# Patient Record
Sex: Female | Born: 1935
Health system: Southern US, Community
[De-identification: ages and names within clinical notes are randomized; demographics above are authoritative.]

## PROBLEM LIST (undated history)

## (undated) DIAGNOSIS — I10 Essential (primary) hypertension: Secondary | ICD-10-CM

## (undated) DIAGNOSIS — K589 Irritable bowel syndrome without diarrhea: Secondary | ICD-10-CM

## (undated) DIAGNOSIS — N189 Chronic kidney disease, unspecified: Secondary | ICD-10-CM

## (undated) DIAGNOSIS — F419 Anxiety disorder, unspecified: Secondary | ICD-10-CM

## (undated) DIAGNOSIS — G252 Other specified forms of tremor: Secondary | ICD-10-CM

## (undated) DIAGNOSIS — F329 Major depressive disorder, single episode, unspecified: Secondary | ICD-10-CM

## (undated) DIAGNOSIS — F32A Depression, unspecified: Secondary | ICD-10-CM

## (undated) DIAGNOSIS — E559 Vitamin D deficiency, unspecified: Secondary | ICD-10-CM

## (undated) DIAGNOSIS — E785 Hyperlipidemia, unspecified: Secondary | ICD-10-CM

## (undated) DIAGNOSIS — F0390 Unspecified dementia without behavioral disturbance: Secondary | ICD-10-CM

## (undated) DIAGNOSIS — G459 Transient cerebral ischemic attack, unspecified: Secondary | ICD-10-CM

## (undated) HISTORY — DX: Anxiety disorder, unspecified: F41.9

## (undated) HISTORY — DX: Depression, unspecified: F32.A

## (undated) HISTORY — DX: Vitamin D deficiency, unspecified: E55.9

## (undated) HISTORY — DX: Major depressive disorder, single episode, unspecified: F32.9

## (undated) HISTORY — DX: Essential (primary) hypertension: I10

## (undated) HISTORY — DX: Hyperlipidemia, unspecified: E78.5

## (undated) HISTORY — DX: Chronic kidney disease, unspecified: N18.9

## (undated) HISTORY — PX: OTHER SURGICAL HISTORY: SHX169

## (undated) HISTORY — DX: Other specified forms of tremor: G25.2

## (undated) HISTORY — DX: Irritable bowel syndrome, unspecified: K58.9

---

## 2007-08-08 DIAGNOSIS — M4800 Spinal stenosis, site unspecified: Secondary | ICD-10-CM | POA: Insufficient documentation

## 2010-08-08 DIAGNOSIS — F329 Major depressive disorder, single episode, unspecified: Secondary | ICD-10-CM | POA: Insufficient documentation

## 2010-08-08 DIAGNOSIS — I10 Essential (primary) hypertension: Secondary | ICD-10-CM | POA: Insufficient documentation

## 2010-08-08 DIAGNOSIS — F419 Anxiety disorder, unspecified: Secondary | ICD-10-CM | POA: Insufficient documentation

## 2010-08-08 DIAGNOSIS — K589 Irritable bowel syndrome without diarrhea: Secondary | ICD-10-CM | POA: Insufficient documentation

## 2010-09-20 DIAGNOSIS — E559 Vitamin D deficiency, unspecified: Secondary | ICD-10-CM | POA: Insufficient documentation

## 2011-03-04 DIAGNOSIS — Z8673 Personal history of transient ischemic attack (TIA), and cerebral infarction without residual deficits: Secondary | ICD-10-CM | POA: Insufficient documentation

## 2011-04-16 DIAGNOSIS — E785 Hyperlipidemia, unspecified: Secondary | ICD-10-CM | POA: Insufficient documentation

## 2011-09-17 DIAGNOSIS — I6529 Occlusion and stenosis of unspecified carotid artery: Secondary | ICD-10-CM | POA: Insufficient documentation

## 2011-09-17 DIAGNOSIS — I6523 Occlusion and stenosis of bilateral carotid arteries: Secondary | ICD-10-CM | POA: Insufficient documentation

## 2012-02-19 DIAGNOSIS — G252 Other specified forms of tremor: Secondary | ICD-10-CM | POA: Insufficient documentation

## 2012-05-20 DIAGNOSIS — K264 Chronic or unspecified duodenal ulcer with hemorrhage: Secondary | ICD-10-CM | POA: Insufficient documentation

## 2012-11-19 DIAGNOSIS — G250 Essential tremor: Secondary | ICD-10-CM | POA: Insufficient documentation

## 2012-11-19 DIAGNOSIS — E539 Vitamin B deficiency, unspecified: Secondary | ICD-10-CM | POA: Insufficient documentation

## 2012-12-04 DIAGNOSIS — L309 Dermatitis, unspecified: Secondary | ICD-10-CM | POA: Insufficient documentation

## 2013-03-17 DIAGNOSIS — L659 Nonscarring hair loss, unspecified: Secondary | ICD-10-CM | POA: Insufficient documentation

## 2013-08-22 ENCOUNTER — Emergency Department (HOSPITAL_BASED_OUTPATIENT_CLINIC_OR_DEPARTMENT_OTHER): Admission: EM | Admit: 2013-08-22 | Payer: Self-pay | Source: Home / Self Care

## 2013-11-30 DIAGNOSIS — N183 Chronic kidney disease, stage 3 (moderate): Secondary | ICD-10-CM

## 2013-11-30 DIAGNOSIS — N1831 Chronic kidney disease, stage 3a: Secondary | ICD-10-CM | POA: Insufficient documentation

## 2014-05-27 ENCOUNTER — Encounter: Payer: Self-pay | Admitting: Neurology

## 2014-06-20 DIAGNOSIS — K529 Noninfective gastroenteritis and colitis, unspecified: Secondary | ICD-10-CM | POA: Insufficient documentation

## 2014-07-05 ENCOUNTER — Other Ambulatory Visit: Payer: TRICARE For Life (TFL)

## 2014-07-05 ENCOUNTER — Encounter: Payer: Self-pay | Admitting: Neurology

## 2014-07-05 ENCOUNTER — Ambulatory Visit (INDEPENDENT_AMBULATORY_CARE_PROVIDER_SITE_OTHER): Payer: Medicare Other | Admitting: Neurology

## 2014-07-05 VITALS — HR 68 | Ht 65.6 in | Wt 124.0 lb

## 2014-07-05 DIAGNOSIS — E538 Deficiency of other specified B group vitamins: Secondary | ICD-10-CM | POA: Diagnosis not present

## 2014-07-05 DIAGNOSIS — G252 Other specified forms of tremor: Secondary | ICD-10-CM | POA: Diagnosis not present

## 2014-07-05 DIAGNOSIS — G609 Hereditary and idiopathic neuropathy, unspecified: Secondary | ICD-10-CM

## 2014-07-05 DIAGNOSIS — I6523 Occlusion and stenosis of bilateral carotid arteries: Secondary | ICD-10-CM

## 2014-07-05 NOTE — Progress Notes (Signed)
Samantha Crane was seen today in neurologic consultation at the request of Bill Salinas I, NP.    The patient is a 81 y.o. year old female with a history of orthostatic tremor and panic attacks.  The records that were made available to me were reviewed.  Pt previously saw [facility] and was last seen in 09/2013.    She has also been seen at [facility].    She states that if she stands up for 10 seconds and if she stands up straight she will have tremor in both legs.  If she stood still, she will have tremor in both hands and then will have face tremor but if she sits down the sx's go away almost immediately.  She states that if she walks it goes away.  Pt states that she was previously told that it was from spinal stenosis.  She then saw Dr. Estella Husk at [facility] and he told her that this was orthostatic tremor.  She really does not want treatment for this.  Independent of the above, her right leg aches in the AM and after it resolves, she will get "little shocks" in various places in the body, both arms and legs.  "I don't want any medication for the pricks but are there any exercises I can do?"   No falls but balance is not good.  She is having more trouble getting off the ground with gardening. She is on gabapentin and has been on it for years (2006-2007) ever since a sx for spinal stenosis.  She is on 300 mg tid.  She takes it at AM, lunch and bedtime.  She is not DM.    Record review: The patient had an episode in 2013 that was felt to be a TIA.  Her right hand was clumsy and her body was listing to the right.  She did not recall events for about 10 minutes according to the records (she only states today that her body was listing to the right) and she is completely normal after 30 minutes.  It was felt that this likely represented a TIA and she was placed on Aggrenox.  She states today that this was changed to Plavix after she had a bleeding ulcer as they did not want her  on aspirin component.  She states that she had carotids done and she had stenosis of 50% bilaterally, but notes indicate that she had repeat Dopplers after that that indicated 1-39% stenosis bilaterally.  She does not necessarily remember that.  Her records from [facility] indicate that she had symptoms consistent with panic attack in September, 2015 and not TIA and was referred to behavioral health.     ALLERGIES:   Allergies  Allergen Reactions  . Amoxicillin   . Sulfa Antibiotics     CURRENT MEDICATIONS:  Outpatient Encounter Prescriptions as of 07/05/2014  Medication Sig  . atorvastatin (LIPITOR) 10 MG tablet Take 10 mg by mouth daily.  . Calcium Carbonate-Vitamin D (CALCIUM + D PO) Take by mouth daily.  . cetirizine (ZYRTEC) 10 MG tablet Take 10 mg by mouth daily.  . citalopram (CELEXA) 20 MG tablet Take 20 mg by mouth daily.  . clopidogrel (PLAVIX) 75 MG tablet Take 75 mg by mouth daily.  . cyanocobalamin 500 MCG tablet Take 500 mcg by mouth daily.  Marland Kitchen estrogens, conjugated, (PREMARIN) 0.625 MG tablet Take 0.625 mg by mouth daily. Take daily for 21 days then do not take for 7 days.  . ferrous  sulfate 325 (65 FE) MG tablet Take 325 mg by mouth daily with breakfast.  . fluticasone (FLONASE) 50 MCG/ACT nasal spray Place into both nostrils daily.  Marland Kitchen gabapentin (NEURONTIN) 300 MG capsule Take 300 mg by mouth 3 (three) times daily.  Marland Kitchen lisinopril (PRINIVIL,ZESTRIL) 20 MG tablet Take 20 mg by mouth daily.  . Multiple Vitamin (MULTIVITAMIN) tablet Take 1 tablet by mouth daily.  . pantoprazole (PROTONIX) 40 MG tablet Take 40 mg by mouth daily.  . [DISCONTINUED] ALPRAZolam (XANAX) 0.25 MG tablet Take 0.25 mg by mouth at bedtime as needed for anxiety.   No facility-administered encounter medications on file as of 07/05/2014.    PAST MEDICAL HISTORY:   Past Medical History  Diagnosis Date  . Hypertension   . Chronic kidney disease     pt denies, states chronic bladder infections  .  Orthostatic tremor   . Vitamin D deficiency   . Hyperlipidemia   . Depression   . Anxiety   . IBS (irritable bowel syndrome)   . Hyperlipidemia     PAST SURGICAL HISTORY:   Past Surgical History  Procedure Laterality Date  . Lumbar spinal stenosis      SOCIAL HISTORY:   History   Social History  . Marital Status: Married    Spouse Name: N/A  . Number of Children: N/A  . Years of Education: N/A   Occupational History  . retired     Administrator, part time   Social History Main Topics  . Smoking status: Former Games developer  . Smokeless tobacco: Not on file     Comment: quit 30 years ago; social smoker  . Alcohol Use: 0.0 oz/week    0 Standard drinks or equivalent per week     Comment: rarely  . Drug Use: No  . Sexual Activity: Not on file   Other Topics Concern  . Not on file   Social History Narrative    FAMILY HISTORY:   Family Status  Relation Status Death Age  . Father Deceased     MI, heart disease  . Mother Deceased     stroke  . Brother Alive     CVA  . Child Alive     3, alive and well  . Child Deceased     2, miscarriages    ROS:  A complete 10 system review of systems was obtained and was unremarkable apart from what is mentioned above.  PHYSICAL EXAMINATION:    VITALS:   Filed Vitals:   07/05/14 1418  Pulse: 68  Height: 5' 5.6" (1.666 m)  Weight: 124 lb (56.246 kg)  SpO2: 98%    GEN:  Normal appears female in no acute distress.  Appears stated age. HEENT:  Normocephalic, atraumatic. The mucous membranes are moist. The superficial temporal arteries are without ropiness or tenderness. Cardiovascular: Regular rate and rhythm. Lungs: Clear to auscultation bilaterally. Neck/Heme: There are no carotid bruits noted bilaterally.  NEUROLOGICAL: Orientation:  The patient is alert and oriented x 3.  Fund of knowledge is appropriate.  Recent and remote memory intact.  Attention span and concentration normal.  Repeats and names without  difficulty. Cranial nerves: There is good facial symmetry. The pupils are equal round and reactive to light bilaterally. Fundoscopic exam reveals clear disc margins bilaterally. Extraocular muscles are intact and visual fields are full to confrontational testing. Speech is fluent and clear. Soft palate rises symmetrically and there is no tongue deviation. Hearing is intact to conversational tone. Tone: Tone is  good throughout. Sensation: Sensation is intact to light touch and pinprick throughout (facial, trunk, extremities).  Pinprick is decreased distally bilaterally in the lower extremities.  Vibration is markedly decreased in a distal fashion. There is no extinction with double simultaneous stimulation. There is no sensory dermatomal level identified. Coordination:  The patient has no difficulty with RAM's or FNF bilaterally. Motor: Strength is 5/5 in the bilateral upper and lower extremities.  Shoulder shrug is equal and symmetric. There is no pronator drift.  There are no fasciculations noted. DTR's: Deep tendon reflexes are 2/4 at the bilateral biceps, triceps, brachioradialis, patella and  absent at the bilateralachilles.  Plantar responses are downgoing bilaterally. Gait and Station: The patient is able to ambulate without difficulty. the patient has trouble ambulating in a tandem fashion.  She has give her walk on her toes, but has difficulty walking on her heels.  She sways in the Romberg position with eyes closed.     IMPRESSION/PLAN  1.  Mild gait instability  -She does have clinical evidence of a peripheral neuropathy.  She and I discussed safety at length.  Perhaps that is why she has some of the "pricking" that she describes in the morning.  Perhaps it is not aware during the daytime because she takes gabapentin.  -I did some lab work today to rule out any reversible causes including B12, TSH, SPEP/UPEP with immunofixation.  Patient states that she has already had basic lab work done,  including a chemistry and glucose.  She states that these were normal.  Her records indicate that she does have a history of B12 deficiency, so I will check that again today but it also looks like she has been getting injections for this. 2.  History of carotid stenosis  -I will get a carotid ultrasound. 3.  Orthostatic tremor  -I did not see any tremor at all today.  Based on her history alone, I am not sure that this represents orthostatic tremor, but nonetheless she wants no treatment for this.  She and I discussed that treatments for orthostatic tremor are incredibly limited and can cause significant side effects, especially of clonazepam is used in her age group.  She really has learned to deal with this over the years by just walking and then the symptoms go away. 4.  Anxiety  -I think much of this is situational.  She spent a significant amount of time talking about being the caregiver for her husband who has Parkinson's disease.  We talked about respite care.  Much greater than 50% of the 45 minute visit was spent in counseling. 5.  F/u prn

## 2014-07-06 NOTE — Progress Notes (Signed)
Note faxed to [phone].

## 2014-07-12 ENCOUNTER — Ambulatory Visit
Admission: RE | Admit: 2014-07-12 | Discharge: 2014-07-12 | Disposition: A | Discharge status: Home / Self Care | Payer: Medicare Other | Source: Ambulatory Visit | Attending: Neurology | Admitting: Neurology

## 2014-07-12 DIAGNOSIS — E538 Deficiency of other specified B group vitamins: Secondary | ICD-10-CM

## 2014-07-12 DIAGNOSIS — G252 Other specified forms of tremor: Secondary | ICD-10-CM

## 2014-12-15 DIAGNOSIS — J3089 Other allergic rhinitis: Secondary | ICD-10-CM | POA: Diagnosis not present

## 2014-12-16 DIAGNOSIS — J301 Allergic rhinitis due to pollen: Secondary | ICD-10-CM | POA: Diagnosis not present

## 2014-12-26 ENCOUNTER — Ambulatory Visit: Payer: Self-pay

## 2014-12-26 NOTE — Progress Notes (Signed)
Immunotherapy   Patient Details  Name: Samantha Crane MRN: [id number] Date of Birth: November 24, 1929  12/26/2014  Samantha Crane Pt. vials Red 1:100 (tree-grass-dog-cat and mite-weed) [facility] Following schedule: C  Frequency:1 time per week, at .50 q 2 weeks Epi-Pen:available. No problems with last vials. Consent signed and patient instructions given.   Virl SonDamita Nazir Hacker 12/26/2014, 9:52 AM

## 2015-01-06 DIAGNOSIS — J0100 Acute maxillary sinusitis, unspecified: Secondary | ICD-10-CM | POA: Insufficient documentation

## 2015-05-09 DIAGNOSIS — J3089 Other allergic rhinitis: Secondary | ICD-10-CM | POA: Diagnosis not present

## 2015-05-10 DIAGNOSIS — J301 Allergic rhinitis due to pollen: Secondary | ICD-10-CM

## 2015-05-16 ENCOUNTER — Ambulatory Visit: Payer: Medicare Other

## 2015-05-16 NOTE — Progress Notes (Signed)
Immunotherapy   Patient Details  Name: Samantha Crane MRN: [id number] Date of Birth: [birth year]  05/16/2015  Samantha Crane mailed out red 1/100 Mite-weed & Tree-Grass-Dog-Cat Following schedule: C at 0.50 every 2-3 weeks Frequency:1 time per week Epi-Pen:Epi-Pen Available  Consent signed and patient instructions given.  Records recd & had no problems.   Jacqulyn CaneJanet Damario Gillie 05/16/2015, 8:57 AM

## 2015-05-23 ENCOUNTER — Ambulatory Visit: Payer: Medicare Other

## 2015-06-05 DIAGNOSIS — F5101 Primary insomnia: Secondary | ICD-10-CM | POA: Insufficient documentation

## 2015-10-23 DIAGNOSIS — J3089 Other allergic rhinitis: Secondary | ICD-10-CM | POA: Diagnosis not present

## 2015-10-24 DIAGNOSIS — J301 Allergic rhinitis due to pollen: Secondary | ICD-10-CM | POA: Diagnosis not present

## 2015-10-24 NOTE — Progress Notes (Signed)
Immunotherapy   Patient Details  Name: Samantha Crane MRN: [id number] Date of Birth: [birth year]  10/24/2015  Samantha Crane Red 1:100 (Tree-Grass-Dog-Cat and Mite-Weed) @ .10 to start Following schedule: C  Frequency:1 time per week, at .50 Q 3 weeks Epi-Pen:Epi-Pen Available  Consent signed and patient instructions given. [facility]   Antonyo Hinderer 10/24/2015, 10:52 AM

## 2015-11-07 ENCOUNTER — Ambulatory Visit: Payer: Medicare Other

## 2015-11-29 ENCOUNTER — Encounter: Payer: Self-pay | Admitting: Allergy and Immunology

## 2015-11-29 ENCOUNTER — Ambulatory Visit (INDEPENDENT_AMBULATORY_CARE_PROVIDER_SITE_OTHER): Payer: Medicare Other | Admitting: Allergy and Immunology

## 2015-11-29 VITALS — BP 120/76 | HR 83 | Temp 97.9°F | Resp 16 | Ht 65.75 in | Wt 128.3 lb

## 2015-11-29 DIAGNOSIS — J3089 Other allergic rhinitis: Secondary | ICD-10-CM | POA: Diagnosis not present

## 2015-11-29 DIAGNOSIS — R059 Cough, unspecified: Secondary | ICD-10-CM | POA: Insufficient documentation

## 2015-11-29 DIAGNOSIS — Z91018 Allergy to other foods: Secondary | ICD-10-CM

## 2015-11-29 DIAGNOSIS — R05 Cough: Secondary | ICD-10-CM

## 2015-11-29 MED ORDER — EPINEPHRINE 0.3 MG/0.3ML IJ SOAJ: 0.3000 mg | Freq: Once | INTRAMUSCULAR | 2 refills | Status: AC

## 2015-11-29 MED ORDER — ALBUTEROL SULFATE HFA 108 (90 BASE) MCG/ACT IN AERS: 2.0000 | INHALATION_SPRAY | RESPIRATORY_TRACT | 1 refills | Status: DC | PRN

## 2015-11-29 MED ORDER — IPRATROPIUM BROMIDE 0.06 % NA SOLN: 2.0000 | Freq: Three times a day (TID) | NASAL | 5 refills | Status: DC

## 2015-11-29 MED ORDER — IPRATROPIUM BROMIDE 0.06 % NA SOLN
2.0000 | Freq: Three times a day (TID) | NASAL | Status: DC
Start: 2015-11-29 — End: 2015-11-29

## 2015-11-29 NOTE — Assessment & Plan Note (Signed)
The patient's history and physical examination suggest upper airway cough syndrome.  Spirometry today does not demonstrate significant reversibility. We will aggressively treat postnasal drainage and evaluate results.  Treatment plan as outlined below for allergic rhinitis.  If the coughing persists or progresses despite this plan, we will evaluate further.

## 2015-11-29 NOTE — Progress Notes (Signed)
Follow-up Note  RE: Samantha Crane [place] MRN: [id number] DOB: 05/02/1929 Date of Office Visit: 11/29/2015  Primary care provider: Vernon Preyurbyfill, Holly I, NP Referring provider: Bill Salinasurbyfill, Holly I, NP  History of present illness: Samantha Crane Stur[place] is a 82 y.o. female allergic rhinitis and food allergies presenting today for sick visit.  She was last seen in this clinic in May 2015  by Dr. Clydie BraunBhatti who has since left the practice.  She complains that she has recently been experiencing "constant" rhinorrhea, thick postnasal drainage, nasal congestion, as well as occasional sinus pressure and ear pressure.  In addition, she has been experiencing a persistent cough.  She reports that she is treated with antibiotics for sinus infections 3 times per year on average.  She has not been experiencing fevers, chills, or discolored mucus production.   Assessment and plan: Cough The patient's history and physical examination suggest upper airway cough syndrome.  Spirometry today does not demonstrate significant reversibility. We will aggressively treat postnasal drainage and evaluate results.  Treatment plan as outlined below for allergic rhinitis.  If the coughing persists or progresses despite this plan, we will evaluate further.  Allergic rhinitis  A prescription has been provided for ipratropium 0.06% nasal spray, 2 sprays per nostril 2 or 3 times daily as needed.  Nasal saline lavage (NeilMed) as needed has been recommended prior to medicated nasal sprays and as needed along with instructions for proper administration.  For thick post nasal drainage, add guaifenesin 600 mg (Mucinex)  twice daily as needed with adequate hydration as discussed.  History of food allergy  She had apparently developed hives in association on one occasion while consuming crabs in the past, however she apparently consumes other shellfish on a regular basis without adverse symptoms. Continue avoidance of crabs and have access to  epinephrine autoinjectors in case of accidental ingestion followed by systemic symptoms.  She chooses to continue consumption of other shellfish.   Meds ordered this encounter  Medications  . albuterol (PROVENTIL HFA;VENTOLIN HFA) 108 (90 Base) MCG/ACT inhaler    Sig: Inhale 2 puffs into the lungs every 4 (four) hours as needed.    Dispense:  1 Inhaler    Refill:  1  . ipratropium (ATROVENT) 0.06 % nasal spray 2 spray  . EPINEPHrine (EPIPEN 2-PAK) 0.3 mg/0.3 mL IJ SOAJ injection    Sig: Inject 0.3 mLs (0.3 mg total) into the muscle once.    Dispense:  2 Device    Refill:  2    Diagnostics: Spirometry reveals an FVC of 2.18 L (81% predicted) and an FEV1 of 1.38 L (69% predicted) without significant post bronchodilator improvement.  Mild airways obstruction without reversibility.  Please see scanned spirometry results for details.    Physical examination: Blood pressure 120/76, pulse 83, temperature 97.9 F (36.6 C), temperature source Oral, resp. rate 16, height 5' 5.75" (1.67 m), weight 128 lb 4.9 oz (58.2 kg), SpO2 97 %.  General: Alert, interactive, in no acute distress. HEENT: TMs pearly gray, turbinates edematous with clear discharge, post-pharynx erythematous. Neck: Supple without lymphadenopathy. Lungs: Mildly decreased breath sounds bilaterally without wheezing, rhonchi or rales. CV: Normal S1, S2 without murmurs. Skin: Warm and dry, without lesions or rashes.  The following portions of the patient's history were reviewed and updated as appropriate: allergies, current medications, past family history, past medical history, past social history, past surgical history and problem list.    Medication List       Accurate as of 11/29/15  1:38 PM.  Always use your most recent med list.          albuterol 108 (90 Base) MCG/ACT inhaler Commonly known as:  PROVENTIL HFA;VENTOLIN HFA Inhale 2 puffs into the lungs every 4 (four) hours as needed.   atorvastatin 10 MG  tablet Commonly known as:  LIPITOR Take 10 mg by mouth daily.   buPROPion 150 MG 24 hr tablet Commonly known as:  WELLBUTRIN XL Take 150 mg by mouth.   CALCIUM + D PO Take by mouth daily.   calcium-vitamin D 250-125 MG-UNIT tablet Commonly known as:  OSCAL WITH D Take by mouth.   cetirizine 10 MG tablet Commonly known as:  ZYRTEC Take 10 mg by mouth daily.   cholestyramine 4 g packet Commonly known as:  QUESTRAN MIX ONE PACKET AND DRINK TWICE A DAY WITH MEALS   citalopram 20 MG tablet Commonly known as:  CELEXA Take 20 mg by mouth daily.   clopidogrel 75 MG tablet Commonly known as:  PLAVIX Take 75 mg by mouth daily.   cyanocobalamin 500 MCG tablet Take 500 mcg by mouth daily.   EPINEPHrine 0.3 mg/0.3 mL Soaj injection Commonly known as:  EPIPEN 2-PAK Inject 0.3 mLs (0.3 mg total) into the muscle once.   estrogens (conjugated) 0.625 MG tablet Commonly known as:  PREMARIN Take 0.625 mg by mouth daily. Take daily for 21 days then do not take for 7 days.   ferrous sulfate 325 (65 FE) MG tablet Take 325 mg by mouth daily with breakfast.   fluticasone 27.5 MCG/SPRAY nasal spray Commonly known as:  VERAMYST Place into the nose.   fluticasone 50 MCG/ACT nasal spray Commonly known as:  FLONASE Place into both nostrils daily.   gabapentin 300 MG capsule Commonly known as:  NEURONTIN Take 300 mg by mouth 3 (three) times daily.   lisinopril 20 MG tablet Commonly known as:  PRINIVIL,ZESTRIL Take 20 mg by mouth daily.   montelukast 10 MG tablet Commonly known as:  SINGULAIR Take 10 mg by mouth at bedtime.   MULTI-VITAMINS Tabs Take by mouth.   NON FORMULARY Allergy injection   pantoprazole 40 MG tablet Commonly known as:  PROTONIX Take 40 mg by mouth daily.   PREMARIN vaginal cream Generic drug:  conjugated estrogens INSERT 0.5 GRAM INTO THE VAGINA THREE TIMES A WEEK   zaleplon 10 MG capsule Commonly known as:  SONATA TAKE ONE CAPSULE BY MOUTH AT  BEDTIME AS NEEDED       Allergies  Allergen Reactions  . Prednisone     Other reaction(s): Other (See Comments) Manic tendencies  . Amoxicillin   . Sulfa Antibiotics    Review of systems: Review of systems negative except as noted in HPI / PMHx or noted below: Constitutional: Negative.  HENT: Negative.   Eyes: Negative.  Respiratory: Negative.   Cardiovascular: Negative.  Gastrointestinal: Negative.  Genitourinary: Negative.  Musculoskeletal: Negative.  Neurological: Negative.  Endo/Heme/Allergies: Negative.  Cutaneous: Negative.  Past Medical History:  Diagnosis Date  . Anxiety   . Chronic kidney disease    pt denies, states chronic bladder infections  . Depression   . Hyperlipidemia   . Hyperlipidemia   . Hypertension   . IBS (irritable bowel syndrome)   . Orthostatic tremor   . Vitamin D deficiency     History reviewed. No pertinent family history.  Social History   Social History  . Marital status: Married    Spouse name: N/A  . Number of children: N/A  . Years  of education: N/A   Occupational History  . retired     Administrator, part time   Social History Main Topics  . Smoking status: Former Games developer  . Smokeless tobacco: Never Used     Comment: quit 30 years ago; social smoker  . Alcohol use 0.0 oz/week     Comment: rarely  . Drug use: No  . Sexual activity: Not on file   Other Topics Concern  . Not on file   Social History Narrative  . No narrative on file    I appreciate the opportunity to take part in Delena's care. Please do not hesitate to contact me with questions.  Sincerely,   R. Jorene Guest, MD

## 2015-11-29 NOTE — Addendum Note (Signed)
Addended by: Maryjean MornFREEMAN, Marcellino Fidalgo D on: 11/29/2015 02:36 PM   Modules accepted: Orders

## 2015-11-29 NOTE — Assessment & Plan Note (Signed)
   She had apparently developed hives in association on one occasion while consuming crabs in the past, however she apparently consumes other shellfish on a regular basis without adverse symptoms. Continue avoidance of crabs and have access to epinephrine autoinjectors in case of accidental ingestion followed by systemic symptoms.  She chooses to continue consumption of other shellfish.

## 2015-11-29 NOTE — Patient Instructions (Addendum)
Cough The patient's history and physical examination suggest upper airway cough syndrome.  Spirometry today does not demonstrate significant reversibility. We will aggressively treat postnasal drainage and evaluate results.  Treatment plan as outlined below for allergic rhinitis.  If the coughing persists or progresses despite this plan, we will evaluate further.  Allergic rhinitis  A prescription has been provided for ipratropium 0.06% nasal spray, 2 sprays per nostril 2 or 3 times daily as needed.  Nasal saline lavage (NeilMed) as needed has been recommended prior to medicated nasal sprays and as needed along with instructions for proper administration.  For thick post nasal drainage, add guaifenesin 600 mg (Mucinex)  twice daily as needed with adequate hydration as discussed.  History of food allergy  She had apparently developed hives in association on one occasion while consuming crabs in the past, however she apparently consumes other shellfish on a regular basis without adverse symptoms. Continue avoidance of crabs and have access to epinephrine autoinjectors in case of accidental ingestion followed by systemic symptoms.  She chooses to continue consumption of other shellfish.   Return in about 4 months (around 03/28/2016), or if symptoms worsen or fail to improve.

## 2015-11-29 NOTE — Assessment & Plan Note (Signed)
   A prescription has been provided for ipratropium 0.06% nasal spray, 2 sprays per nostril 2 or 3 times daily as needed.  Nasal saline lavage (NeilMed) as needed has been recommended prior to medicated nasal sprays and as needed along with instructions for proper administration.  For thick post nasal drainage, add guaifenesin 600 mg (Mucinex)  twice daily as needed with adequate hydration as discussed.

## 2016-02-09 DIAGNOSIS — J3089 Other allergic rhinitis: Secondary | ICD-10-CM | POA: Insufficient documentation

## 2016-02-09 DIAGNOSIS — Z9109 Other allergy status, other than to drugs and biological substances: Secondary | ICD-10-CM | POA: Insufficient documentation

## 2016-03-21 ENCOUNTER — Emergency Department (HOSPITAL_COMMUNITY)
Admission: EM | Admit: 2016-03-21 | Discharge: 2016-03-22 | Disposition: A | Discharge status: Home / Self Care | Payer: Medicare Other | Attending: Emergency Medicine | Admitting: Emergency Medicine

## 2016-03-21 ENCOUNTER — Encounter (HOSPITAL_COMMUNITY): Payer: Self-pay

## 2016-03-21 ENCOUNTER — Emergency Department (HOSPITAL_COMMUNITY): Payer: Medicare Other

## 2016-03-21 DIAGNOSIS — N189 Chronic kidney disease, unspecified: Secondary | ICD-10-CM | POA: Insufficient documentation

## 2016-03-21 DIAGNOSIS — F410 Panic disorder [episodic paroxysmal anxiety] without agoraphobia: Secondary | ICD-10-CM | POA: Diagnosis not present

## 2016-03-21 DIAGNOSIS — Z87891 Personal history of nicotine dependence: Secondary | ICD-10-CM | POA: Insufficient documentation

## 2016-03-21 DIAGNOSIS — I129 Hypertensive chronic kidney disease with stage 1 through stage 4 chronic kidney disease, or unspecified chronic kidney disease: Secondary | ICD-10-CM | POA: Insufficient documentation

## 2016-03-21 LAB — CBC WITH DIFFERENTIAL/PLATELET
BASOS PCT: 1 %
Basophils Absolute: 0 10*3/uL (ref 0.0–0.1)
EOS ABS: 0.1 10*3/uL (ref 0.0–0.7)
Eosinophils Relative: 2 %
HEMATOCRIT: 39.7 % (ref 36.0–46.0)
HEMOGLOBIN: 13.7 g/dL (ref 12.0–15.0)
LYMPHS ABS: 2.1 10*3/uL (ref 0.7–4.0)
Lymphocytes Relative: 28 %
MCH: 30.5 pg (ref 26.0–34.0)
MCHC: 34.5 g/dL (ref 30.0–36.0)
MCV: 88.4 fL (ref 78.0–100.0)
MONOS PCT: 10 %
Monocytes Absolute: 0.8 10*3/uL (ref 0.1–1.0)
NEUTROS ABS: 4.6 10*3/uL (ref 1.7–7.7)
NEUTROS PCT: 59 %
Platelets: 218 10*3/uL (ref 150–400)
RBC: 4.49 MIL/uL (ref 3.87–5.11)
RDW: 13.4 % (ref 11.5–15.5)
WBC: 7.6 10*3/uL (ref 4.0–10.5)

## 2016-03-21 LAB — I-STAT TROPONIN, ED
TROPONIN I, POC: 0.01 ng/mL (ref 0.00–0.08)
Troponin i, poc: 0 ng/mL (ref 0.00–0.08)

## 2016-03-21 LAB — BASIC METABOLIC PANEL
Anion gap: 8 (ref 5–15)
BUN: 20 mg/dL (ref 6–20)
CHLORIDE: 97 mmol/L — AB (ref 101–111)
CO2: 28 mmol/L (ref 22–32)
Calcium: 9.4 mg/dL (ref 8.9–10.3)
Creatinine, Ser: 0.87 mg/dL (ref 0.44–1.00)
GFR calc non Af Amer: 59 mL/min — ABNORMAL LOW (ref >60)
Glucose, Bld: 108 mg/dL — ABNORMAL HIGH (ref 65–99)
POTASSIUM: 4.6 mmol/L (ref 3.5–5.1)
SODIUM: 133 mmol/L — AB (ref 135–145)

## 2016-03-21 NOTE — ED Triage Notes (Signed)
Patient states having a panic attack today.  Patient states being under a lot of stress due to guilt of having to put husband in a nursing home.  Patient states having a frequent panic attacks and disliking taking Xanax.  Patient sees a councilor due to the stress leading to the panic attacks.  Patient states "I wasn't a good enough person to take care of him."

## 2016-03-21 NOTE — ED Provider Notes (Signed)
[facility]-EMERGENCY DEPT Provider Note   CSN: [id number] Arrival date & time: 03/21/16  1540  By signing my name below, I, Samantha Crane, attest that this documentation has been prepared under the direction and in the presence of [name], [name]. Electronically Signed: Alyssa GroveMartin Crane, ED Scribe. 03/21/16. 11:58 PM.   History   Chief Complaint Chief Complaint  Patient presents with  . Panic Attack    The history is provided by the patient and a relative (Daughter). No language interpreter was used.   HPI Comments: Samantha Crane is a 83 y.o. female with PMHx of HLD, HTN, TIA (on plavix) and CKD who presents to the Emergency Department complaining of recurrent, gradual onset, episodic panic attacks for 2 1/2 weeks with the most recent episode today at 3 PM. She has been placed under increased stress after her husband was placed in Hospice a few weeks ago. Pt has [facility] with moderate relief, but states it frequently takes another half of a Xanax tablet for full relief. Reports sxs resolved within 30 minutes of onset. She reports associated shortness of breath, generalized chest pressure, decreased appetite, and tremors. [facility]  [facility] advised to come to the ED for "psych evaluation". Pt denies headaches, acute visual changes, cough, extremity weakness, slurred speech, facial asymmetry/weakness, recent falls or head injuries. Patient denies any symptoms or complaints at this time.   Past Medical History:  Diagnosis Date  . Anxiety   . Chronic kidney disease    pt denies, states chronic bladder infections  . Depression   . Hyperlipidemia   . Hyperlipidemia   . Hypertension   . IBS (irritable bowel syndrome)   . Orthostatic tremor   . Vitamin D deficiency     Patient Active Problem List   Diagnosis Date Noted  . Allergic rhinitis 11/29/2015  . Cough 11/29/2015  . History of food  allergy 11/29/2015    Past Surgical History:  Procedure Laterality Date  . lumbar spinal stenosis      OB History    No data available       Home Medications    Prior to Admission medications   Medication Sig Start Date End Date Taking? Authorizing Provider  albuterol (PROVENTIL HFA;VENTOLIN HFA) 108 (90 Base) MCG/ACT inhaler Inhale 2 puffs into the lungs every 4 (four) hours as needed. 11/29/15   Cristal Fordalph Carter Bobbitt, MD  atorvastatin (LIPITOR) 10 MG tablet Take 10 mg by mouth daily.    Historical Provider, MD  buPROPion (WELLBUTRIN XL) 150 MG 24 hr tablet Take 150 mg by mouth. 09/22/15 09/21/16  Historical Provider, MD  Calcium Carbonate-Vitamin D (CALCIUM + D PO) Take by mouth daily.    Historical Provider, MD  calcium-vitamin D (OSCAL WITH D) 250-125 MG-UNIT tablet Take by mouth.    Historical Provider, MD  cetirizine (ZYRTEC) 10 MG tablet Take 10 mg by mouth daily.    Historical Provider, MD  cholestyramine (QUESTRAN) 4 g packet MIX ONE PACKET AND DRINK TWICE A DAY WITH MEALS 07/17/15   Historical Provider, MD  citalopram (CELEXA) 20 MG tablet Take 20 mg by mouth daily.    Historical Provider, MD  clopidogrel (PLAVIX) 75 MG tablet Take 75 mg by mouth daily.    Historical Provider, MD  conjugated estrogens (PREMARIN) vaginal cream INSERT 0.5 GRAM INTO THE VAGINA THREE TIMES A WEEK 06/24/14   Historical Provider, MD  cyanocobalamin 500 MCG  tablet Take 500 mcg by mouth daily.    Historical Provider, MD  estrogens, conjugated, (PREMARIN) 0.625 MG tablet Take 0.625 mg by mouth daily. Take daily for 21 days then do not take for 7 days.    Historical Provider, MD  ferrous sulfate 325 (65 FE) MG tablet Take 325 mg by mouth daily with breakfast.    Historical Provider, MD  fluticasone (FLONASE) 50 MCG/ACT nasal spray Place into both nostrils daily.    Historical Provider, MD  fluticasone (VERAMYST) 27.5 MCG/SPRAY nasal spray Place into the nose. 05/18/12   Historical Provider, MD  gabapentin  (NEURONTIN) 300 MG capsule Take 300 mg by mouth 3 (three) times daily.    Historical Provider, MD  ipratropium (ATROVENT) 0.06 % nasal spray Place 2 sprays into both nostrils 3 (three) times daily. 11/29/15   Cristal Ford, MD  lisinopril (PRINIVIL,ZESTRIL) 20 MG tablet Take 20 mg by mouth daily.    Historical Provider, MD  montelukast (SINGULAIR) 10 MG tablet Take 10 mg by mouth at bedtime.    Historical Provider, MD  Multiple Vitamin (MULTI-VITAMINS) TABS Take by mouth.    Historical Provider, MD  NON FORMULARY Allergy injection    Historical Provider, MD  pantoprazole (PROTONIX) 40 MG tablet Take 40 mg by mouth daily.    Historical Provider, MD  zaleplon (SONATA) 10 MG capsule TAKE ONE CAPSULE BY MOUTH AT BEDTIME AS NEEDED 05/18/12   Historical Provider, MD    Family History History reviewed. No pertinent family history.  Social History Social History  Substance Use Topics  . Smoking status: Former Games developer  . Smokeless tobacco: Never Used     Comment: quit 30 years ago; social smoker  . Alcohol use No     Comment: rarely   Allergies   Prednisone; Amoxicillin; and Sulfa antibiotics  Review of Systems Review of Systems  Constitutional: Positive for appetite change.  HENT:       No head injuries  Eyes: Negative for visual disturbance.  Respiratory: Positive for shortness of breath.   Cardiovascular: Positive for chest pain.  Neurological: Positive for tremors. Negative for facial asymmetry, speech difficulty, weakness and headaches.  Psychiatric/Behavioral: The patient is nervous/anxious.        +Panic attacks  All other systems reviewed and are negative.   Physical Exam Updated Vital Signs BP 130/69 (BP Location: Right Arm)   Pulse 72   Temp 97.9 F (36.6 C) (Oral)   Resp 18   Ht 5' 5.5" (1.664 m)   Wt 50.8 kg   SpO2 99%   BMI 18.35 kg/m   Physical Exam  Constitutional: She is oriented to person, place, and time. She appears well-developed and well-nourished.  No distress.  HENT:  Head: Normocephalic and atraumatic.  Mouth/Throat: Oropharynx is clear and moist. No oropharyngeal exudate.  Eyes: Conjunctivae and EOM are normal. Pupils are equal, round, and reactive to light. Right eye exhibits no discharge. Left eye exhibits no discharge. No scleral icterus.  Neck: Normal range of motion. Neck supple.  Cardiovascular: Normal rate, regular rhythm, normal heart sounds and intact distal pulses.   Pulmonary/Chest: Effort normal and breath sounds normal. No respiratory distress. She has no wheezes. She has no rales. She exhibits no tenderness.  Abdominal: Soft. Bowel sounds are normal. She exhibits no distension and no mass. There is no tenderness. There is no rebound and no guarding.  Musculoskeletal: Normal range of motion. She exhibits no edema.  No midline C, T, or L tenderness. Full range  of motion of neck and back. Full range of motion of bilateral upper and lower extremities, with 5/5 strength. Sensation intact. 2+ radial and PT pulses. Cap refill <2 seconds. Patient able to stand and ambulate without assistance.    Neurological: She is alert and oriented to person, place, and time. She has normal strength. No cranial nerve deficit or sensory deficit. Coordination normal.  Skin: Skin is warm and dry. Capillary refill takes less than 2 seconds. She is not diaphoretic.  Psychiatric:  Depressed mood  Nursing note and vitals reviewed.   ED Treatments / Results  DIAGNOSTIC STUDIES: Oxygen Saturation is 96% on RA, adequate by my interpretation.    COORDINATION OF CARE: 7:22 PM Discussed treatment plan with pt at bedside which includes consult with attending physician and pt agreed to plan. Pt does not wish to be admitted as an outpatient.  Labs (all labs ordered are listed, but only abnormal results are displayed) Labs Reviewed  BASIC METABOLIC PANEL - Abnormal; Notable for the following:       Result Value   Sodium 133 (*)    Chloride 97 (*)     Glucose, Bld 108 (*)    GFR calc non Af Amer 59 (*)    All other components within normal limits  CBC WITH DIFFERENTIAL/PLATELET  I-STAT TROPOININ, ED  Rosezena Sensor, ED    EKG  EKG Interpretation None       Radiology Dg Chest 2 View  Result Date: 03/21/2016 CLINICAL DATA:  83 y/o  F; chest pain and panic attacks. EXAM: CHEST  2 VIEW COMPARISON:  None available. FINDINGS: Normal cardiac silhouette. Aortic atherosclerosis with calcification. No focal consolidation. Hyperinflated lungs with flattened diaphragms may representing underlying COPD. No pleural effusion or pneumothorax. Bones are unremarkable. IMPRESSION: No acute cardiopulmonary process. Aortic atherosclerosis. Hyperinflated lungs may represent underlying COPD. Electronically Signed   By: Mitzi Hansen M.D.   On: 03/21/2016 20:28    Procedures Procedures (including critical care time)  Medications Ordered in ED Medications - No data to display   Initial Impression / Assessment and Plan / ED Course  I have reviewed the triage vital signs and the nursing notes.  Pertinent labs & imaging results that were available during my care of the patient were reviewed by me and considered in my medical decision making (see chart for details).    Patient presents to the ED with complaint of panic attacks. Patient states she has been having panic attacks almost daily for the past 2 and half weeks. Patient states the panic attacks started after her husband went to hospice. She reports episodes typically last for less than 30 minutes and resolved with Xanax which she was recently prescribed by her primary care provider. Denies any pain or complaints at this time. VSS. Exam unremarkable. No neuro deficits. EKG showed sinus rhythm. Troponin negative. Labs unremarkable. Chest x-ray negative. Discussed pt with Dr. Dalene Seltzer. Patient episodes appear to be consistent with panic attacks related to recent increased stress from her  husband recently going into hospice. Patient has remained asymptomatic while in the ED. Plan to ordered delta troponin for complete cardiac evaluation. Delta troponin negative. I have a low suspicion for ACS, PE, dissection, or other acute cardiac event at this time. Discussed results and plan for discharge with patient. Advised patient to continue taking her Xanax as prescribed as needed for panic attacks. Advised patient to follow up with her primary care provider in the next 3-4 days. Discussed or return precautions.  I personally performed the services described in this documentation, which was scribed in my presence. The recorded information has been reviewed and is accurate.   Final Clinical Impressions(s) / ED Diagnoses   Final diagnoses:  Panic attack    New Prescriptions New Prescriptions   No medications on file     Barrett Henle, [name] 03/21/16 2358    Alvira Monday, MD 03/29/16 [phone]

## 2016-03-21 NOTE — ED Notes (Signed)
Patient states "Hospice has been brought in because my husband has parkinsons and dementia and getting worst. I feel bad because I couldn't take care of him at home but he would walk without his walker and fall down and I just wanted him to be safe and his doctor's felt it was for the best." She states "I feel bad and it upsets me that I had to do this to him to the point I was spending all day with him and then I was wearing myself out and realized it just wasn't going to work with me continuing to do this. It eventually gave me anxiety because I was worried about him and what others thought." Daugter at the bedside and states "when she began worrying she starts having panic attacks to were she hyperventilates, shakes all over, and is hard to calm down. She was at her PCP today and had an attack and he is the one that said she needed to come here and be worked up for a psychiatric evaluation. She has been taking Xanax which he prescribed for her but it seems as though she is having them more frequently. She [facility]inary [facility] but has only seen them twice."

## 2016-03-21 NOTE — Discharge Instructions (Signed)
Continue taking your home pain medications as prescribed including your Xanax as needed for anxiety/panic attacks. I recommend following up with your primary care provider within the next 3-4 days for follow-up evaluation regarding your panic attacks. Please return to the Emergency Department if symptoms worsen or new onset of fever, headache, visual changes, dizziness, difficulty breathing, chest pain, numbness, weakness, abdominal pain, vomiting, slurred speech, facial weakness, altered mental status.

## 2016-03-21 NOTE — ED Triage Notes (Signed)
Per EMS patient is in an independent living facility ([facility]) and was told her husband was going to Hospice.  Due to this patient experienced a panic attack.  Patient told EMS current anxiety medication were not working.  Zanax was given at 1445.  EMS stated they were able to calm her down after giving the Zanax.

## 2016-03-22 NOTE — ED Notes (Addendum)
Pt and family left without dc education.  They were already getting ready to leave when this nurse went in to give the daughter d/c instructions.  Left without signing and was not able to obtain d/c VS

## 2016-04-01 NOTE — Addendum Note (Signed)
Addended by: Berna BueWHITAKER, Prabhleen Montemayor L on: 04/01/2016 09:12 AM   Modules accepted: Orders

## 2016-05-02 ENCOUNTER — Ambulatory Visit: Payer: Self-pay

## 2016-05-06 NOTE — Progress Notes (Signed)
Vials to be made 05-07-16.  jm 

## 2016-05-07 DIAGNOSIS — J3089 Other allergic rhinitis: Secondary | ICD-10-CM | POA: Diagnosis not present

## 2016-05-08 DIAGNOSIS — J3081 Allergic rhinitis due to animal (cat) (dog) hair and dander: Secondary | ICD-10-CM | POA: Diagnosis not present

## 2016-05-20 DIAGNOSIS — J309 Allergic rhinitis, unspecified: Secondary | ICD-10-CM

## 2016-05-20 NOTE — Progress Notes (Unsigned)
Immunotherapy   Patient Details  Name: Samantha Crane MRN: [id number] Date of Birth: 08/09/1929  05/20/2016  Samantha Crane  Mailed out vials today to [facility] Following schedule: C  Frequency:ONCE A WEEK Epi-Pen:YES Consent signed and patient instructions given.   Laury AxonCarrie L Whitaker 05/20/2016, 12:12 PM

## 2016-05-29 ENCOUNTER — Ambulatory Visit (INDEPENDENT_AMBULATORY_CARE_PROVIDER_SITE_OTHER): Payer: Medicare Other | Admitting: Allergy and Immunology

## 2016-05-29 ENCOUNTER — Encounter: Payer: Self-pay | Admitting: Allergy and Immunology

## 2016-05-29 VITALS — BP 110/70 | HR 80 | Temp 97.7°F | Resp 18

## 2016-05-29 DIAGNOSIS — J3089 Other allergic rhinitis: Secondary | ICD-10-CM

## 2016-05-29 DIAGNOSIS — R05 Cough: Secondary | ICD-10-CM | POA: Diagnosis not present

## 2016-05-29 DIAGNOSIS — R059 Cough, unspecified: Secondary | ICD-10-CM

## 2016-05-29 MED ORDER — IPRATROPIUM BROMIDE 0.06 % NA SOLN: 2.0000 | Freq: Three times a day (TID) | NASAL | 5 refills | Status: DC

## 2016-05-29 NOTE — Progress Notes (Signed)
Follow-up Note  RE: Samantha Crane Jachim MRN: [id number] DOB: 03-21-1929 Date of Office Visit: 05/29/2016  Primary care provider: Vernon Preyurbyfill, Holly I, NP Referring provider: Bill Salinasurbyfill, Holly I, NP  History of present illness: Samantha Crane Napierala is a 83 y.o. female with allergic rhinitis, food allergy, and history of persistent cough presenting today for follow up.  She reports that her allergy symptoms have been well-controlled until she ran out of her allergy medications for approximately 3 weeks.  She uses a NeilMed nasal saline lavage every morning with significant benefit.     Assessment and plan: Allergic rhinitis Stable on current treatment plan.  Continue appropriate allergen avoidance measures, ipratropium 0.06% nasal spray as needed, nasal saline lavage as needed, and guaifenesin if needed.  Cough Improved.  Most likely due to upper airway cough syndrome.  Treatment plan as outlined above.   Meds ordered this encounter  Medications  . ipratropium (ATROVENT) 0.06 % nasal spray    Sig: Place 2 sprays into both nostrils 3 (three) times daily.    Dispense:  15 mL    Refill:  5    Diagnostics: Spirometry:  Normal with an FEV1 of 85% predicted.  Please see scanned spirometry results for details.    Physical examination: Blood pressure 110/70, pulse 80, temperature 97.7 F (36.5 C), temperature source Oral, resp. rate 18, SpO2 95 %.  General: Alert, interactive, in no acute distress. HEENT: TMs pearly gray, turbinates mildly edematous without discharge, post-pharynx mildly erythematous. Neck: Supple without lymphadenopathy. Lungs: Clear to auscultation without wheezing, rhonchi or rales. CV: Normal S1, S2 without murmurs. Skin: Warm and dry, without lesions or rashes.  The following portions of the patient's history were reviewed and updated as appropriate: allergies, current medications, past family history, past medical history, past social history, past surgical history and  problem list.  Allergies as of 05/29/2016      Reactions   Prednisone    Other reaction(s): Other (See Comments) Manic tendencies   Amoxicillin    Sulfa Antibiotics       Medication List       Accurate as of 05/29/16 12:14 PM. Always use your most recent med list.          albuterol 108 (90 Base) MCG/ACT inhaler Commonly known as:  PROVENTIL HFA;VENTOLIN HFA Inhale 2 puffs into the lungs every 4 (four) hours as needed.   atorvastatin 10 MG tablet Commonly known as:  LIPITOR Take 10 mg by mouth daily.   buPROPion 150 MG 24 hr tablet Commonly known as:  WELLBUTRIN XL Take 150 mg by mouth daily.   CALCIUM + D PO Take by mouth daily.   calcium-vitamin D 250-125 MG-UNIT tablet Commonly known as:  OSCAL WITH D Take 1 tablet by mouth daily.   cetirizine 10 MG tablet Commonly known as:  ZYRTEC Take 10 mg by mouth daily.   cholestyramine 4 g packet Commonly known as:  QUESTRAN MIX ONE PACKET AND DRINK TWICE A DAY WITH MEALS   citalopram 20 MG tablet Commonly known as:  CELEXA Take 20 mg by mouth daily.   clonazePAM 0.5 MG tablet Commonly known as:  KLONOPIN Take 0.5 mg by mouth 2 (two) times daily as needed for anxiety.   clopidogrel 75 MG tablet Commonly known as:  PLAVIX Take 75 mg by mouth daily.   cyanocobalamin 500 MCG tablet Take 500 mcg by mouth daily.   estrogens (conjugated) 0.625 MG tablet Commonly known as:  PREMARIN Take 0.625 mg by mouth daily.  Take daily for 21 days then do not take for 7 days.   ferrous sulfate 325 (65 FE) MG tablet Take 325 mg by mouth daily with breakfast.   fluticasone 27.5 MCG/SPRAY nasal spray Commonly known as:  VERAMYST Place into the nose.   fluticasone 50 MCG/ACT nasal spray Commonly known as:  FLONASE Place into both nostrils daily.   gabapentin 300 MG capsule Commonly known as:  NEURONTIN Take 300 mg by mouth 3 (three) times daily.   ipratropium 0.06 % nasal spray Commonly known as:  ATROVENT Place 2  sprays into both nostrils 3 (three) times daily.   lisinopril 20 MG tablet Commonly known as:  PRINIVIL,ZESTRIL Take 20 mg by mouth daily.   metroNIDAZOLE 1 % gel Commonly known as:  METROGEL Apply topically daily.   montelukast 10 MG tablet Commonly known as:  SINGULAIR Take 10 mg by mouth at bedtime.   MULTI-VITAMINS Tabs Take 1 tablet by mouth daily.   NON FORMULARY Allergy injection   nystatin cream Commonly known as:  MYCOSTATIN Apply 1 application topically 2 (two) times daily.   pantoprazole 40 MG tablet Commonly known as:  PROTONIX Take 40 mg by mouth daily.   PREMARIN vaginal cream Generic drug:  conjugated estrogens INSERT 0.5 GRAM INTO THE VAGINA THREE TIMES A WEEK   sertraline 50 MG tablet Commonly known as:  ZOLOFT Take 50 mg by mouth daily.   valACYclovir 1000 MG tablet Commonly known as:  VALTREX Take 1,000 mg by mouth 2 (two) times daily.   zaleplon 10 MG capsule Commonly known as:  SONATA TAKE ONE CAPSULE BY MOUTH AT BEDTIME AS NEEDED       Allergies  Allergen Reactions  . Prednisone     Other reaction(s): Other (See Comments) Manic tendencies  . Amoxicillin   . Sulfa Antibiotics     I appreciate the opportunity to take part in Charity's care. Please do not hesitate to contact me with questions.  Sincerely,   R. Jorene Guest, MD

## 2016-05-29 NOTE — Patient Instructions (Signed)
Allergic rhinitis Stable on current treatment plan.  Continue appropriate allergen avoidance measures, ipratropium 0.06% nasal spray as needed, nasal saline lavage as needed, and guaifenesin if needed.  Cough Improved.  Most likely due to upper airway cough syndrome.  Treatment plan as outlined above.   Return in about 6 months (around 11/29/2016), or if symptoms worsen or fail to improve.

## 2016-05-29 NOTE — Assessment & Plan Note (Signed)
Improved.  Most likely due to upper airway cough syndrome.  Treatment plan as outlined above.

## 2016-05-29 NOTE — Assessment & Plan Note (Signed)
Stable on current treatment plan.  Continue appropriate allergen avoidance measures, ipratropium 0.06% nasal spray as needed, nasal saline lavage as needed, and guaifenesin if needed.

## 2016-07-08 DIAGNOSIS — M5431 Sciatica, right side: Secondary | ICD-10-CM | POA: Insufficient documentation

## 2016-08-14 ENCOUNTER — Telehealth: Payer: Self-pay | Admitting: Allergy

## 2016-08-14 NOTE — Telephone Encounter (Signed)
Called pt - this was a duplicate billing fee per Dion SaucierJanet M. - voided - kt

## 2016-08-14 NOTE — Telephone Encounter (Signed)
Domenica FailKathy Mairen called and said she is still getting a bill for twelve dollars said her payment will be a little late. Don't understand. Please call her She said she was having eye surgery today and would be leaving in about thirty minutes. Thank you.[phone]

## 2016-09-19 ENCOUNTER — Emergency Department (HOSPITAL_COMMUNITY): Payer: Medicare Other

## 2016-09-19 ENCOUNTER — Emergency Department (HOSPITAL_COMMUNITY)
Admission: EM | Admit: 2016-09-19 | Discharge: 2016-09-19 | Disposition: A | Discharge status: Home / Self Care | Payer: Medicare Other | Attending: Emergency Medicine | Admitting: Emergency Medicine

## 2016-09-19 DIAGNOSIS — I129 Hypertensive chronic kidney disease with stage 1 through stage 4 chronic kidney disease, or unspecified chronic kidney disease: Secondary | ICD-10-CM | POA: Insufficient documentation

## 2016-09-19 DIAGNOSIS — R42 Dizziness and giddiness: Secondary | ICD-10-CM

## 2016-09-19 DIAGNOSIS — Z7902 Long term (current) use of antithrombotics/antiplatelets: Secondary | ICD-10-CM | POA: Insufficient documentation

## 2016-09-19 DIAGNOSIS — Z79899 Other long term (current) drug therapy: Secondary | ICD-10-CM | POA: Insufficient documentation

## 2016-09-19 DIAGNOSIS — N189 Chronic kidney disease, unspecified: Secondary | ICD-10-CM | POA: Insufficient documentation

## 2016-09-19 DIAGNOSIS — E871 Hypo-osmolality and hyponatremia: Secondary | ICD-10-CM | POA: Insufficient documentation

## 2016-09-19 DIAGNOSIS — Z87891 Personal history of nicotine dependence: Secondary | ICD-10-CM | POA: Diagnosis not present

## 2016-09-19 LAB — CBC
HCT: 41 % (ref 36.0–46.0)
HEMOGLOBIN: 14 g/dL (ref 12.0–15.0)
MCH: 31.1 pg (ref 26.0–34.0)
MCHC: 34.1 g/dL (ref 30.0–36.0)
MCV: 91.1 fL (ref 78.0–100.0)
PLATELETS: 194 10*3/uL (ref 150–400)
RBC: 4.5 MIL/uL (ref 3.87–5.11)
RDW: 12.7 % (ref 11.5–15.5)
WBC: 10 10*3/uL (ref 4.0–10.5)

## 2016-09-19 LAB — DIFFERENTIAL
BASOS ABS: 0 10*3/uL (ref 0.0–0.1)
Basophils Relative: 0 %
Eosinophils Absolute: 0.1 10*3/uL (ref 0.0–0.7)
Eosinophils Relative: 1 %
LYMPHS ABS: 2.4 10*3/uL (ref 0.7–4.0)
LYMPHS PCT: 24 %
Monocytes Absolute: 1 10*3/uL (ref 0.1–1.0)
Monocytes Relative: 10 %
NEUTROS PCT: 65 %
Neutro Abs: 6.5 10*3/uL (ref 1.7–7.7)

## 2016-09-19 LAB — URINALYSIS, ROUTINE W REFLEX MICROSCOPIC
Bilirubin Urine: NEGATIVE
GLUCOSE, UA: NEGATIVE mg/dL
HGB URINE DIPSTICK: NEGATIVE
KETONES UR: NEGATIVE mg/dL
LEUKOCYTES UA: NEGATIVE
Nitrite: NEGATIVE
PH: 6 (ref 5.0–8.0)
Protein, ur: NEGATIVE mg/dL
Specific Gravity, Urine: 1.004 — ABNORMAL LOW (ref 1.005–1.030)

## 2016-09-19 LAB — COMPREHENSIVE METABOLIC PANEL
ALBUMIN: 4.5 g/dL (ref 3.5–5.0)
ALK PHOS: 78 U/L (ref 38–126)
ALT: 18 U/L (ref 14–54)
AST: 23 U/L (ref 15–41)
Anion gap: 11 (ref 5–15)
BILIRUBIN TOTAL: 0.4 mg/dL (ref 0.3–1.2)
BUN: 19 mg/dL (ref 6–20)
CALCIUM: 9.3 mg/dL (ref 8.9–10.3)
CO2: 27 mmol/L (ref 22–32)
Chloride: 88 mmol/L — ABNORMAL LOW (ref 101–111)
Creatinine, Ser: 0.8 mg/dL (ref 0.44–1.00)
GFR calc Af Amer: >60 mL/min (ref >60)
GFR calc non Af Amer: >60 mL/min (ref >60)
GLUCOSE: 93 mg/dL (ref 65–99)
POTASSIUM: 4.1 mmol/L (ref 3.5–5.1)
SODIUM: 126 mmol/L — AB (ref 135–145)
TOTAL PROTEIN: 7.3 g/dL (ref 6.5–8.1)

## 2016-09-19 LAB — ETHANOL: Alcohol, Ethyl (B): <5 mg/dL (ref <5)

## 2016-09-19 LAB — RAPID URINE DRUG SCREEN, HOSP PERFORMED
AMPHETAMINES: NOT DETECTED
BENZODIAZEPINES: NOT DETECTED
Barbiturates: NOT DETECTED
COCAINE: NOT DETECTED
OPIATES: NOT DETECTED
TETRAHYDROCANNABINOL: NOT DETECTED

## 2016-09-19 LAB — PROTIME-INR
INR: 1.01
Prothrombin Time: 13.2 seconds (ref 11.4–15.2)

## 2016-09-19 LAB — APTT: APTT: 28 s (ref 24–36)

## 2016-09-19 LAB — I-STAT TROPONIN, ED: Troponin i, poc: 0 ng/mL (ref 0.00–0.08)

## 2016-09-19 NOTE — Discharge Instructions (Signed)
Follow up with your primary care doctor to recheck your sodium level.  Return as needed for worsening symptoms

## 2016-09-19 NOTE — ED Provider Notes (Signed)
[facility]-EMERGENCY DEPT Provider Note   CSN: [id number] Arrival date & time: 09/19/16  1932     History   Chief Complaint Chief Complaint  Patient presents with  . Dizziness    HPI Samantha Crane is a 83 y.o. female.  HPI The patient presents to the emergency room for evaluation of dizziness.the patient recently had eye surgery for cataracts. She went to a follow-up appointment today. She did have some eye drops placed. Initially she was feeling fine however later in the day she suddenly started to feel very dizzy and off-balance. Patient felt like she was falling hen she tried to walk or stand. Patient denies any trouble with headache. No trouble with blurred vision. No trouble with shortness of breath.No trouble with any focal numbness or weakness. She denies any room spinning sensation. Past Medical History:  Diagnosis Date  . Anxiety   . Chronic kidney disease    pt denies, states chronic bladder infections  . Depression   . Hyperlipidemia   . Hyperlipidemia   . Hypertension   . IBS (irritable bowel syndrome)   . Orthostatic tremor   . Vitamin D deficiency     Patient Active Problem List   Diagnosis Date Noted  . Allergic rhinitis 11/29/2015  . Cough 11/29/2015  . History of food allergy 11/29/2015    Past Surgical History:  Procedure Laterality Date  . lumbar spinal stenosis      OB History    No data available       Home Medications    Prior to Admission medications   Medication Sig Start Date End Date Taking? Authorizing Provider  albuterol (PROVENTIL HFA;VENTOLIN HFA) 108 (90 Base) MCG/ACT inhaler Inhale 2 puffs into the lungs every 4 (four) hours as needed. 11/29/15  Yes Bobbitt, Heywood Iles, MD  atorvastatin (LIPITOR) 10 MG tablet Take 10 mg by mouth daily.   Yes [provider]  Besifloxacin HCl (BESIVANCE) 0.6 % SUSP Place 1 drop into the right eye 4 (four) times daily.    Yes [provider]  Bromfenac Sodium (PROLENSA) 0.07 % SOLN  Place 1 drop into the right eye daily.   Yes [provider]  buPROPion (WELLBUTRIN XL) 150 MG 24 hr tablet Take 150 mg by mouth daily.  09/22/15 09/21/16 Yes [provider]  calcium-vitamin D (OSCAL WITH D) 250-125 MG-UNIT tablet Take 1 tablet by mouth daily.    Yes [provider]  cholestyramine (QUESTRAN) 4 g packet MIX ONE PACKET AND DRINK TWICE A DAY WITH MEALS 07/17/15  Yes [provider]  clopidogrel (PLAVIX) 75 MG tablet Take 75 mg by mouth daily.   Yes [provider]  conjugated estrogens (PREMARIN) vaginal cream INSERT 0.5 GRAM INTO THE VAGINA THREE TIMES A WEEK 06/24/14  Yes [provider]  cyanocobalamin 500 MCG tablet Take 500 mcg by mouth daily.   Yes [provider]  ferrous sulfate 325 (65 FE) MG tablet Take 325 mg by mouth daily with breakfast.   Yes [provider]  fluticasone (FLONASE) 50 MCG/ACT nasal spray Place into both nostrils daily.   Yes [provider]  gabapentin (NEURONTIN) 300 MG capsule Take 300 mg by mouth 3 (three) times daily.   Yes [provider]  lisinopril (PRINIVIL,ZESTRIL) 20 MG tablet Take 20 mg by mouth daily.   Yes [provider]  Multiple Vitamin (MULTI-VITAMINS) TABS Take 1 tablet by mouth daily.    Yes [provider]  NON FORMULARY Inject 1 each  as directed every 14 (fourteen) days. Allergy injection    Yes [provider]  nystatin cream (MYCOSTATIN) Apply 1 application topically 2 (two) times daily as needed for dry skin.    Yes [provider]  pantoprazole (PROTONIX) 40 MG tablet Take 40 mg by mouth daily.   Yes [provider]  sertraline (ZOLOFT) 50 MG tablet Take 50 mg by mouth daily.   Yes [provider]  valACYclovir (VALTREX) 1000 MG tablet Take 1 g by mouth every 8 (eight) hours as needed (outbreak).  06/21/16  Yes [provider]  ipratropium (ATROVENT) 0.06 % nasal spray Place 2 sprays into  both nostrils 3 (three) times daily. Patient not taking: Reported on 09/19/2016 05/29/16   Bobbitt, Heywood Iles, MD    Family History Family History  Problem Relation Age of Onset  . Allergic rhinitis Neg Hx   . Angioedema Neg Hx   . Asthma Neg Hx   . Immunodeficiency Neg Hx   . Eczema Neg Hx   . Urticaria Neg Hx     Social History Social History  Substance Use Topics  . Smoking status: Former Games developer  . Smokeless tobacco: Never Used     Comment: quit 30 years ago; social smoker  . Alcohol use No     Comment: rarely     Allergies   Prednisone; Amoxicillin; and Sulfa antibiotics   Review of Systems Review of Systems  All other systems reviewed and are negative.    Physical Exam Updated Vital Signs BP (!) 165/82   Resp 14   Physical Exam  Constitutional: She appears well-developed and well-nourished. No distress.  HENT:  Head: Normocephalic and atraumatic.  Right Ear: External ear normal.  Left Ear: External ear normal.  Eyes: Conjunctivae are normal. Right eye exhibits no discharge. Left eye exhibits no discharge. No scleral icterus.  Neck: Neck supple. No tracheal deviation present.  Cardiovascular: Normal rate, regular rhythm and intact distal pulses.   Pulmonary/Chest: Effort normal and breath sounds normal. No stridor. No respiratory distress. She has no wheezes. She has no rales.  Abdominal: Soft. Bowel sounds are normal. She exhibits no distension. There is no tenderness. There is no rebound and no guarding.  Musculoskeletal: She exhibits no edema or tenderness.  Neurological: She is alert. She has normal strength. No cranial nerve deficit (no facial droop, extraocular movements intact, no slurred speech) or sensory deficit. She exhibits normal muscle tone. She displays no seizure activity. Coordination normal.  Skin: Skin is warm and dry. No rash noted.  Psychiatric: She has a normal mood and affect.  Nursing note and vitals reviewed.    ED Treatments /  Results  Labs (all labs ordered are listed, but only abnormal results are displayed) Labs Reviewed  COMPREHENSIVE METABOLIC PANEL - Abnormal; Notable for the following:       Result Value   Sodium 126 (*)    Chloride 88 (*)    All other components within normal limits  URINALYSIS, ROUTINE W REFLEX MICROSCOPIC - Abnormal; Notable for the following:    Color, Urine COLORLESS (*)    Specific Gravity, Urine 1.004 (*)    All other components within normal limits  ETHANOL  PROTIME-INR  APTT  CBC  DIFFERENTIAL  RAPID URINE DRUG SCREEN, HOSP PERFORMED  I-STAT TROPONIN, ED    EKG  EKG Interpretation  Date/Time:  Thursday September 19 2016 20:01:49 EDT Ventricular Rate:  54 PR Interval:    QRS Duration: 77 QT Interval:  453 QTC Calculation: 430 R Axis:   77 Text Interpretation:  Sinus rhythm Probable left atrial enlargement Low voltage, extremity leads Minimal ST elevation, anterior leads No significant change since last tracing Confirmed by Linwood DibblesKnapp, Darrall Strey [id number]) on 09/19/2016 10:08:59 PM       Radiology Ct Head Wo Contrast  Result Date: 09/19/2016 CLINICAL DATA:  Ataxia, stroke suspected. Eye surgery yesterday. Difficulty walking lost balance leading to fall. EXAM: CT HEAD WITHOUT CONTRAST TECHNIQUE: Contiguous axial images were obtained from the base of the skull through the vertex without intravenous contrast. COMPARISON:  None. FINDINGS: Brain: No evidence of acute infarction, hemorrhage, hydrocephalus, extra-axial collection or mass lesion/mass effect. Moderate atrophy, normal for age. Mild to moderate chronic small vessel ischemia. Vascular: Atherosclerosis of skullbase vasculature without hyperdense vessel or abnormal calcification. Skull: No skull fracture.  No focal lesion Sinuses/Orbits: Opacification of a single left ethmoid air cell. Paranasal sinuses and mastoid air cells are otherwise clear. The visualized orbits are unremarkable. Probable right cataract resection. Other: None.  IMPRESSION: 1.  No acute intracranial abnormality. 2. Age related atrophy and chronic small vessel ischemia. Electronically Signed   By: Rubye OaksMelanie  Ehinger M.D.   On: 09/19/2016 20:56    Procedures Procedures (including critical care time)  Medications Ordered in ED Medications - No data to display   Initial Impression / Assessment and Plan / ED Course  I have reviewed the triage vital signs and the nursing notes.  Pertinent labs & imaging results that were available during my care of the patient were reviewed by me and considered in my medical decision making (see chart for details).  Clinical Course as of Sep 19 2233  Thu Sep 19, 2016  2233 Pt is feeling better.  She is able to walk without difficulty.   [JK]    Clinical Course User Index [JK] Linwood DibblesKnapp, Arles Rumbold, MD  patient presented to the emergency room for evaluation of an episode of dizziness. Patient felt weak and thought her balance was off.She has no focal neurologic deficits on exam. Laboratory tests do show hyponatremia which is slightly worse than previous values but I don't think that's the cause of her symptoms. CT scan does not show any acute abnormalities. Patient's symptoms have resolved and she is able to walk around without any difficulty.  she is stable for outpatient follow-up.  Discussed having her sodium level rechecked by her primary care doctor.  Final Clinical Impressions(s) / ED Diagnoses   Final diagnoses:  Hyponatremia  Dizziness    New Prescriptions New Prescriptions   No medications on file     Linwood DibblesKnapp, Jupiter Kabir, MD 09/19/16 2235

## 2016-09-19 NOTE — ED Triage Notes (Signed)
Pt states that she has had dizziness, weakness, and nausea since having cataract post-surgical visit today.

## 2016-09-19 NOTE — ED Notes (Signed)
Bed: [id number] Expected date:  Expected time:  Means of arrival:  Comments: 83 yo f dizzy

## 2016-10-21 DIAGNOSIS — N390 Urinary tract infection, site not specified: Secondary | ICD-10-CM | POA: Insufficient documentation

## 2016-10-21 DIAGNOSIS — N819 Female genital prolapse, unspecified: Secondary | ICD-10-CM | POA: Insufficient documentation

## 2016-10-21 DIAGNOSIS — N952 Postmenopausal atrophic vaginitis: Secondary | ICD-10-CM | POA: Insufficient documentation

## 2016-10-22 ENCOUNTER — Telehealth: Payer: Self-pay

## 2016-10-23 NOTE — Progress Notes (Signed)
VIALS EXP 10-23-17 

## 2016-10-24 DIAGNOSIS — J3089 Other allergic rhinitis: Secondary | ICD-10-CM | POA: Diagnosis not present

## 2016-10-25 DIAGNOSIS — J301 Allergic rhinitis due to pollen: Secondary | ICD-10-CM | POA: Diagnosis not present

## 2016-11-04 ENCOUNTER — Ambulatory Visit: Payer: Self-pay

## 2016-11-04 NOTE — Progress Notes (Deleted)
Immunotherapy   Patient Details  Name: Samantha Crane MRN: [id number] Date of Birth: 07-30-1929  11/04/2016  Samantha Crane  Mailed out red maintiance vials to assisted living facility Following schedule: c  Frequency:once weekly Epi-Pen:yes Consent signed and patient instructions given.   Berna BueCarrie L Whitaker 11/04/2016, 8:35 AM

## 2016-11-05 ENCOUNTER — Ambulatory Visit: Payer: Self-pay

## 2016-11-15 DIAGNOSIS — R339 Retention of urine, unspecified: Secondary | ICD-10-CM | POA: Insufficient documentation

## 2016-12-04 ENCOUNTER — Ambulatory Visit: Payer: Medicare Other | Admitting: Allergy and Immunology

## 2016-12-18 DIAGNOSIS — E871 Hypo-osmolality and hyponatremia: Secondary | ICD-10-CM | POA: Insufficient documentation

## 2017-02-06 ENCOUNTER — Ambulatory Visit (INDEPENDENT_AMBULATORY_CARE_PROVIDER_SITE_OTHER): Payer: Medicare Other | Admitting: Allergy and Immunology

## 2017-02-06 ENCOUNTER — Encounter: Payer: Self-pay | Admitting: Allergy and Immunology

## 2017-02-06 VITALS — BP 126/70 | HR 64 | Temp 97.3°F | Ht 65.3 in | Wt 118.8 lb

## 2017-02-06 DIAGNOSIS — R05 Cough: Secondary | ICD-10-CM

## 2017-02-06 DIAGNOSIS — J3089 Other allergic rhinitis: Secondary | ICD-10-CM | POA: Diagnosis not present

## 2017-02-06 DIAGNOSIS — R059 Cough, unspecified: Secondary | ICD-10-CM

## 2017-02-06 MED ORDER — IPRATROPIUM BROMIDE 0.06 % NA SOLN: 2.0000 | Freq: Three times a day (TID) | NASAL | 5 refills | Status: DC

## 2017-02-06 NOTE — Patient Instructions (Addendum)
Allergic rhinitis Stable on current treatment plan.  Continue appropriate allergen avoidance measures, ipratropium 0.06% nasal spray as needed, nasal saline lavage as needed, and guaifenesin if needed.  A refill prescription has been provided for ipratropium nasal spray.  Cough Improved.  Most likely due to upper airway cough syndrome.  Treatment plan as outlined above.   Return in about 6 months (around 08/06/2017), or if symptoms worsen or fail to improve.

## 2017-02-06 NOTE — Assessment & Plan Note (Signed)
Stable on current treatment plan.  Continue appropriate allergen avoidance measures, ipratropium 0.06% nasal spray as needed, nasal saline lavage as needed, and guaifenesin if needed.  A refill prescription has been provided for ipratropium nasal spray.

## 2017-02-06 NOTE — Progress Notes (Signed)
Follow-up Note  RE: Samantha Crane MRN: [id number] DOB: 1929/12/19 Date of Office Visit: 02/06/2017  Primary care provider: Vernon Prey, NP Referring provider: Bill Salinas I, NP  History of present illness: Samantha Crane is a 84 y.o. female with allergic rhinitis and history of persistent cough presenting today for follow-up.  She was last seen in this clinic in May 2018.  She reports that she has been experiencing some rhinorrhea lately.  She is allergic to dogs and has a dog in the home.  She has placed a air filtration unit in her bedroom.  She is currently taking ipratropium nasal spray, nasal saline irrigation, and guaifenesin as needed.  She needs a refill for ipratropium nasal spray.  She occasionally experiences some coughing, most commonly when laying down at night, however overall it has improved significantly.  She denies heartburn.   Assessment and plan: Allergic rhinitis Stable on current treatment plan.  Continue appropriate allergen avoidance measures, ipratropium 0.06% nasal spray as needed, nasal saline lavage as needed, and guaifenesin if needed.  A refill prescription has been provided for ipratropium nasal spray.  Cough Improved.  Most likely due to upper airway cough syndrome.  Treatment plan as outlined above.   Meds ordered this encounter  Medications  . ipratropium (ATROVENT) 0.06 % nasal spray    Sig: Place 2 sprays into both nostrils 3 (three) times daily.    Dispense:  15 mL    Refill:  5    Diagnostics: Spirometry reveals an FVC of 2.45 L (98% predicted) and an FEV1 of 1.49 L (81% predicted) with an FEV1 ratio of 84%.  Please see scanned spirometry results for details.    Physical examination: Blood pressure 126/70, pulse 64, temperature (!) 97.3 F (36.3 C), temperature source Oral, height 5' 5.3" (1.659 m), weight 118 lb 12.8 oz (53.9 kg), SpO2 97 %.  General: Alert, interactive, in no acute distress. HEENT: TMs pearly gray,  turbinates mildly edematous without discharge, post-pharynx unremarkable. Neck: Supple without lymphadenopathy. Lungs: Clear to auscultation without wheezing, rhonchi or rales. CV: Normal S1, S2 without murmurs. Skin: Warm and dry, without lesions or rashes.  The following portions of the patient's history were reviewed and updated as appropriate: allergies, current medications, past family history, past medical history, past social history, past surgical history and problem list.  Allergies as of 02/06/2017      Reactions   Prednisone    Other reaction(s): Other (See Comments) Manic tendencies   Amlodipine Swelling   Amoxicillin    Amoxicillin-pot Clavulanate Diarrhea   Limonene Diarrhea   Sulfa Antibiotics       Medication List        Accurate as of 02/06/17 11:53 AM. Always use your most recent med list.          albuterol 108 (90 Base) MCG/ACT inhaler Commonly known as:  PROVENTIL HFA;VENTOLIN HFA Inhale 2 puffs into the lungs every 6 (six) hours as needed.   atorvastatin 10 MG tablet Commonly known as:  LIPITOR Take 10 mg by mouth daily.   b complex vitamins tablet Take by mouth.   buPROPion 150 MG 24 hr tablet Commonly known as:  WELLBUTRIN XL Take 150 mg by mouth daily.   buPROPion 150 MG 24 hr tablet Commonly known as:  WELLBUTRIN XL   calcium-vitamin D 500-200 MG-UNIT tablet 2 (two) times a day with meals. Frequency:Daily   Dosage:1   MG-UNIT  Instructions:  Note:TAKE 1 TABLET DAILY AS DIRECTED.  cetirizine 10 MG tablet Commonly known as:  ZYRTEC Take by mouth.   cholestyramine 4 g packet Commonly known as:  QUESTRAN MIX ONE PACKET AND DRINK TWICE A DAY WITH MEALS   clonazePAM 0.5 MG tablet Commonly known as:  KLONOPIN   clopidogrel 75 MG tablet Commonly known as:  PLAVIX Take 75 mg by mouth daily.   cyanocobalamin 500 MCG tablet Take 500 mcg by mouth daily.   ferrous sulfate 325 (65 FE) MG tablet Take 325 mg by mouth daily with breakfast.    fluticasone 50 MCG/ACT nasal spray Commonly known as:  FLONASE Place into both nostrils daily.   gabapentin 300 MG capsule Commonly known as:  NEURONTIN Take 300 mg by mouth 3 (three) times daily.   hydrocortisone 2.5 % cream Apply to affected area bid prn for up to 14 days then stop.   ipratropium 0.06 % nasal spray Commonly known as:  ATROVENT Place 2 sprays into both nostrils 3 (three) times daily.   lisinopril 20 MG tablet Commonly known as:  PRINIVIL,ZESTRIL Take 20 mg by mouth daily.   MUCINEX 600 MG 12 hr tablet Generic drug:  guaiFENesin Take 600 mg by mouth 2 (two) times daily.   MULTI-VITAMINS Tabs Take 1 tablet by mouth daily.   NON FORMULARY Inject 1 each as directed every 14 (fourteen) days. Allergy injection   pantoprazole 40 MG tablet Commonly known as:  PROTONIX Take 40 mg by mouth daily.   PREMARIN vaginal cream Generic drug:  conjugated estrogens INSERT 0.5 GRAM INTO THE VAGINA THREE TIMES A WEEK   SALINE NASAL SPRAY NA Place into the nose.   sertraline 50 MG tablet Commonly known as:  ZOLOFT Take 50 mg by mouth daily.   valACYclovir 1000 MG tablet Commonly known as:  VALTREX Take 1 g by mouth every 8 (eight) hours as needed (outbreak).   zaleplon 10 MG capsule Commonly known as:  SONATA       Allergies  Allergen Reactions  . Prednisone     Other reaction(s): Other (See Comments) Manic tendencies  . Amlodipine Swelling  . Amoxicillin   . Amoxicillin-Pot Clavulanate Diarrhea  . Limonene Diarrhea  . Sulfa Antibiotics     I appreciate the opportunity to take part in Samantha Crane's care. Please do not hesitate to contact me with questions.  Sincerely,   R. Jorene Guestarter Aideen Fenster, MD

## 2017-02-06 NOTE — Assessment & Plan Note (Signed)
Improved.  Most likely due to upper airway cough syndrome.  Treatment plan as outlined above.

## 2017-04-07 DIAGNOSIS — J3089 Other allergic rhinitis: Secondary | ICD-10-CM

## 2017-04-07 NOTE — Progress Notes (Signed)
VIALS EXP 04-10-18 

## 2017-04-08 DIAGNOSIS — J3081 Allergic rhinitis due to animal (cat) (dog) hair and dander: Secondary | ICD-10-CM

## 2017-04-14 ENCOUNTER — Telehealth: Payer: Self-pay | Admitting: Neurology

## 2017-04-14 ENCOUNTER — Ambulatory Visit: Payer: Medicare Other | Admitting: *Deleted

## 2017-04-14 DIAGNOSIS — J3081 Allergic rhinitis due to animal (cat) (dog) hair and dander: Secondary | ICD-10-CM

## 2017-04-14 NOTE — Progress Notes (Addendum)
Immunotherapy   Patient Details  Name: Samantha Crane MRN: [id number] Date of Birth: December 15, 1929  04/14/2017  [facility] [address] [place], [place] [id number] ATTN: Agnes LawrenceHolly Turb[place]il mite-weed and grass-tree Following schedule: C  Frequency:1 time per week  @ 0.5 every 2-3 weeks  Epi-Pen:Epi-Pen Available  Consent signed and patient instructions given. Vials expire 04/10/18  Maurine SimmeringLogan D Collie Kittel 04/14/2017, 8:53 AM

## 2017-04-14 NOTE — Telephone Encounter (Signed)
Patient called and Lmom that while she was at her Rehab and when at home she has had episodes where her body will shake all over but not her hands. She said she had a TIA in 2014. Please Call. Thanks

## 2017-04-14 NOTE — Telephone Encounter (Signed)
Left message on machine for patient to call back.  She will need an appt for reevaluation. She has not been seen in almost 3 years.

## 2017-04-25 ENCOUNTER — Telehealth: Payer: Self-pay | Admitting: Neurology

## 2017-04-25 NOTE — Telephone Encounter (Signed)
FYI

## 2017-04-25 NOTE — Telephone Encounter (Signed)
Kathie RhodesBetty called to let you know that the other day when she called thinking she was having another TIA that it was actually a false alarm. She is Haematologistkay. She said it had to do with medication. She also wanted to let Dr. Arbutus Leasat know that Evans Lancelbert Hon is in hospice Care and that he is declining everyday. Thanks

## 2017-06-17 ENCOUNTER — Emergency Department (HOSPITAL_BASED_OUTPATIENT_CLINIC_OR_DEPARTMENT_OTHER)
Admission: EM | Admit: 2017-06-17 | Discharge: 2017-06-17 | Disposition: A | Discharge status: Home / Self Care | Payer: Medicare Other | Attending: Emergency Medicine | Admitting: Emergency Medicine

## 2017-06-17 ENCOUNTER — Encounter (HOSPITAL_BASED_OUTPATIENT_CLINIC_OR_DEPARTMENT_OTHER): Payer: Self-pay | Admitting: *Deleted

## 2017-06-17 ENCOUNTER — Emergency Department (HOSPITAL_BASED_OUTPATIENT_CLINIC_OR_DEPARTMENT_OTHER): Payer: Medicare Other

## 2017-06-17 ENCOUNTER — Other Ambulatory Visit: Payer: Self-pay

## 2017-06-17 DIAGNOSIS — J40 Bronchitis, not specified as acute or chronic: Secondary | ICD-10-CM | POA: Insufficient documentation

## 2017-06-17 DIAGNOSIS — I129 Hypertensive chronic kidney disease with stage 1 through stage 4 chronic kidney disease, or unspecified chronic kidney disease: Secondary | ICD-10-CM | POA: Diagnosis not present

## 2017-06-17 DIAGNOSIS — Z79899 Other long term (current) drug therapy: Secondary | ICD-10-CM | POA: Diagnosis not present

## 2017-06-17 DIAGNOSIS — N183 Chronic kidney disease, stage 3 (moderate): Secondary | ICD-10-CM | POA: Insufficient documentation

## 2017-06-17 DIAGNOSIS — Z7902 Long term (current) use of antithrombotics/antiplatelets: Secondary | ICD-10-CM | POA: Insufficient documentation

## 2017-06-17 DIAGNOSIS — R05 Cough: Secondary | ICD-10-CM | POA: Diagnosis present

## 2017-06-17 DIAGNOSIS — Z87891 Personal history of nicotine dependence: Secondary | ICD-10-CM | POA: Diagnosis not present

## 2017-06-17 MED ORDER — LEVOFLOXACIN 500 MG PO TABS
500.0000 mg | ORAL_TABLET | Freq: Once | ORAL | Status: AC
  Administered 2017-06-17: 500 mg via ORAL
  Filled 2017-06-17: qty 1

## 2017-06-17 MED ORDER — LEVOFLOXACIN 500 MG PO TABS: 500.0000 mg | ORAL_TABLET | Freq: Every day | ORAL | 0 refills | Status: DC

## 2017-06-17 MED ORDER — ACETAMINOPHEN 325 MG PO TABS
650.0000 mg | ORAL_TABLET | Freq: Once | ORAL | Status: AC
  Administered 2017-06-17: 650 mg via ORAL
  Filled 2017-06-17: qty 2

## 2017-06-17 MED ORDER — ALBUTEROL SULFATE (2.5 MG/3ML) 0.083% IN NEBU
5.0000 mg | INHALATION_SOLUTION | Freq: Once | RESPIRATORY_TRACT | Status: AC
Start: 2017-06-17 — End: 2017-06-17
  Administered 2017-06-17: 5 mg via RESPIRATORY_TRACT
  Filled 2017-06-17: qty 6

## 2017-06-17 MED FILL — levoFLOXacin 500 MG TABS: 500 | 6 days supply | Qty: 6 | Fill #0

## 2017-06-17 NOTE — ED Notes (Signed)
Pt c/o HA.

## 2017-06-17 NOTE — ED Provider Notes (Signed)
[facility] EMERGENCY DEPARTMENT Provider Note   CSN: [id number] Arrival date & time: 06/17/17  1004     History   Chief Complaint Chief Complaint  Patient presents with  . Cough    HPI Samantha Crane is a 84 y.o. female.  HPI Patient with roughly 1 week of cough productive of yellow sputum.  She said subjective fevers and chills in the last few days.  Has had some "rattling in her chest but has not used her inhaler.  Seen by her provider yesterday and given steroid injection and started on antibiotics.  She is unsure which antibiotic. Past Medical History:  Diagnosis Date  . Anxiety   . Chronic kidney disease    pt denies, states chronic bladder infections  . Depression   . Hyperlipidemia   . Hyperlipidemia   . Hypertension   . IBS (irritable bowel syndrome)   . Orthostatic tremor   . Vitamin D deficiency     Patient Active Problem List   Diagnosis Date Noted  . Hyponatremia 12/18/2016  . Incomplete emptying of bladder 11/15/2016  . Prolapse of female genital organs 10/21/2016  . Recurrent UTI 10/21/2016  . Vaginal atrophy 10/21/2016  . Right sciatic nerve pain 07/08/2016  . Chronic nonseasonal allergic rhinitis due to pollen 02/09/2016  . Environmental allergies 02/09/2016  . Allergic rhinitis 11/29/2015  . Cough 11/29/2015  . History of food allergy 11/29/2015  . Primary insomnia 06/05/2015  . Subacute maxillary sinusitis 01/06/2015  . Chronic diarrhea 06/20/2014  . Chronic kidney disease (CKD) stage G3a/A1, moderately decreased glomerular filtration rate (GFR) between 45-59 mL/min/1.73 square meter and albuminuria creatinine ratio less than 30 mg/g (HCC) 11/30/2013  . Alopecia 03/17/2013  . Dermatitis 12/04/2012  . B-complex deficiency 11/19/2012  . Essential tremor 11/19/2012  . Duodenal ulcer with hemorrhage 05/20/2012  . Orthostatic tremor 02/19/2012  . Carotid stenosis, bilateral 09/17/2011  . Stenosis of carotid artery 09/17/2011  .  Hyperlipidemia 04/16/2011  . Personal history of transient ischemic attack (TIA), and cerebral infarction without residual deficits 03/04/2011  . Vitamin D deficiency 09/20/2010  . Anxiety 08/08/2010  . Essential hypertension 08/08/2010  . Irritable bowel syndrome without diarrhea 08/08/2010  . Major depressive disorder, single episode 08/08/2010  . Spinal stenosis 08/08/2007    Past Surgical History:  Procedure Laterality Date  . lumbar spinal stenosis       OB History   None      Home Medications    Prior to Admission medications   Medication Sig Start Date End Date Taking? Authorizing Provider  albuterol (PROVENTIL HFA;VENTOLIN HFA) 108 (90 Base) MCG/ACT inhaler Inhale 2 puffs into the lungs every 6 (six) hours as needed. 11/29/15   [provider]  atorvastatin (LIPITOR) 10 MG tablet Take 10 mg by mouth daily.    [provider]  b complex vitamins tablet Take by mouth.    [provider]  buPROPion (WELLBUTRIN XL) 150 MG 24 hr tablet Take 150 mg by mouth daily.  09/22/15 09/21/16  [provider]  buPROPion (WELLBUTRIN XL) 150 MG 24 hr tablet  11/30/16   [provider]  Calcium Carb-Cholecalciferol (CALCIUM-VITAMIN D) 500-200 MG-UNIT tablet 2 (two) times a day with meals. Frequency:Daily   Dosage:1   MG-UNIT  Instructions:  Note:TAKE 1 TABLET DAILY AS DIRECTED. 04/27/12   [provider]  cetirizine (ZYRTEC) 10 MG tablet Take by mouth.    [provider]  cholestyramine (QUESTRAN) 4 g packet MIX ONE PACKET AND DRINK  TWICE A DAY WITH MEALS 07/17/15   [provider]  clonazePAM (KLONOPIN) 0.5 MG tablet  01/02/17   [provider]  clopidogrel (PLAVIX) 75 MG tablet Take 75 mg by mouth daily.    [provider]  conjugated estrogens (PREMARIN) vaginal cream INSERT 0.5 GRAM INTO THE VAGINA THREE TIMES A WEEK 06/24/14   [provider]  cyanocobalamin 500 MCG tablet Take 500 mcg by mouth  daily.    [provider]  ferrous sulfate 325 (65 FE) MG tablet Take 325 mg by mouth daily with breakfast.    [provider]  fluticasone (FLONASE) 50 MCG/ACT nasal spray Place into both nostrils daily.    [provider]  gabapentin (NEURONTIN) 300 MG capsule Take 300 mg by mouth 3 (three) times daily.    [provider]  guaiFENesin (MUCINEX) 600 MG 12 hr tablet Take 600 mg by mouth 2 (two) times daily.    [provider]  hydrocortisone 2.5 % cream Apply to affected area bid prn for up to 14 days then stop. 10/03/16   [provider]  ipratropium (ATROVENT) 0.06 % nasal spray Place 2 sprays into both nostrils 3 (three) times daily. 02/06/17   Bobbitt, Heywood Iles, MD  levofloxacin (LEVAQUIN) 500 MG tablet Take 1 tablet (500 mg total) by mouth daily. 06/18/17   Loren Racer, MD  lisinopril (PRINIVIL,ZESTRIL) 20 MG tablet Take 20 mg by mouth daily.    [provider]  Multiple Vitamin (MULTI-VITAMINS) TABS Take 1 tablet by mouth daily.     [provider]  NON FORMULARY Inject 1 each as directed every 14 (fourteen) days. Allergy injection     [provider]  pantoprazole (PROTONIX) 40 MG tablet Take 40 mg by mouth daily.    [provider]  SALINE NASAL SPRAY NA Place into the nose.    [provider]  sertraline (ZOLOFT) 50 MG tablet Take 50 mg by mouth daily.    [provider]  valACYclovir (VALTREX) 1000 MG tablet Take 1 g by mouth every 8 (eight) hours as needed (outbreak).  06/21/16   [provider]  zaleplon (SONATA) 10 MG capsule  12/05/16   [provider]    Family History Family History  Problem Relation Age of Onset  . Allergic rhinitis Neg Hx   . Angioedema Neg Hx   . Asthma Neg Hx   . Immunodeficiency Neg Hx   . Eczema Neg Hx   . Urticaria Neg Hx     Social History Social History   Tobacco Use  . Smoking status: Former Smoker    Packs/day:  0.25    Types: Cigarettes  . Smokeless tobacco: Never Used  . Tobacco comment: quit 30 years ago; social smoker  Substance Use Topics  . Alcohol use: No    Alcohol/week: 0.0 oz    Comment: rarely  . Drug use: No     Allergies   Prednisone; Amlodipine; Amoxicillin; Amoxicillin-pot clavulanate; Limonene; and Sulfa antibiotics   Review of Systems Review of Systems  Constitutional: Positive for chills, fatigue and fever.  HENT: Positive for congestion and voice change. Negative for sinus pressure, sore throat and trouble swallowing.   Eyes: Negative for visual disturbance.  Respiratory: Positive for cough, shortness of breath and wheezing.   Cardiovascular: Negative for chest pain, palpitations and leg swelling.  Gastrointestinal: Negative for abdominal pain, diarrhea, nausea and vomiting.  Genitourinary: Negative for dysuria, flank pain and frequency.  Musculoskeletal: Negative for  back pain, myalgias, neck pain and neck stiffness.  Skin: Negative for rash and wound.  Neurological: Negative for dizziness, weakness, light-headedness, numbness and headaches.  All other systems reviewed and are negative.    Physical Exam Updated Vital Signs BP (!) 107/53 (BP Location: Left Arm)   Pulse 76   Resp 20   Ht 5\' 6"  (1.676 m)   Wt 55.3 kg (122 lb)   SpO2 97%   BMI 19.69 kg/m   Physical Exam  Constitutional: She is oriented to person, place, and time. She appears well-developed and well-nourished. No distress.  HENT:  Head: Normocephalic and atraumatic.  Mouth/Throat: Oropharynx is clear and moist.  Oropharynx is mildly erythematous.  Eyes: Pupils are equal, round, and reactive to light. EOM are normal.  Neck: Normal range of motion. Neck supple. No JVD present.  Cardiovascular: Normal rate and regular rhythm. Exam reveals no gallop and no friction rub.  No murmur heard. Pulmonary/Chest: Effort normal. She has wheezes. She has rales.  Patient has scattered wheezes and rales  bilaterally.  Abdominal: Soft. Bowel sounds are normal. There is no tenderness. There is no rebound and no guarding.  Musculoskeletal: Normal range of motion. She exhibits no edema or tenderness.  No lower extremity swelling, asymmetry or tenderness.  Lymphadenopathy:    She has no cervical adenopathy.  Neurological: She is alert and oriented to person, place, and time.  Moves all extremities without focal deficit.  Sensation intact.  Skin: Skin is warm and dry. Capillary refill takes less than 2 seconds. No rash noted. She is not diaphoretic. No erythema.  Psychiatric: She has a normal mood and affect. Her behavior is normal.  Nursing note and vitals reviewed.    ED Treatments / Results  Labs (all labs ordered are listed, but only abnormal results are displayed) Labs Reviewed - No data to display  EKG None  Radiology Dg Chest 2 View  Result Date: 06/17/2017 CLINICAL DATA:  Cough for 1 week. EXAM: CHEST - 2 VIEW COMPARISON:  Chest x-ray dated 03/21/2016. FINDINGS: Lungs are hyperexpanded. No confluent opacity to suggest a developing pneumonia. No pleural effusion or pneumothorax seen. Heart size and mediastinal contours are stable. Atherosclerotic changes noted at the aortic arch. No acute or suspicious osseous finding. IMPRESSION: 1. No active cardiopulmonary disease. No evidence of pneumonia or pulmonary edema. 2. Hyperexpanded lungs suggesting COPD. 3. Aortic atherosclerosis. Electronically Signed   By: Bary Richard M.D.   On: 06/17/2017 10:40    Procedures Procedures (including critical care time)  Medications Ordered in ED Medications  levofloxacin (LEVAQUIN) tablet 500 mg (has no administration in time range)  albuterol (PROVENTIL) (2.5 MG/3ML) 0.083% nebulizer solution 5 mg (5 mg Nebulization Given 06/17/17 1157)  acetaminophen (TYLENOL) tablet 650 mg (650 mg Oral Given 06/17/17 1203)     Initial Impression / Assessment and Plan / ED Course  I have reviewed the triage vital  signs and the nursing notes.  Pertinent labs & imaging results that were available during my care of the patient were reviewed by me and considered in my medical decision making (see chart for details).     Patient states her breathing has improved after breathing treatment and is asking to be discharged home.  No evidence of pneumonia on x-ray.  Does appear to have COPD.  She received a steroid injection yesterday and does not tolerate oral prednisone.  Was started on Z-Pak yesterday.  Will expand coverage with Levaquin.  Instructed to stop taking the Z-Pak and  make an appointment to follow-up closely with her primary physician.  Return precautions have been given.  Final Clinical Impressions(s) / ED Diagnoses   Final diagnoses:  Bronchitis    ED Discharge Orders        Ordered    levofloxacin (LEVAQUIN) 500 MG tablet  Daily     06/17/17 1342       Loren RacerYelverton, Maekayla Giorgio, MD 06/17/17 1346

## 2017-06-17 NOTE — ED Triage Notes (Signed)
Pt reports cough x 1 week, producing yellow sputum. Denies fevers, seen at [facility] and was told to come here for cxr.

## 2017-06-17 NOTE — Discharge Instructions (Signed)
Stop taking Z-Pak and start Levaquin daily.  Use your inhaler as needed for wheezing.  Follow-up closely with your primary physician to ensure improvement of your symptoms.  For worsening shortness of breath, cough or any concerns return to the emergency department.

## 2017-08-13 ENCOUNTER — Encounter: Payer: Self-pay | Admitting: Allergy and Immunology

## 2017-08-13 ENCOUNTER — Ambulatory Visit (INDEPENDENT_AMBULATORY_CARE_PROVIDER_SITE_OTHER): Payer: Medicare Other | Admitting: Allergy and Immunology

## 2017-08-13 VITALS — BP 114/60 | HR 74 | Temp 98.4°F | Resp 16

## 2017-08-13 DIAGNOSIS — J3089 Other allergic rhinitis: Secondary | ICD-10-CM

## 2017-08-13 DIAGNOSIS — R05 Cough: Secondary | ICD-10-CM

## 2017-08-13 DIAGNOSIS — R059 Cough, unspecified: Secondary | ICD-10-CM

## 2017-08-13 MED ORDER — FLUTICASONE PROPIONATE 50 MCG/ACT NA SUSP: 2.0000 | Freq: Every day | NASAL | 3 refills | Status: DC | PRN

## 2017-08-13 NOTE — Patient Instructions (Signed)
Allergic rhinitis Well-controlled.  Discontinue immunotherapy.  Continue fluticasone nasal spray, 2 sprays per nostril daily as needed.  Nasal saline spray (i.e. Simply Saline) is recommended prior to medicated nasal sprays and as needed.  Cough Quiescent.  Most likely due to upper airway cough syndrome.  Treatment plan as outlined above.   Return in about 1 year (around 08/14/2018), or if symptoms worsen or fail to improve.

## 2017-08-13 NOTE — Assessment & Plan Note (Signed)
Quiescent.  Most likely due to upper airway cough syndrome.  Treatment plan as outlined above.

## 2017-08-13 NOTE — Progress Notes (Signed)
Follow-up Note  RE: Samantha Crane Miyasaki MRN: [id number] DOB: 1929-04-13 Date of Office Visit: 08/13/2017  Primary care provider: Vernon Preyurbyfill, Holly I, NP Referring provider: Bill Salinasurbyfill, Holly I, NP  History of present illness: Samantha Crane Hornig is a 84 y.o. female with allergic rhinitis and history of persistent cough presented today for follow-up.  She was last seen in this clinic in January 2019.  She states that her nasal allergy symptoms have been well controlled with fluticasone nasal spray.  She is moving into an assisted living apartment and therefore, gave her dog away and no longer gardens.  She states that she had a prolonged episode of coughing with a viral infection earlier this year, however the cough has resolved.  Assessment and plan: Allergic rhinitis Well-controlled.  Discontinue immunotherapy.  Continue fluticasone nasal spray, 2 sprays per nostril daily as needed.  Nasal saline spray (i.e. Simply Saline) is recommended prior to medicated nasal sprays and as needed.  Cough Quiescent.  Most likely due to upper airway cough syndrome.  Treatment plan as outlined above.   Meds ordered this encounter  Medications  . fluticasone (FLONASE) 50 MCG/ACT nasal spray    Sig: Place 2 sprays into both nostrils daily as needed for allergies or rhinitis.    Dispense:  16 g    Refill:  3    Diagnostics: Spirometry reveals an FVC of 2.07 L and an FEV1 of 1.42 L (1.85 L predicted) with 1 ratio of 95%.  Please see scanned spirometry results for details.    Physical examination: Blood pressure 114/60, pulse 74, temperature 98.4 F (36.9 C), temperature source Oral, resp. rate 16, SpO2 96 %.  General: Alert, interactive, in no acute distress. HEENT: TMs pearly gray, turbinates minimally edematous without discharge, post-pharynx moderately erythematous. Neck: Supple without lymphadenopathy. Lungs: Clear to auscultation without wheezing, rhonchi or rales. CV: Normal S1, S2 without  murmurs. Skin: Warm and dry, without lesions or rashes.  The following portions of the patient's history were reviewed and updated as appropriate: allergies, current medications, past family history, past medical history, past social history, past surgical history and problem list.  Allergies as of 08/13/2017      Reactions   Prednisone    Other reaction(s): Other (See Comments) Manic tendencies   Amlodipine Swelling   Amoxicillin    Amoxicillin-pot Clavulanate Diarrhea   Limonene Diarrhea   Sulfa Antibiotics       Medication List        Accurate as of 08/13/17 12:58 PM. Always use your most recent med list.          albuterol 108 (90 Base) MCG/ACT inhaler Commonly known as:  PROVENTIL HFA;VENTOLIN HFA Inhale 2 puffs into the lungs every 6 (six) hours as needed.   atorvastatin 10 MG tablet Commonly known as:  LIPITOR Take 10 mg by mouth daily.   b complex vitamins tablet Take by mouth.   buPROPion 150 MG 24 hr tablet Commonly known as:  WELLBUTRIN XL Take 150 mg by mouth daily.   buPROPion 150 MG 24 hr tablet Commonly known as:  WELLBUTRIN XL   calcium-vitamin D 500-200 MG-UNIT tablet 2 (two) times a day with meals. Frequency:Daily   Dosage:1   MG-UNIT  Instructions:  Note:TAKE 1 TABLET DAILY AS DIRECTED.   cephALEXin 250 MG capsule Commonly known as:  KEFLEX   cetirizine 10 MG tablet Commonly known as:  ZYRTEC Take by mouth.   cholestyramine 4 g packet Commonly known as:  QUESTRAN MIX ONE PACKET  AND DRINK TWICE A DAY WITH MEALS   clonazePAM 0.5 MG tablet Commonly known as:  KLONOPIN   clopidogrel 75 MG tablet Commonly known as:  PLAVIX Take 75 mg by mouth daily.   ferrous sulfate 325 (65 FE) MG tablet Take 325 mg by mouth daily with breakfast.   fluticasone 50 MCG/ACT nasal spray Commonly known as:  FLONASE Place 2 sprays into both nostrils daily as needed for allergies or rhinitis.   gabapentin 300 MG capsule Commonly known as:  NEURONTIN Take  300 mg by mouth 3 (three) times daily.   lisinopril 20 MG tablet Commonly known as:  PRINIVIL,ZESTRIL Take 20 mg by mouth daily.   MULTI-VITAMINS Tabs Take 1 tablet by mouth daily.   NON FORMULARY Inject 1 each as directed every 14 (fourteen) days. Allergy injection   pantoprazole 40 MG tablet Commonly known as:  PROTONIX Take 40 mg by mouth daily.   PREMARIN vaginal cream Generic drug:  conjugated estrogens INSERT 0.5 GRAM INTO THE VAGINA THREE TIMES A WEEK   SALINE NASAL SPRAY NA Place into the nose.   sertraline 50 MG tablet Commonly known as:  ZOLOFT Take 50 mg by mouth daily.   valACYclovir 1000 MG tablet Commonly known as:  VALTREX Take 1 g by mouth every 8 (eight) hours as needed (outbreak).   vitamin B-12 500 MCG tablet Commonly known as:  CYANOCOBALAMIN Take 500 mcg by mouth daily.   zaleplon 10 MG capsule Commonly known as:  SONATA       Allergies  Allergen Reactions  . Prednisone     Other reaction(s): Other (See Comments) Manic tendencies  . Amlodipine Swelling  . Amoxicillin   . Amoxicillin-Pot Clavulanate Diarrhea  . Limonene Diarrhea  . Sulfa Antibiotics     I appreciate the opportunity to take part in Oree's care. Please do not hesitate to contact me with questions.  Sincerely,   R. Jorene Guest, MD

## 2017-08-13 NOTE — Assessment & Plan Note (Signed)
Well-controlled.  Discontinue immunotherapy.  Continue fluticasone nasal spray, 2 sprays per nostril daily as needed.  Nasal saline spray (i.e. Simply Saline) is recommended prior to medicated nasal sprays and as needed.

## 2017-10-29 ENCOUNTER — Ambulatory Visit (INDEPENDENT_AMBULATORY_CARE_PROVIDER_SITE_OTHER): Payer: Medicare Other | Admitting: Cardiovascular Disease

## 2017-10-29 ENCOUNTER — Encounter: Payer: Self-pay | Admitting: Cardiovascular Disease

## 2017-10-29 VITALS — BP 125/61 | HR 65 | Ht 65.5 in | Wt 129.8 lb

## 2017-10-29 DIAGNOSIS — E7800 Pure hypercholesterolemia, unspecified: Secondary | ICD-10-CM

## 2017-10-29 DIAGNOSIS — I10 Essential (primary) hypertension: Secondary | ICD-10-CM | POA: Diagnosis not present

## 2017-10-29 DIAGNOSIS — I6523 Occlusion and stenosis of bilateral carotid arteries: Secondary | ICD-10-CM | POA: Diagnosis not present

## 2017-10-29 DIAGNOSIS — R609 Edema, unspecified: Secondary | ICD-10-CM | POA: Diagnosis not present

## 2017-10-29 NOTE — Patient Instructions (Signed)
Medication Instructions:  Your physician recommends that you continue on your current medications as directed. Please refer to the Current Medication list given to you today.  If you need a refill on your cardiac medications before your next appointment, please call your pharmacy.   Testing/Procedures: Your physician has requested that you have an echocardiogram. Echocardiography is a painless test that uses sound waves to create images of your heart. It provides your doctor with information about the size and shape of your heart and how well your heart's chambers and valves are working. This procedure takes approximately one hour. There are no restrictions for this procedure.  This will be done at our [facility] location:  [address]  Your physician has requested that you have a carotid duplex. This test is an ultrasound of the carotid arteries in your neck. It looks at blood flow through these arteries that supply the brain with blood. Allow one hour for this exam. There are no restrictions or special instructions.  Follow-Up: At [facility], you and your health needs are our priority.  As part of our continuing mission to provide you with exceptional heart care, we have created designated Provider Care Teams.  These Care Teams include your primary Cardiologist (physician) and Advanced Practice Providers (APPs -  Physician Assistants and Nurse Practitioners) who all work together to provide you with the care you need, when you need it. You will need a follow up appointment in 1 months.  Please call our office 2 months in advance to schedule this appointment.  You may see Dr. Duke Salvia or one of the following Advanced Practice Providers on your designated Care Team:   Corine Shelter, [name] Judy Pimple, [place] . Marjie Skiff, [name]

## 2017-10-29 NOTE — Progress Notes (Signed)
Cardiology Office Note   Date:  10/29/2017   ID:  Samantha Crane, DOB [birth year], MRN [id number]  PCP:  Samantha Prey, NP  Cardiologist:   Samantha Si, MD   No chief complaint on file.     History of Present Illness: Samantha Crane is a 84 y.o. female with hypertension, hyperlipidemia, chronic hyponatremia, TIA, and GI bleed who is being seen today for the evaluation of lower extremity edema at the request of Samantha Crane I, NP.  For the last 3 weeks she has noted lower extremity edema.  It is better in the mornings and worse throughout the day.  She has been wearing compression stockings but this has not helped.  Prior to this episode she has never had edema.  She also notices that she seems more short of breath after her exercise classes.  She does chair exercises at her assisted living facility and feels okay during the class but feels short of breath after.  She also does exercise machine 3 days/week and has no shortness of breath or chest pressure with this activity.  She denies orthopnea or PND.  She was seen by the nurse practitioner at her facility and offered a diuretic but she refused.  She has not experienced any palpitations, lightheadedness, or dizziness.  She does have a history of hyponatremia, though her last serum sodium was reportedly within normal limits.  She has a history of GI bleeding from gastric ulcers.  Samantha Crane has been under a lot of stress lately.  Her husband is currently in hospice.  Past Medical History:  Diagnosis Date  . Anxiety   . Chronic kidney disease    pt denies, states chronic bladder infections  . Depression   . Hyperlipidemia   . Hyperlipidemia   . Hypertension   . IBS (irritable bowel syndrome)   . Orthostatic tremor   . Vitamin D deficiency     Past Surgical History:  Procedure Laterality Date  . lumbar spinal stenosis       Current Outpatient Medications  Medication Sig Dispense Refill  . acetaminophen (TYLENOL)  325 MG tablet Take 650 mg by mouth every 4 (four) hours as needed.    Marland Kitchen albuterol (PROVENTIL HFA;VENTOLIN HFA) 108 (90 Base) MCG/ACT inhaler Inhale 2 puffs into the lungs every 6 (six) hours as needed.    Marland Kitchen atorvastatin (LIPITOR) 10 MG tablet Take 10 mg by mouth daily.    Marland Kitchen b complex vitamins tablet Take by mouth.    Marland Kitchen buPROPion (WELLBUTRIN XL) 150 MG 24 hr tablet Take 150 mg by mouth 2 (two) times daily.     . Calcium Carb-Cholecalciferol (CALCIUM-VITAMIN D) 500-200 MG-UNIT tablet 2 (two) times a day with meals. Frequency:Daily   Dosage:1   MG-UNIT  Instructions:  Note:TAKE 1 TABLET DAILY AS DIRECTED.    . cephALEXin (KEFLEX) 250 MG capsule Take 250 mg by mouth daily.     . cetirizine (ZYRTEC) 10 MG tablet Take 10 mg by mouth daily.     . cholestyramine (QUESTRAN) 4 g packet MIX ONE PACKET AND DRINK TWICE A DAY WITH MEALS    . clonazePAM (KLONOPIN) 0.5 MG tablet Take 0.5 mg by mouth daily.     . Cyanocobalamin (VITAMIN B-12 SL) Place 5,000 mcg under the tongue daily.    . ferrous sulfate 325 (65 FE) MG tablet Take 325 mg by mouth daily with breakfast.    . fluticasone (FLONASE) 50 MCG/ACT nasal spray Place 2 sprays into both nostrils  daily as needed for allergies or rhinitis. 16 g 3  . gabapentin (NEURONTIN) 300 MG capsule Take 300 mg by mouth 2 (two) times daily.     Marland Kitchen lisinopril (PRINIVIL,ZESTRIL) 20 MG tablet Take 20 mg by mouth daily.    . mirtazapine (REMERON) 30 MG tablet Take 30 mg by mouth at bedtime.    . Multiple Vitamin (MULTI-VITAMINS) TABS Take 1 tablet by mouth daily.     . pantoprazole (PROTONIX) 40 MG tablet Take 40 mg by mouth daily.    . sertraline (ZOLOFT) 50 MG tablet Take 100 mg by mouth daily.     . valACYclovir (VALTREX) 1000 MG tablet Take 1 g by mouth every 8 (eight) hours as needed (outbreak).     . zaleplon (SONATA) 10 MG capsule Take 10 mg by mouth at bedtime as needed.      No current facility-administered medications for this visit.     Allergies:   Prednisone;  Amlodipine; Amoxicillin; Amoxicillin-pot clavulanate; Limonene; and Sulfa antibiotics    Social History:  The patient  reports that she has quit smoking. Her smoking use included cigarettes. She smoked 0.25 packs per day. She has never used smokeless tobacco. She reports that she does not drink alcohol or use drugs.   Family History:  The patient's family history is not on file.    ROS:  Please see the history of present illness.   Otherwise, review of systems are positive for none.   All other systems are reviewed and negative.    PHYSICAL EXAM: VS:  BP 125/61 (BP Location: Right Arm, Patient Position: Sitting, Cuff Size: Normal)   Pulse 65   Ht 5' 5.5" (1.664 m)   Wt 129 lb 12.8 oz (58.9 kg)   BMI 21.27 kg/m  , BMI Body mass index is 21.27 kg/m. GENERAL:  Well appearing HEENT:  Pupils equal round and reactive, fundi not visualized, oral mucosa unremarkable NECK:  No jugular venous distention, waveform within normal limits, carotid upstroke brisk and symmetric, no bruits, no thyromegaly LYMPHATICS:  No cervical adenopathy LUNGS:  Clear to auscultation bilaterally HEART:  RRR.  PMI not displaced or sustained,S1 and S2 within normal limits, no S3, no S4, no clicks, no rubs, no murmurs ABD:  Flat, positive bowel sounds normal in frequency in pitch, no bruits, no rebound, no guarding, no midline pulsatile mass, no hepatomegaly, no splenomegaly EXT:  2 plus pulses throughout, trace  edema, no cyanosis no clubbing SKIN:  No rashes no nodules NEURO:  Cranial nerves II through XII grossly intact, motor grossly intact throughout PSYCH:  Cognitively intact, oriented to person place and time   EKG:  EKG is ordered today. The ekg ordered today demonstrates sinus rhythm.  Rate 65.  Poor R wave progression.  Low voltage   Recent Labs: No results found for requested labs within last 8760 hours.    Lipid Panel No results found for: CHOL, TRIG, HDL, CHOLHDL, VLDL, LDLCALC, LDLDIRECT    Wt  Readings from Last 3 Encounters:  10/29/17 129 lb 12.8 oz (58.9 kg)  06/17/17 122 lb (55.3 kg)  02/06/17 118 lb 12.8 oz (53.9 kg)      ASSESSMENT AND PLAN:  # LE edema: Very mild.  Likely due to venous insufficiency.  She has no JVD on exam and, though she is wearing compression stockings it does not seem that she has a significant amount of edema.  We will get an echocardiogram to assess for heart failure.  Given her history  of hyponatremia suspect that diuretics may make this worse.  Therefore we will hold off on any treatment until we have more information.  # Hypertension:  Blood pressure well-controlled.  Continue lisinopril.  # Hyperlipidemia: We will get a copy of her lipids.  Continue atorvastatin.  # Carotid stenosis: 1-49% stenosis in 2016.  Will repeat carotid Doppler.  Continue atorvastatin.  She is not on antiplatelets due to history of GI bleed.   Current medicines are reviewed at length with the patient today.  The patient does not have concerns regarding medicines.  The following changes have been made:  no change  Labs/ tests ordered today include:  No orders of the defined types were placed in this encounter.    Disposition:   FU with Furman Trentman C. Duke Salvia, MD, [facility] in 1 month   Signed, Nicollette Wilhelmi C. Duke Salvia, MD, [facility]  10/29/2017 9:39 AM    [facility]

## 2017-10-30 ENCOUNTER — Encounter: Payer: Self-pay | Admitting: Cardiovascular Disease

## 2017-11-10 ENCOUNTER — Other Ambulatory Visit: Payer: Self-pay

## 2017-11-10 ENCOUNTER — Ambulatory Visit (HOSPITAL_COMMUNITY): Payer: Medicare Other | Attending: Cardiology

## 2017-11-10 DIAGNOSIS — N189 Chronic kidney disease, unspecified: Secondary | ICD-10-CM | POA: Diagnosis not present

## 2017-11-10 DIAGNOSIS — I129 Hypertensive chronic kidney disease with stage 1 through stage 4 chronic kidney disease, or unspecified chronic kidney disease: Secondary | ICD-10-CM | POA: Diagnosis not present

## 2017-11-10 DIAGNOSIS — R609 Edema, unspecified: Secondary | ICD-10-CM | POA: Diagnosis present

## 2017-11-10 DIAGNOSIS — E785 Hyperlipidemia, unspecified: Secondary | ICD-10-CM | POA: Diagnosis not present

## 2017-11-11 ENCOUNTER — Ambulatory Visit (HOSPITAL_COMMUNITY)
Admission: RE | Admit: 2017-11-11 | Discharge: 2017-11-11 | Disposition: A | Discharge status: Home / Self Care | Payer: Medicare Other | Source: Ambulatory Visit | Attending: Cardiology | Admitting: Cardiology

## 2017-11-11 DIAGNOSIS — I6523 Occlusion and stenosis of bilateral carotid arteries: Secondary | ICD-10-CM | POA: Diagnosis present

## 2017-12-16 ENCOUNTER — Ambulatory Visit: Payer: Medicare Other | Admitting: Cardiovascular Disease

## 2018-02-09 ENCOUNTER — Encounter (INDEPENDENT_AMBULATORY_CARE_PROVIDER_SITE_OTHER): Payer: Self-pay

## 2018-02-09 ENCOUNTER — Ambulatory Visit (INDEPENDENT_AMBULATORY_CARE_PROVIDER_SITE_OTHER): Payer: Medicare Other | Admitting: Cardiovascular Disease

## 2018-02-09 ENCOUNTER — Encounter: Payer: Self-pay | Admitting: Cardiovascular Disease

## 2018-02-09 VITALS — BP 100/57 | HR 66 | Ht 65.5 in | Wt 135.0 lb

## 2018-02-09 DIAGNOSIS — I6523 Occlusion and stenosis of bilateral carotid arteries: Secondary | ICD-10-CM

## 2018-02-09 DIAGNOSIS — I10 Essential (primary) hypertension: Secondary | ICD-10-CM | POA: Diagnosis not present

## 2018-02-09 DIAGNOSIS — E7800 Pure hypercholesterolemia, unspecified: Secondary | ICD-10-CM

## 2018-02-09 NOTE — Progress Notes (Signed)
Cardiology Office Note   Date:  02/09/2018   ID:  Samantha Crane, DOB 1929/12/19, MRN [id number]  PCP:  Nadara Eaton, MD  Cardiologist:   Chilton Si, MD   No chief complaint on file.     History of Present Illness: Samantha Crane is a 85 y.o. female with hypertension, hyperlipidemia, chronic hyponatremia, TIA, and GI bleed here for follow up.  She was initially seen 10/2017 for the evaluation of lower extremity edema.  It was better in the mornings and worse throughout the day.  She was referred for an echocardiogram 10/2017 that revealed LVEF 60 to 65% with grade 1 diastolic dysfunction.  Pulmonary pressure was normal and her IVC was not dilated.  She also has a history of carotid stenosis.  Repeat carotid Dopplers 10/2017 revealed mild stenosis bilaterally.  She has a history of hyponatremia and therefore was not started on any diuretics.  She has been wearing compression socks and this has been helpful.  Lately she denies any lower extremity edema.  Since her last appointment her husband passed away January 02, 2018.  She also had 2 great grandsons born in the last month.  She has been feeling well and exercises most days of the week.  She has no exertional chest pain or shortness of breath.  She notes that her blood pressure has been somewhat erratic.  Sometimes it is as low as the 100s and sometimes as high as the 150s.  She does not feel poorly with either extreme of blood pressure.  In general it has been well-controlled.   Past Medical History:  Diagnosis Date  . Anxiety   . Chronic kidney disease    pt denies, states chronic bladder infections  . Depression   . Hyperlipidemia   . Hyperlipidemia   . Hypertension   . IBS (irritable bowel syndrome)   . Orthostatic tremor   . Vitamin D deficiency     Past Surgical History:  Procedure Laterality Date  . lumbar spinal stenosis       Current Outpatient Medications  Medication Sig Dispense Refill  . acetaminophen (TYLENOL)  325 MG tablet Take 650 mg by mouth every 4 (four) hours as needed.    Marland Kitchen albuterol (PROVENTIL HFA;VENTOLIN HFA) 108 (90 Base) MCG/ACT inhaler Inhale 2 puffs into the lungs every 6 (six) hours as needed.    Marland Kitchen atorvastatin (LIPITOR) 10 MG tablet Take 10 mg by mouth daily.    Marland Kitchen b complex vitamins tablet Take by mouth.    . benzonatate (TESSALON) 100 MG capsule Take 100 mg by mouth as directed.    Marland Kitchen buPROPion (WELLBUTRIN XL) 150 MG 24 hr tablet Take 150 mg by mouth 2 (two) times daily.     . Calcium Carb-Cholecalciferol (CALCIUM-VITAMIN D) 500-200 MG-UNIT tablet 2 (two) times a day with meals. Frequency:Daily   Dosage:1   MG-UNIT  Instructions:  Note:TAKE 1 TABLET DAILY AS DIRECTED.    . cephALEXin (KEFLEX) 250 MG capsule Take 250 mg by mouth daily.     . cetirizine (ZYRTEC) 10 MG tablet Take 10 mg by mouth daily.     . cholestyramine (QUESTRAN) 4 g packet MIX ONE PACKET AND DRINK TWICE A DAY WITH MEALS    . clonazePAM (KLONOPIN) 0.5 MG tablet Take 0.5 mg by mouth daily.     . Cyanocobalamin (VITAMIN B-12 SL) Place 5,000 mcg under the tongue daily.    . ferrous sulfate 325 (65 FE) MG tablet Take 325 mg by mouth daily with  breakfast.    . fluticasone (FLONASE) 50 MCG/ACT nasal spray Place 2 sprays into both nostrils daily as needed for allergies or rhinitis. 16 g 3  . gabapentin (NEURONTIN) 300 MG capsule Take 300 mg by mouth 2 (two) times daily.     Marland Kitchen. lisinopril (PRINIVIL,ZESTRIL) 20 MG tablet Take 20 mg by mouth daily.    . mirtazapine (REMERON) 30 MG tablet Take 30 mg by mouth at bedtime.    . Multiple Vitamin (MULTI-VITAMINS) TABS Take 1 tablet by mouth daily.     . pantoprazole (PROTONIX) 40 MG tablet Take 40 mg by mouth daily.    . sertraline (ZOLOFT) 50 MG tablet Take 100 mg by mouth daily.     . valACYclovir (VALTREX) 1000 MG tablet Take 1 g by mouth every 8 (eight) hours as needed (outbreak).     . zaleplon (SONATA) 10 MG capsule Take 10 mg by mouth at bedtime as needed.      No current  facility-administered medications for this visit.     Allergies:   Prednisone; Amlodipine; Amoxicillin; Amoxicillin-pot clavulanate; Limonene; and Sulfa antibiotics    Social History:  The patient  reports that she has quit smoking. Her smoking use included cigarettes. She smoked 0.25 packs per day. She has never used smokeless tobacco. She reports that she does not drink alcohol or use drugs.   Family History:  The patient's family history includes Heart attack in her father; Stroke in her brother, mother, and paternal aunt.    ROS:  Please see the history of present illness.   Otherwise, review of systems are positive for none.   All other systems are reviewed and negative.    PHYSICAL EXAM: VS:  BP (!) 100/57   Pulse 66   Ht 5' 5.5" (1.664 m)   Wt 135 lb (61.2 kg)   SpO2 98%   BMI 22.12 kg/m  , BMI Body mass index is 22.12 kg/m. GENERAL:  Well appearing HEENT:  Pupils equal round and reactive, fundi not visualized, oral mucosa unremarkable NECK:  No jugular venous distention, waveform within normal limits, carotid upstroke brisk and symmetric, no bruits, no thyromegaly LYMPHATICS:  No cervical adenopathy LUNGS:  Clear to auscultation bilaterally HEART:  RRR.  PMI not displaced or sustained,S1 and S2 within normal limits, no S3, no S4, no clicks, no rubs, no murmurs ABD:  Flat, positive bowel sounds normal in frequency in pitch, no bruits, no rebound, no guarding, no midline pulsatile mass, no hepatomegaly, no splenomegaly EXT:  2 plus pulses throughout, trace  edema, no cyanosis no clubbing SKIN:  No rashes no nodules NEURO:  Cranial nerves II through XII grossly intact, motor grossly intact throughout PSYCH:  Cognitively intact, oriented to person place and time   EKG:  EKG is not ordered today. The ekg ordered 10/2017 demonstrates sinus rhythm.  Rate 65.  Poor R wave progression.  Low voltage  Echo 11/10/17: Study Conclusions  - Left ventricle: The cavity size was  normal. Wall thickness was   normal. Systolic function was normal. The estimated ejection   fraction was in the range of 60% to 65%. Wall motion was normal;   there were no regional wall motion abnormalities. Doppler   parameters are consistent with abnormal left ventricular   relaxation (grade 1 diastolic dysfunction). - Pulmonary arteries: PA peak pressure: 35 mm Hg (S). - Pericardium, extracardiac: A trivial pericardial effusion was   identified.  Impressions:  - Normal LV systolic function; mild diastolic dysfunction.  Carotid Doppler 10/2017: 1-39% ICA stenosis bilaterally.  Recent Labs: No results found for requested labs within last 8760 hours.    Lipid Panel No results found for: CHOL, TRIG, HDL, CHOLHDL, VLDL, LDLCALC, LDLDIRECT    Wt Readings from Last 3 Encounters:  02/09/18 135 lb (61.2 kg)  10/29/17 129 lb 12.8 oz (58.9 kg)  06/17/17 122 lb (55.3 kg)      ASSESSMENT AND PLAN:  # LE edema: # Venous insufficiency: Improved with compression stockings.  Echo showed normal systolic function and grade 1 diastolic dysfunction.  She is euvolemic on exam.  Given her history of hyponatremia we will avoid diuretics.  # Hypertension:  Blood pressure well-controlled today but has been labile at home.  We will continue to monitor.  Continue lisinopril.  # Hyperlipidemia: Check fasting lipids and CMP.  Continue atorvastatin.   # Carotid stenosis: 1-39% 10/2017.  Repeat in 1 year.  Continue atorvastatin and check lipids as above    Current medicines are reviewed at length with the patient today.  The patient does not have concerns regarding medicines.  The following changes have been made:  no change  Labs/ tests ordered today include:   Orders Placed This Encounter  Procedures  . Lipid panel  . Comprehensive metabolic panel     Disposition:   FU with Darin Arndt C. Duke Salvia, MD, [facility] in 1 year.   Signed, Shannie Kontos C. Duke Salvia, MD, [facility]  02/09/2018 3:25 PM      [facility]

## 2018-02-09 NOTE — Patient Instructions (Addendum)
Medication Instructions:  Your physician recommends that you continue on your current medications as directed. Please refer to the Current Medication list given to you today.  If you need a refill on your cardiac medications before your next appointment, please call your pharmacy.   Lab work: FASTING LP/CMET SOON  Testing/Procedures: Your physician has requested that you have a carotid duplex. This test is an ultrasound of the carotid arteries in your neck. It looks at blood flow through these arteries that supply the brain with blood. Allow one hour for this exam. There are no restrictions or special instructions. IN October   Follow-Up: At [facility], you and your health needs are our priority.  As part of our continuing mission to provide you with exceptional heart care, we have created designated Provider Care Teams.  These Care Teams include your primary Cardiologist (physician) and Advanced Practice Providers (APPs -  Physician Assistants and Nurse Practitioners) who all work together to provide you with the care you need, when you need it. You will need a follow up appointment in 9 months AFTER CAROTID  You may see DR [facility] or one of the following Advanced Practice Providers on your designated Care Team:   Corine Shelter, [name] Judy Pimple, [place] . Marjie Skiff, [name]

## 2018-02-12 ENCOUNTER — Encounter: Payer: Self-pay | Admitting: Cardiovascular Disease

## 2018-03-13 ENCOUNTER — Emergency Department (HOSPITAL_BASED_OUTPATIENT_CLINIC_OR_DEPARTMENT_OTHER)
Admission: EM | Admit: 2018-03-13 | Discharge: 2018-03-14 | Disposition: A | Discharge status: Home / Self Care | Payer: Medicare Other | Attending: Emergency Medicine | Admitting: Emergency Medicine

## 2018-03-13 ENCOUNTER — Other Ambulatory Visit: Payer: Self-pay

## 2018-03-13 DIAGNOSIS — Z79899 Other long term (current) drug therapy: Secondary | ICD-10-CM | POA: Diagnosis not present

## 2018-03-13 DIAGNOSIS — Y92013 Bedroom of single-family (private) house as the place of occurrence of the external cause: Secondary | ICD-10-CM | POA: Diagnosis not present

## 2018-03-13 DIAGNOSIS — W01190A Fall on same level from slipping, tripping and stumbling with subsequent striking against furniture, initial encounter: Secondary | ICD-10-CM | POA: Insufficient documentation

## 2018-03-13 DIAGNOSIS — Z23 Encounter for immunization: Secondary | ICD-10-CM | POA: Diagnosis not present

## 2018-03-13 DIAGNOSIS — Y92009 Unspecified place in unspecified non-institutional (private) residence as the place of occurrence of the external cause: Secondary | ICD-10-CM

## 2018-03-13 DIAGNOSIS — S0101XA Laceration without foreign body of scalp, initial encounter: Secondary | ICD-10-CM

## 2018-03-13 DIAGNOSIS — Z87891 Personal history of nicotine dependence: Secondary | ICD-10-CM | POA: Diagnosis not present

## 2018-03-13 DIAGNOSIS — S298XXA Other specified injuries of thorax, initial encounter: Secondary | ICD-10-CM

## 2018-03-13 DIAGNOSIS — S01312A Laceration without foreign body of left ear, initial encounter: Secondary | ICD-10-CM | POA: Diagnosis not present

## 2018-03-13 DIAGNOSIS — N189 Chronic kidney disease, unspecified: Secondary | ICD-10-CM | POA: Diagnosis not present

## 2018-03-13 DIAGNOSIS — Y998 Other external cause status: Secondary | ICD-10-CM | POA: Diagnosis not present

## 2018-03-13 DIAGNOSIS — Y9301 Activity, walking, marching and hiking: Secondary | ICD-10-CM | POA: Diagnosis not present

## 2018-03-13 DIAGNOSIS — S0990XA Unspecified injury of head, initial encounter: Secondary | ICD-10-CM | POA: Diagnosis present

## 2018-03-13 DIAGNOSIS — I129 Hypertensive chronic kidney disease with stage 1 through stage 4 chronic kidney disease, or unspecified chronic kidney disease: Secondary | ICD-10-CM | POA: Insufficient documentation

## 2018-03-13 DIAGNOSIS — W19XXXA Unspecified fall, initial encounter: Secondary | ICD-10-CM

## 2018-03-13 NOTE — ED Triage Notes (Signed)
Pt arrives via EMS from [facility]. Pt fell getting into bed. Pt hit her ear/head on the corner of the bedside table. She fell onto the left side, having some left arm pain. C collar in place on arrival, removed by EDP after exam.

## 2018-03-14 ENCOUNTER — Encounter (HOSPITAL_BASED_OUTPATIENT_CLINIC_OR_DEPARTMENT_OTHER): Payer: Self-pay | Admitting: *Deleted

## 2018-03-14 ENCOUNTER — Emergency Department (HOSPITAL_BASED_OUTPATIENT_CLINIC_OR_DEPARTMENT_OTHER): Payer: Medicare Other

## 2018-03-14 DIAGNOSIS — S01312A Laceration without foreign body of left ear, initial encounter: Secondary | ICD-10-CM | POA: Diagnosis not present

## 2018-03-14 MED ORDER — TETANUS-DIPHTH-ACELL PERTUSSIS 5-2.5-18.5 LF-MCG/0.5 IM SUSP
0.5000 mL | Freq: Once | INTRAMUSCULAR | Status: AC
  Administered 2018-03-14: 0.5 mL via INTRAMUSCULAR
  Filled 2018-03-14: qty 0.5

## 2018-03-14 MED ORDER — ACETAMINOPHEN 325 MG PO TABS
650.0000 mg | ORAL_TABLET | Freq: Once | ORAL | Status: AC
  Administered 2018-03-14: 650 mg via ORAL
  Filled 2018-03-14: qty 2

## 2018-03-14 MED ORDER — LIDOCAINE HCL 2 % IJ SOLN
10.0000 mL | Freq: Once | INTRAMUSCULAR | Status: AC
  Administered 2018-03-14: 200 mg via INTRADERMAL
  Filled 2018-03-14: qty 20

## 2018-03-14 NOTE — ED Notes (Signed)
ED Provider at bedside. 

## 2018-03-14 NOTE — ED Notes (Signed)
Patient transported to X-ray 

## 2018-03-14 NOTE — ED Notes (Signed)
[email or web address] , not ready for xray

## 2018-03-14 NOTE — ED Provider Notes (Signed)
[facility] Provider Note: Lowella Dell, MD, FACEP  CSN: [id number] MRN: [id number] ARRIVAL: 03/13/18 at 2356 ROOM: MH09/MH09   CHIEF COMPLAINT  Fall   HISTORY OF PRESENT ILLNESS  03/14/18 12:07 AM Samantha Crane is a 85 y.o. female who was getting into bed just prior to arrival and tripped and fell in the dark.  She struck her left earlobe against a dresser and has a laceration there.  She also has pain to her left ribs.  She rates the pain is negligible at rest but a 6 out of 10 when she tries to sit up.  She is not short of breath.  She denies neck pain.  She there was no loss of consciousness.  She was placed in a cervical collar prior to transport per protocol.   Past Medical History:  Diagnosis Date  . Anxiety   . Chronic kidney disease    pt denies, states chronic bladder infections  . Depression   . Hyperlipidemia   . Hypertension   . IBS (irritable bowel syndrome)   . Orthostatic tremor   . Vitamin D deficiency     Past Surgical History:  Procedure Laterality Date  . lumbar spinal stenosis      Family History  Problem Relation Age of Onset  . Heart attack Father   . Stroke Mother   . Stroke Brother   . Stroke Paternal Aunt   . Allergic rhinitis Neg Hx   . Angioedema Neg Hx   . Asthma Neg Hx   . Immunodeficiency Neg Hx   . Eczema Neg Hx   . Urticaria Neg Hx     Social History   Tobacco Use  . Smoking status: Former Smoker    Packs/day: 0.25    Types: Cigarettes  . Smokeless tobacco: Never Used  . Tobacco comment: quit 30 years ago; social smoker  Substance Use Topics  . Alcohol use: No    Alcohol/week: 0.0 standard drinks    Comment: rarely  . Drug use: No    Prior to Admission medications   Medication Sig Start Date End Date Taking? Authorizing Provider  acetaminophen (TYLENOL) 325 MG tablet Take 650 mg by mouth every 4 (four) hours as needed.    [provider]  albuterol (PROVENTIL HFA;VENTOLIN HFA) 108 (90 Base)  MCG/ACT inhaler Inhale 2 puffs into the lungs every 6 (six) hours as needed. 11/29/15   [provider]  atorvastatin (LIPITOR) 10 MG tablet Take 10 mg by mouth daily.    [provider]  b complex vitamins tablet Take by mouth.    [provider]  benzonatate (TESSALON) 100 MG capsule Take 100 mg by mouth as directed.    [provider]  buPROPion (WELLBUTRIN XL) 150 MG 24 hr tablet Take 150 mg by mouth 2 (two) times daily.  11/30/16   [provider]  Calcium Carb-Cholecalciferol (CALCIUM-VITAMIN D) 500-200 MG-UNIT tablet 2 (two) times a day with meals. Frequency:Daily   Dosage:1   MG-UNIT  Instructions:  Note:TAKE 1 TABLET DAILY AS DIRECTED. 04/27/12   [provider]  cephALEXin (KEFLEX) 250 MG capsule Take 250 mg by mouth daily.  06/27/17   [provider]  cetirizine (ZYRTEC) 10 MG tablet Take 10 mg by mouth daily.     [provider]  cholestyramine (QUESTRAN) 4 g packet MIX ONE PACKET AND DRINK TWICE A DAY WITH MEALS 07/17/15   [provider]  clonazePAM (KLONOPIN) 0.5 MG tablet Take 0.5 mg  by mouth daily.  01/02/17   [provider]  Cyanocobalamin (VITAMIN B-12 SL) Place 5,000 mcg under the tongue daily.    [provider]  ferrous sulfate 325 (65 FE) MG tablet Take 325 mg by mouth daily with breakfast.    [provider]  fluticasone (FLONASE) 50 MCG/ACT nasal spray Place 2 sprays into both nostrils daily as needed for allergies or rhinitis. 08/13/17   Bobbitt, Heywood Iles, MD  gabapentin (NEURONTIN) 300 MG capsule Take 300 mg by mouth 2 (two) times daily.     [provider]  lisinopril (PRINIVIL,ZESTRIL) 20 MG tablet Take 20 mg by mouth daily.    [provider]  mirtazapine (REMERON) 30 MG tablet Take 30 mg by mouth at bedtime.    [provider]  Multiple Vitamin (MULTI-VITAMINS) TABS Take 1 tablet by mouth daily.     [provider]    pantoprazole (PROTONIX) 40 MG tablet Take 40 mg by mouth daily.    [provider]  sertraline (ZOLOFT) 50 MG tablet Take 100 mg by mouth daily.     [provider]  valACYclovir (VALTREX) 1000 MG tablet Take 1 g by mouth every 8 (eight) hours as needed (outbreak).  06/21/16   [provider]  zaleplon (SONATA) 10 MG capsule Take 10 mg by mouth at bedtime as needed.  12/05/16   [provider]    Allergies Prednisone; Amlodipine; Amoxicillin; Amoxicillin-pot clavulanate; Limonene; and Sulfa antibiotics   REVIEW OF SYSTEMS  Negative except as noted here or in the History of Present Illness.   PHYSICAL EXAMINATION  Initial Vital Signs Blood pressure (!) 168/68, pulse (!) 55, temperature 97.7 F (36.5 C), temperature source Oral, resp. rate 16, height 5\' 6"  (1.676 m), weight 59 kg, SpO2 97 %.  Examination General: Well-developed, well-nourished female in no acute distress; appearance consistent with age of record HENT: normocephalic; lacerations above left earlobe and behind left external ear; no hemotympanum Eyes: pupils equal, round and reactive to light; extraocular muscles intact Neck: supple; nontender Heart: regular rate and rhythm Lungs: clear to auscultation bilaterally Chest: Mild left chest wall tenderness without deformity or crepitus Abdomen: soft; nondistended; nontender; bowel sounds present Extremities: Arthritic changes; no acute deformity; pulses normal; no pain on passive range of motion Neurologic: Awake, alert and oriented; motor function intact in all extremities and symmetric; no facial droop Skin: Warm and dry Psychiatric: Normal mood and affect   RESULTS  Summary of this visit's results, reviewed by myself:   EKG Interpretation  Date/Time:    Ventricular Rate:    PR Interval:    QRS Duration:   QT Interval:    QTC Calculation:   R Axis:     Text Interpretation:        Laboratory Studies: No results found for  this or any previous visit (from the past 24 hour(s)). Imaging Studies: Dg Ribs Unilateral W/chest Left  Result Date: 03/14/2018 CLINICAL DATA:  Left rib pain after a fall. EXAM: LEFT RIBS AND CHEST - 3+ VIEW COMPARISON:  Chest 06/17/2017 FINDINGS: Normal heart size and pulmonary vascularity. No focal airspace disease or consolidation in the lungs. No blunting of costophrenic angles. No pneumothorax. Mediastinal contours appear intact. Calcified granuloma in the right mid lung. Left ribs appear intact. No acute displaced fractures identified. Calcified granulomas in the spleen. Postoperative changes with plate and screw fixation of the left humerus. IMPRESSION: 1. No evidence of active pulmonary disease. Negative left ribs. 2. No acute rib  fracture or bone destruction. Electronically Signed   By: Burman Nieves M.D.   On: 03/14/2018 01:17    ED COURSE and MDM  Nursing notes and initial vitals signs, including pulse oximetry, reviewed.  Vitals:   03/14/18 0005 03/14/18 0006  BP:  (!) 168/68  Pulse:  (!) 55  Resp:  16  Temp:  97.7 F (36.5 C)  TempSrc:  Oral  SpO2:  97%  Weight: 59 kg   Height: 5\' 6"  (1.676 m)    Although the radiologist sees no rib fractures we will provide an incentive spirometer to help her keep her lungs open.  PROCEDURES   LACERATION REPAIR Performed by: Carlisle Beers Jerel Sardina Authorized by: Carlisle Beers Madie Cahn Consent: Verbal consent obtained. Risks and benefits: risks, benefits and alternatives were discussed Consent given by: patient Patient identity confirmed: provided demographic data Prepped and Draped in normal sterile fashion Wound explored  Laceration Location: Left ear, above left earlobe  Laceration Length: 2 cm (flap)  No Foreign Bodies seen or palpated  Anesthesia: local infiltration  Local anesthetic: lidocaine 2% without epinephrine  Anesthetic total: 2 ml  Irrigation method: syringe Amount of cleaning: standard  Skin closure: 5-0  Ethilon  Number of sutures: 5  Technique: Simple interrupted  Patient tolerance: Patient tolerated the procedure well with no immediate complications.  LACERATION REPAIR Performed by: Carlisle Beers Kirtan Sada Authorized by: Carlisle Beers Peyson Postema Consent: Verbal consent obtained. Risks and benefits: risks, benefits and alternatives were discussed Consent given by: patient Patient identity confirmed: provided demographic data Prepped and Draped in normal sterile fashion Wound explored  Laceration Location: Behind left ear  Laceration Length: 1 cm  No Foreign Bodies seen or palpated  Anesthesia: local infiltration  Local anesthetic: lidocaine 2 % with epinephrine  Anesthetic total: 1 ml  Irrigation method: syringe Amount of cleaning: standard  Skin closure: 5-0 Ethilon  Number of sutures: 2  Technique: Simple interrupted  Patient tolerance: Patient tolerated the procedure well with no immediate complications.      ED DIAGNOSES     ICD-10-CM   1. Fall in home, initial encounter W19.XXXA    Y92.009   2. Laceration of helix of left ear, initial encounter S01.312A   3. Laceration of postauricular region, initial encounter S01.01XA   4. Blunt chest trauma, initial encounter S29.8XXA        Micharl Helmes, Jonny Ruiz, MD 03/14/18 [phone]

## 2018-03-24 ENCOUNTER — Encounter (INDEPENDENT_AMBULATORY_CARE_PROVIDER_SITE_OTHER): Payer: Self-pay | Admitting: Specialist

## 2018-03-24 ENCOUNTER — Ambulatory Visit (INDEPENDENT_AMBULATORY_CARE_PROVIDER_SITE_OTHER): Payer: TRICARE For Life (TFL) | Admitting: Specialist

## 2018-03-24 VITALS — BP 158/72 | HR 63 | Ht 65.0 in | Wt 130.0 lb

## 2018-03-24 DIAGNOSIS — M810 Age-related osteoporosis without current pathological fracture: Secondary | ICD-10-CM

## 2018-03-24 DIAGNOSIS — S22070D Wedge compression fracture of T9-T10 vertebra, subsequent encounter for fracture with routine healing: Secondary | ICD-10-CM | POA: Diagnosis not present

## 2018-03-24 DIAGNOSIS — G8911 Acute pain due to trauma: Secondary | ICD-10-CM

## 2018-03-24 MED ORDER — ACETAMINOPHEN-CODEINE #4 300-60 MG PO TABS: 1.0000 | ORAL_TABLET | ORAL | 0 refills | Status: DC | PRN

## 2018-03-24 NOTE — Progress Notes (Signed)
Office Visit Note   Patient: Samantha Crane           Date of Birth: March 07, 1929           MRN: [id number] Visit Date: 03/24/2018              Requested by: Nadara Eaton, MD [address] [place], [place] [address] PCP: Nadara Eaton, MD   Assessment & Plan: Visit Diagnoses:  1. Compression fracture of T10 vertebra with routine healing, subsequent encounter   2. Age-related osteoporosis without current pathological fracture   3. Acute pain due to trauma     Plan: Avoid frequent bending and stooping  No lifting greater than 10 lbs. May use ice or moist heat for pain. Weight loss is of benefit. Narcotics for pain is appropriate, as the tramadol is not of benefit it only sedates her. She is taking Exercise is important to improve your indurance and does allow people to function better inspite of back pain. She is on Tramadol, sertraline and gabapentin all may increase seratonin and potentiate a seratonin syndrome.  Probably should stop tramadol and take Tylenol #4 1-2 every 4-6 hours prn pain.  Heat or moist heat for back pain. PT for ADLs and mobilizing patient avoiding flexion and lifting weight. To decrease the pain should consider MRI and a kyphoplasty or vertebroplasty at the involved level primarily due to  Concerns of the side affects of her medication. Following treatment of this compression fracture when she is comfortable her bone density concerns need to be evaluated. Follow-Up Instructions: No follow-ups on file.   Orders:  No orders of the defined types were placed in this encounter.  No orders of the defined types were placed in this encounter.     Procedures: No procedures performed   Clinical Data: Findings:  Radiographs of the thoracic from 03/20/2018 thoracic spine show about 15 percent superior end plate compression fracture at T9 or T10 level. Radiographs of the ribs from 2/28 show AP of the thoracic spine with right sided superior end plate  compression fractureT10.    Subjective: Chief Complaint  Patient presents with  . Middle Back - Pain, Injury    Had a fall about 1.5 weeks ago and pain in her back that has been worsening daily,      85 year old female with 2 week history of mid lower thoracic pain following a fall that occurred in while in the assisted living community at [facility]. She was seen at [facility] ER on 03/13/2018 and xrays of the ribs was negative for fracture of the ribs and she continued to experience pain in the back so that a portable radiograph of the thoracic spine was done that showed acute thoracic compression fracture at the T9 level. No bowel and no bladder difficulty, she has some spasm on the bowels with intermittant bouts of increased  Bowel movements. She is allergic to milk products.    Review of Systems  Constitutional: Negative.  Negative for activity change, appetite change, chills, diaphoresis, fatigue, fever and unexpected weight change.  HENT: Negative.  Negative for congestion, dental problem, drooling, ear discharge, ear pain, facial swelling, hearing loss, mouth sores, nosebleeds, postnasal drip, rhinorrhea, sinus pressure, sinus pain, sneezing, sore throat, tinnitus, trouble swallowing and voice change.   Eyes: Negative.  Negative for photophobia, pain, discharge, redness, itching and visual disturbance.  Respiratory: Negative.  Negative for apnea, cough, choking, chest tightness, shortness of breath, wheezing and stridor.  Cardiovascular: Positive for chest pain. Negative for palpitations and leg swelling.  Gastrointestinal: Negative.  Negative for abdominal distention, abdominal pain, anal bleeding, blood in stool, constipation, diarrhea, nausea, rectal pain and vomiting.  Endocrine: Negative.  Negative for cold intolerance, heat intolerance, polydipsia, polyphagia and polyuria.  Genitourinary: Negative.  Negative for difficulty urinating, dyspareunia, dysuria, enuresis, flank pain,  frequency, genital sores and hematuria.  Musculoskeletal: Positive for back pain. Negative for arthralgias, gait problem, joint swelling, myalgias, neck pain and neck stiffness.  Skin: Negative.  Negative for color change, pallor, rash and wound.  Allergic/Immunologic: Negative.  Negative for environmental allergies, food allergies and immunocompromised state.  Neurological: Negative.  Negative for dizziness, tremors, seizures, syncope, facial asymmetry, speech difficulty, weakness, light-headedness, numbness and headaches.  Hematological: Negative.  Negative for adenopathy. Does not bruise/bleed easily.  Psychiatric/Behavioral: Negative.  Negative for agitation, behavioral problems, confusion, decreased concentration, dysphoric mood, hallucinations, self-injury, sleep disturbance and suicidal ideas. The patient is not nervous/anxious and is not hyperactive.      Objective: Vital Signs: BP (!) 158/72 (BP Location: Left Arm, Patient Position: Sitting)   Pulse 63   Ht 5\' 5"  (1.651 m)   Wt 130 lb (59 kg)   BMI 21.63 kg/m   Physical Exam  Back Exam   Tenderness  The patient is experiencing tenderness in the thoracic.  Range of Motion  Extension: normal  Flexion: abnormal  Lateral bend right: abnormal  Lateral bend left: abnormal  Rotation right: abnormal  Rotation left: abnormal   Muscle Strength  Right Quadriceps:  5/5  Left Quadriceps:  5/5  Right Hamstrings:  5/5  Left Hamstrings:  5/5   Tests  Straight leg raise right: positive Straight leg raise left: positive  Reflexes  Patellar: 0/4 Achilles: 0/4 Biceps: 0/4 Babinski's sign: normal   Other  Toe walk: abnormal Heel walk: abnormal Gait: normal  Erythema: no back redness Scars: absent      Specialty Comments:  No specialty comments available.  Imaging: No results found.   PMFS History: Patient Active Problem List   Diagnosis Date Noted  . Hyponatremia 12/18/2016  . Incomplete emptying of bladder  11/15/2016  . Prolapse of female genital organs 10/21/2016  . Recurrent UTI 10/21/2016  . Vaginal atrophy 10/21/2016  . Right sciatic nerve pain 07/08/2016  . Chronic nonseasonal allergic rhinitis due to pollen 02/09/2016  . Environmental allergies 02/09/2016  . Allergic rhinitis 11/29/2015  . Cough 11/29/2015  . History of food allergy 11/29/2015  . Primary insomnia 06/05/2015  . Subacute maxillary sinusitis 01/06/2015  . Chronic diarrhea 06/20/2014  . Chronic kidney disease (CKD) stage G3a/A1, moderately decreased glomerular filtration rate (GFR) between 45-59 mL/min/1.73 square meter and albuminuria creatinine ratio less than 30 mg/g (HCC) 11/30/2013  . Alopecia 03/17/2013  . Dermatitis 12/04/2012  . B-complex deficiency 11/19/2012  . Essential tremor 11/19/2012  . Duodenal ulcer with hemorrhage 05/20/2012  . Orthostatic tremor 02/19/2012  . Carotid stenosis, bilateral 09/17/2011  . Stenosis of carotid artery 09/17/2011  . Hyperlipidemia 04/16/2011  . Personal history of transient ischemic attack (TIA), and cerebral infarction without residual deficits 03/04/2011  . Vitamin D deficiency 09/20/2010  . Anxiety 08/08/2010  . Essential hypertension 08/08/2010  . Irritable bowel syndrome without diarrhea 08/08/2010  . Major depressive disorder, single episode 08/08/2010  . Spinal stenosis 08/08/2007   Past Medical History:  Diagnosis Date  . Anxiety   . Chronic kidney disease    pt denies, states chronic bladder infections  . Depression   .  Hyperlipidemia   . Hypertension   . IBS (irritable bowel syndrome)   . Orthostatic tremor   . Vitamin D deficiency     Family History  Problem Relation Age of Onset  . Heart attack Father   . Stroke Mother   . Stroke Brother   . Stroke Paternal Aunt   . Allergic rhinitis Neg Hx   . Angioedema Neg Hx   . Asthma Neg Hx   . Immunodeficiency Neg Hx   . Eczema Neg Hx   . Urticaria Neg Hx     Past Surgical History:  Procedure  Laterality Date  . lumbar spinal stenosis     Social History   Occupational History  . Occupation: retired    Comment: Administrator, part time  Tobacco Use  . Smoking status: Former Smoker    Packs/day: 0.25    Types: Cigarettes  . Smokeless tobacco: Never Used  . Tobacco comment: quit 30 years ago; social smoker  Substance and Sexual Activity  . Alcohol use: No    Alcohol/week: 0.0 standard drinks    Comment: rarely  . Drug use: No  . Sexual activity: Not on file

## 2018-03-24 NOTE — Patient Instructions (Addendum)
Avoid frequent bending and stooping  No lifting greater than 10 lbs. May use ice or moist heat for pain. Weight loss is of benefit. Narcotics for pain is appropriate, as the tramadol is not of benefit it only sedates her. She is taking Exercise is important to improve your indurance and does allow people to function better inspite of back pain. She is on Tramadol, sertraline and gabapentin all may increase seratonin and potentiate a seratonin syndrome.  Probably should stop tramadol and take Tylenol #4 1-2 every 4-6 hours prn pain.  Heat or moist heat for back pain. PT for ADLs and mobilizing patient avoiding flexion and lifting weight. To decrease the pain should consider MRI and a kyphoplasty or vertebroplasty at the involved level primarily due to  Concerns of the side affects of her medication. Following treatment of this compression fracture when she is comfortable her bone density concerns need to be evaluated.

## 2018-04-01 ENCOUNTER — Ambulatory Visit (HOSPITAL_COMMUNITY)
Admission: RE | Admit: 2018-04-01 | Discharge: 2018-04-01 | Disposition: A | Discharge status: Home / Self Care | Payer: Medicare Other | Source: Ambulatory Visit | Attending: Specialist | Admitting: Specialist

## 2018-04-01 ENCOUNTER — Other Ambulatory Visit: Payer: Self-pay

## 2018-04-01 DIAGNOSIS — G8911 Acute pain due to trauma: Secondary | ICD-10-CM | POA: Insufficient documentation

## 2018-04-02 ENCOUNTER — Telehealth (INDEPENDENT_AMBULATORY_CARE_PROVIDER_SITE_OTHER): Payer: Self-pay | Admitting: Radiology

## 2018-04-02 NOTE — Telephone Encounter (Signed)
Can you please call----Per Dr. Otelia Sergeant, Please call and sched post MRI appt with him

## 2018-04-22 ENCOUNTER — Telehealth (HOSPITAL_COMMUNITY): Payer: Self-pay | Admitting: Radiology

## 2018-04-22 NOTE — Telephone Encounter (Signed)
Called pt's daughter, Larita Fife to check on [name]. Recently had an MRI showing 2 fx at T9 and T10. Left VM for her to call me back and let me know how she is. Samantha Crane

## 2018-11-10 ENCOUNTER — Ambulatory Visit (HOSPITAL_COMMUNITY)
Admission: RE | Admit: 2018-11-10 | Discharge: 2018-11-10 | Disposition: A | Discharge status: Home / Self Care | Payer: Medicare Other | Source: Ambulatory Visit | Attending: Cardiovascular Disease | Admitting: Cardiovascular Disease

## 2018-11-10 ENCOUNTER — Other Ambulatory Visit: Payer: Self-pay

## 2018-11-10 DIAGNOSIS — I6523 Occlusion and stenosis of bilateral carotid arteries: Secondary | ICD-10-CM

## 2019-01-13 DIAGNOSIS — U071 COVID-19: Secondary | ICD-10-CM | POA: Insufficient documentation

## 2019-01-29 NOTE — Progress Notes (Deleted)
Virtual Visit via Video Note The purpose of this virtual visit is to provide medical care while limiting exposure to the novel coronavirus.    Consent was obtained for video visit:  {yes no:314532} Answered questions that patient had about telehealth interaction:  {yes no:314532} I discussed the limitations, risks, security and privacy concerns of performing an evaluation and management service by telemedicine. I also discussed with the patient that there may be a patient responsible charge related to this service. The patient expressed understanding and agreed to proceed.  Pt location: Home Physician Location: office Name of referring provider:  Nadara Eaton, MD I connected with Samantha Crane at patients initiation/request on 02/02/2019 at  1:30 PM EST by video enabled telemedicine application and verified that I am speaking with the correct person using two identifiers. Pt MRN:  [id number] Pt DOB:  [birth year] Video Participants:  Samantha Crane;  ***   History of Present Illness: *** Patient seen today for tremor.  I saw her in 2016 for possible orthostatic tremor, although her history was really not completely consistent with that.  I have reviewed records that are made available to me.  Patient did test Covid positive on January 7, so this was done through video.  She was moved to the nursing facility at [facility] because of this.  How long has it been going on? *** At rest or with activation?  *** When is it noted the most?  *** Fam hx of tremor?  {yes AO:130865} Located where?  *** Affected by caffeine:  {yes no:314532} Affected by alcohol:  {yes no:314532} Affected by stress:  {yes no:314532} Affected by fatigue:  {yes no:314532} Spills soup if on spoon:  {yes no:314532} Spills glass of liquid if full:  {yes no:314532} Affects ADL's (tying shoes, brushing teeth, etc):  {yes no:314532}    Current movement d/o meds:  ***   Current Outpatient Medications on File  Prior to Visit  Medication Sig Dispense Refill  . acetaminophen (TYLENOL) 325 MG tablet Take 650 mg by mouth every 4 (four) hours as needed.    Marland Kitchen acetaminophen-codeine (TYLENOL #4) 300-60 MG tablet Take 1-2 tablets by mouth every 4 (four) hours as needed for moderate pain. 30 tablet 0  . albuterol (PROVENTIL HFA;VENTOLIN HFA) 108 (90 Base) MCG/ACT inhaler Inhale 2 puffs into the lungs every 6 (six) hours as needed.    Marland Kitchen atorvastatin (LIPITOR) 10 MG tablet Take 10 mg by mouth daily.    Marland Kitchen b complex vitamins tablet Take by mouth.    . benzonatate (TESSALON) 100 MG capsule Take 100 mg by mouth as directed.    Marland Kitchen buPROPion (WELLBUTRIN XL) 150 MG 24 hr tablet Take 150 mg by mouth 2 (two) times daily.     . Calcium Carb-Cholecalciferol (CALCIUM-VITAMIN D) 500-200 MG-UNIT tablet 2 (two) times a day with meals. Frequency:Daily   Dosage:1   MG-UNIT  Instructions:  Note:TAKE 1 TABLET DAILY AS DIRECTED.    . cephALEXin (KEFLEX) 250 MG capsule Take 250 mg by mouth daily.     . cetirizine (ZYRTEC) 10 MG tablet Take 10 mg by mouth daily.     . cholestyramine (QUESTRAN) 4 g packet MIX ONE PACKET AND DRINK TWICE A DAY WITH MEALS    . clonazePAM (KLONOPIN) 0.5 MG tablet Take 0.5 mg by mouth daily.     . Cyanocobalamin (VITAMIN B-12 SL) Place 5,000 mcg under the tongue daily.    . ferrous sulfate 325 (65 FE) MG tablet Take 325 mg  by mouth daily with breakfast.    . fluticasone (FLONASE) 50 MCG/ACT nasal spray Place 2 sprays into both nostrils daily as needed for allergies or rhinitis. 16 g 3  . gabapentin (NEURONTIN) 300 MG capsule Take 300 mg by mouth 2 (two) times daily.     Marland Kitchen lisinopril (PRINIVIL,ZESTRIL) 20 MG tablet Take 20 mg by mouth daily.    . mirtazapine (REMERON) 30 MG tablet Take 30 mg by mouth at bedtime.    . Multiple Vitamin (MULTI-VITAMINS) TABS Take 1 tablet by mouth daily.     . pantoprazole (PROTONIX) 40 MG tablet Take 40 mg by mouth daily.    . sertraline (ZOLOFT) 50 MG tablet Take 100 mg by  mouth daily.     . valACYclovir (VALTREX) 1000 MG tablet Take 1 g by mouth every 8 (eight) hours as needed (outbreak).     . zaleplon (SONATA) 10 MG capsule Take 10 mg by mouth at bedtime as needed.      No current facility-administered medications on file prior to visit.     Observations/Objective:   There were no vitals filed for this visit. GEN:  The patient appears stated age and is in NAD.  Neurological examination:  Orientation: The patient is alert and oriented x3. Cranial nerves: There is good facial symmetry. There is ***facial hypomimia.  The speech is fluent and clear. Soft palate rises symmetrically and there is no tongue deviation. Hearing is intact to conversational tone. Motor: Strength is at least antigravity x 4.   Shoulder shrug is equal and symmetric.  There is no pronator drift.  Movement examination: Tone: unable Abnormal movements: *** Coordination:  There is *** decremation with RAM's, *** Gait and Station: The patient has *** difficulty arising out of a deep-seated chair without the use of the hands. The patient's stride length is ***.      Assessment and Plan:     Follow Up Instructions:    -I discussed the assessment and treatment plan with the patient. The patient was provided an opportunity to ask questions and all were answered. The patient agreed with the plan and demonstrated an understanding of the instructions.   The patient was advised to call back or seek an in-person evaluation if the symptoms worsen or if the condition fails to improve as anticipated.    Total time spent on today's visit was ***minutes, including both face-to-face time and nonface-to-face time.  Time included that spent on review of records (prior notes available to me/labs/imaging if pertinent), discussing treatment and goals, answering patient's questions and coordinating care.   Alonza Bogus, DO

## 2019-02-02 ENCOUNTER — Ambulatory Visit: Payer: Medicare Other | Admitting: Neurology

## 2019-02-25 ENCOUNTER — Ambulatory Visit: Payer: Medicare Other | Admitting: Neurology

## 2019-03-08 ENCOUNTER — Emergency Department (HOSPITAL_COMMUNITY): Payer: Medicare Other

## 2019-03-08 ENCOUNTER — Other Ambulatory Visit: Payer: Self-pay

## 2019-03-08 ENCOUNTER — Emergency Department (HOSPITAL_COMMUNITY)
Admission: EM | Admit: 2019-03-08 | Discharge: 2019-03-08 | Disposition: A | Discharge status: Skilled Nursing Facility | Payer: Medicare Other | Attending: Emergency Medicine | Admitting: Emergency Medicine

## 2019-03-08 ENCOUNTER — Encounter (HOSPITAL_COMMUNITY): Payer: Self-pay | Admitting: Emergency Medicine

## 2019-03-08 DIAGNOSIS — S0101XA Laceration without foreign body of scalp, initial encounter: Secondary | ICD-10-CM | POA: Diagnosis not present

## 2019-03-08 DIAGNOSIS — Y929 Unspecified place or not applicable: Secondary | ICD-10-CM | POA: Diagnosis not present

## 2019-03-08 DIAGNOSIS — Y9389 Activity, other specified: Secondary | ICD-10-CM | POA: Diagnosis not present

## 2019-03-08 DIAGNOSIS — W01198A Fall on same level from slipping, tripping and stumbling with subsequent striking against other object, initial encounter: Secondary | ICD-10-CM | POA: Diagnosis not present

## 2019-03-08 DIAGNOSIS — Y999 Unspecified external cause status: Secondary | ICD-10-CM | POA: Diagnosis not present

## 2019-03-08 DIAGNOSIS — I10 Essential (primary) hypertension: Secondary | ICD-10-CM | POA: Insufficient documentation

## 2019-03-08 DIAGNOSIS — S0990XA Unspecified injury of head, initial encounter: Secondary | ICD-10-CM

## 2019-03-08 MED ORDER — LIDOCAINE-EPINEPHRINE 2 %-1:100000 IJ SOLN
20.0000 mL | Freq: Once | INTRAMUSCULAR | Status: AC
  Administered 2019-03-08: 20 mL via INTRADERMAL
  Filled 2019-03-08: qty 1

## 2019-03-08 NOTE — ED Provider Notes (Signed)
[facility] DEPT Provider Note   CSN: [id number] Arrival date & time: 03/08/19  1327     History Chief Complaint  Patient presents with  . Fall  . Laceration    Samantha Crane is a 86 y.o. female.  HPI Patient presents to the emergency department with injuries following a fall.  Patient states that she must of fallen against a magazine rack that she has.  She states that she has a cut to the back of her head patient states she did not lose consciousness.  Patient denies blurred vision, headache, back pain, chest pain, shortness of breath, nausea, vomiting, weakness, dizziness or syncope.    Past Medical History:  Diagnosis Date  . Anxiety   . Chronic kidney disease    pt denies, states chronic bladder infections  . Depression   . Hyperlipidemia   . Hypertension   . IBS (irritable bowel syndrome)   . Orthostatic tremor   . Vitamin D deficiency     Patient Active Problem List   Diagnosis Date Noted  . Hyponatremia 12/18/2016  . Incomplete emptying of bladder 11/15/2016  . Prolapse of female genital organs 10/21/2016  . Recurrent UTI 10/21/2016  . Vaginal atrophy 10/21/2016  . Right sciatic nerve pain 07/08/2016  . Chronic nonseasonal allergic rhinitis due to pollen 02/09/2016  . Environmental allergies 02/09/2016  . Allergic rhinitis 11/29/2015  . Cough 11/29/2015  . History of food allergy 11/29/2015  . Primary insomnia 06/05/2015  . Subacute maxillary sinusitis 01/06/2015  . Chronic diarrhea 06/20/2014  . Chronic kidney disease (CKD) stage G3a/A1, moderately decreased glomerular filtration rate (GFR) between 45-59 mL/min/1.73 square meter and albuminuria creatinine ratio less than 30 mg/g 11/30/2013  . Alopecia 03/17/2013  . Dermatitis 12/04/2012  . B-complex deficiency 11/19/2012  . Essential tremor 11/19/2012  . Duodenal ulcer with hemorrhage 05/20/2012  . Orthostatic tremor 02/19/2012  . Carotid stenosis, bilateral 09/17/2011  .  Stenosis of carotid artery 09/17/2011  . Hyperlipidemia 04/16/2011  . Personal history of transient ischemic attack (TIA), and cerebral infarction without residual deficits 03/04/2011  . Vitamin D deficiency 09/20/2010  . Anxiety 08/08/2010  . Essential hypertension 08/08/2010  . Irritable bowel syndrome without diarrhea 08/08/2010  . Major depressive disorder, single episode 08/08/2010  . Spinal stenosis 08/08/2007    Past Surgical History:  Procedure Laterality Date  . lumbar spinal stenosis       OB History   No obstetric history on file.     Family History  Problem Relation Age of Onset  . Heart attack Father   . Stroke Mother   . Stroke Brother   . Stroke Paternal Aunt   . Allergic rhinitis Neg Hx   . Angioedema Neg Hx   . Asthma Neg Hx   . Immunodeficiency Neg Hx   . Eczema Neg Hx   . Urticaria Neg Hx     Social History   Tobacco Use  . Smoking status: Former Smoker    Packs/day: 0.25    Types: Cigarettes  . Smokeless tobacco: Never Used  . Tobacco comment: quit 30 years ago; social smoker  Substance Use Topics  . Alcohol use: No    Alcohol/week: 0.0 standard drinks    Comment: rarely  . Drug use: No    Home Medications Prior to Admission medications   Medication Sig Start Date End Date Taking? Authorizing Provider  acetaminophen (TYLENOL) 325 MG tablet Take 650 mg by mouth every 4 (four) hours as needed.  [provider]  acetaminophen-codeine (TYLENOL #4) 300-60 MG tablet Take 1-2 tablets by mouth every 4 (four) hours as needed for moderate pain. 03/24/18   Kerrin Champagne, MD  albuterol (PROVENTIL HFA;VENTOLIN HFA) 108 (90 Base) MCG/ACT inhaler Inhale 2 puffs into the lungs every 6 (six) hours as needed. 11/29/15   [provider]  atorvastatin (LIPITOR) 10 MG tablet Take 10 mg by mouth daily.    [provider]  b complex vitamins tablet Take by mouth.    [provider]  benzonatate (TESSALON) 100 MG capsule Take  100 mg by mouth as directed.    [provider]  buPROPion (WELLBUTRIN XL) 150 MG 24 hr tablet Take 150 mg by mouth 2 (two) times daily.  11/30/16   [provider]  Calcium Carb-Cholecalciferol (CALCIUM-VITAMIN D) 500-200 MG-UNIT tablet 2 (two) times a day with meals. Frequency:Daily   Dosage:1   MG-UNIT  Instructions:  Note:TAKE 1 TABLET DAILY AS DIRECTED. 04/27/12   [provider]  cephALEXin (KEFLEX) 250 MG capsule Take 250 mg by mouth daily.  06/27/17   [provider]  cetirizine (ZYRTEC) 10 MG tablet Take 10 mg by mouth daily.     [provider]  cholestyramine (QUESTRAN) 4 g packet MIX ONE PACKET AND DRINK TWICE A DAY WITH MEALS 07/17/15   [provider]  clonazePAM (KLONOPIN) 0.5 MG tablet Take 0.5 mg by mouth daily.  01/02/17   [provider]  Cyanocobalamin (VITAMIN B-12 SL) Place 5,000 mcg under the tongue daily.    [provider]  ferrous sulfate 325 (65 FE) MG tablet Take 325 mg by mouth daily with breakfast.    [provider]  fluticasone (FLONASE) 50 MCG/ACT nasal spray Place 2 sprays into both nostrils daily as needed for allergies or rhinitis. 08/13/17   Bobbitt, Heywood Iles, MD  gabapentin (NEURONTIN) 300 MG capsule Take 300 mg by mouth 2 (two) times daily.     [provider]  lisinopril (PRINIVIL,ZESTRIL) 20 MG tablet Take 20 mg by mouth daily.    [provider]  mirtazapine (REMERON) 30 MG tablet Take 30 mg by mouth at bedtime.    [provider]  Multiple Vitamin (MULTI-VITAMINS) TABS Take 1 tablet by mouth daily.     [provider]  pantoprazole (PROTONIX) 40 MG tablet Take 40 mg by mouth daily.    [provider]  sertraline (ZOLOFT) 50 MG tablet Take 100 mg by mouth daily.     [provider]  valACYclovir (VALTREX) 1000 MG tablet Take 1 g by mouth every 8 (eight) hours as needed (outbreak).  06/21/16   [provider]  zaleplon  (SONATA) 10 MG capsule Take 10 mg by mouth at bedtime as needed.  12/05/16   [provider]    Allergies    Prednisone, Amlodipine, Amoxicillin, Amoxicillin-pot clavulanate, Limonene, and Sulfa antibiotics  Review of Systems   Review of Systems All other systems negative except as documented in the HPI. All pertinent positives and negatives as reviewed in the HPI. Physical Exam Updated Vital Signs BP (!) 163/83   Pulse 66   Temp 97.7 F (36.5 C) (Oral)   Resp 11   SpO2 96%   Physical Exam Vitals and nursing note reviewed.  Constitutional:      General: She is not in acute distress.    Appearance: She is well-developed.  HENT:     Head: Normocephalic and atraumatic.  Eyes:  Pupils: Pupils are equal, round, and reactive to light.  Cardiovascular:     Rate and Rhythm: Normal rate and regular rhythm.     Heart sounds: Normal heart sounds. No murmur. No friction rub. No gallop.   Pulmonary:     Effort: Pulmonary effort is normal. No respiratory distress.     Breath sounds: Normal breath sounds. No wheezing.  Musculoskeletal:     Cervical back: Normal range of motion and neck supple.  Skin:    General: Skin is warm and dry.     Capillary Refill: Capillary refill takes less than 2 seconds.     Findings: No erythema or rash.  Neurological:     Mental Status: She is alert and oriented to person, place, and time.     Motor: No abnormal muscle tone.     Coordination: Coordination normal.  Psychiatric:        Behavior: Behavior normal.     ED Results / Procedures / Treatments   Labs (all labs ordered are listed, but only abnormal results are displayed) Labs Reviewed - No data to display  EKG None  Radiology No results found.  Procedures Procedures (including critical care time)  Medications Ordered in ED Medications  lidocaine-EPINEPHrine (XYLOCAINE W/EPI) 2 %-1:100000 (with pres) injection 20 mL (has no administration in time range)    ED Course    I have reviewed the triage vital signs and the nursing notes.  Pertinent labs & imaging results that were available during my care of the patient were reviewed by me and considered in my medical decision making (see chart for details).    MDM Rules/Calculators/A&P                      LACERATION REPAIR Performed by: Carlyle Dolly Authorized by: Jamesetta Orleans Rigdon Macomber Consent: Verbal consent obtained. Risks and benefits: risks, benefits and alternatives were discussed Consent given by: patient Patient identity confirmed: provided demographic data Prepped and Draped in normal sterile fashion Wound explored  Laceration Location: Top of the head mid point   Laceration Length: 2.5 cm  No Foreign Bodies seen or palpated  Anesthesia: local infiltration  Local anesthetic: lidocaine 2% w epinephrine  Anesthetic total: 5 ml  Irrigation method: syringe Amount of cleaning: standard  Skin closure: staples  Number of sutures: 4  Technique: stapling  Patient tolerance: Patient tolerated the procedure well with no immediate complications.   We will await the patient CT scan results.  But the patient is mentating appropriately with no distress.  Patient advised of the plan and all questions were answered. Final Clinical Impression(s) / ED Diagnoses Final diagnoses:  None    Rx / DC Orders ED Discharge Orders    None       Charlestine Night, [name] 03/08/19 1513    Tegeler, Canary Brim, MD 03/09/19 [phone]

## 2019-03-08 NOTE — ED Notes (Signed)
[facility] called  

## 2019-03-08 NOTE — ED Triage Notes (Signed)
The patient is from [facility] where she fell and hit her head today. She has a laceration in the back of her head. She denies pain and denies blood thinners.   EMS vitals: 150/90 BP 72 HR 18 RR 96% O2 sat on room air 98.4 Temp

## 2019-03-08 NOTE — Discharge Instructions (Addendum)
Have staples out in 7 days.  Avoid brushing directly over the area.  Do not vigorously scrub over the area.  Return here as needed.

## 2019-03-22 ENCOUNTER — Telehealth: Payer: Self-pay

## 2019-03-22 NOTE — Telephone Encounter (Signed)
Left message for nursing home to send over most current medication list for upcoming appointment.

## 2019-03-23 NOTE — Progress Notes (Signed)
Samantha Crane was seen today in the movement disorders clinic for neurologic consultation at the request of Nadara Eaton, MD. Patient seen today for tremor.  I saw her in 2016 for possible orthostatic tremor, although her history was really not completely consistent with that.  I have reviewed records that are made available to me.  She was supposed to see me back in January, but she got Covid and ended up in the nursing facility at [facility], so rescheduled the visit.  Tremor: Yes.     How long has it been going on? 6 months  At rest or with activation?  activation  When is it noted the most?  After working hard with rehab, "I kind of shake all over"  Fam hx of tremor?  No.  Located where?  "my whole body"  Affected by caffeine:  Doesn't drink any  Affected by alcohol: doesn't drink any  Affected by stress:  No.  Affected by fatigue:  Yes.  , if physically tired  Spills soup if on spoon:  No.  Spills glass of liquid if full:  No.  Affects ADL's (tying shoes, brushing teeth, etc):  No.  Tremor inducing meds:  No.   -pt states that tremor really only bothers her when trying to write checks  Other Specific Symptoms:  Voice: no changes Sleep: sleeping well  Vivid Dreams:  No.  Acting out dreams:  No. Wet Pillows: Yes.   , a little Postural symptoms:  Yes.    Falls?  Yes.   she was in the emergency room on February 22 with a fall.  I reviewed those records.  Stated that she fell against a magazine rack and cut the back of her head.  No loss of consciousness.  CT of the brain was completed and demonstrated age-related changes/atrophy/small vessel disease.  I personally reviewed that.  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   Bradykinesia symptoms: difficulty getting out of a chair (but much better with PT); denies shuffling; uses walker now that she fell Loss of smell:  No. Loss of taste:  No. Urinary Incontinence:   Yes.  , has some bladder incontinence at night when gets up to use the bathroom.  Does pretty well in the day but wears a pad Difficulty Swallowing:  No. Handwriting, micrographia: No. but admits to having a shaky handwriting Trouble with ADL's:  No.  Trouble buttoning clothing: No. Depression:  Yes.  , some related to death of husband few years ago and covid Memory:  Lives in assisted living.  meds and meals are done for her.  She does her own shower/adl's.  She does not drive.   Hallucinations:  No.  visual distortions: No. Diplopia:  No.   ALLERGIES:   Allergies  Allergen Reactions  . Prednisone     Other reaction(s): Other (See Comments) Manic tendencies  . Amlodipine Swelling  . Amoxicillin   . Amoxicillin-Pot Clavulanate Diarrhea  . Limonene Diarrhea  . Sulfa Antibiotics     CURRENT MEDICATIONS:  Current Outpatient Medications  Medication Instructions  . acetaminophen (TYLENOL) 650 mg, Oral, Every 4 hours PRN  . albuterol (PROVENTIL HFA;VENTOLIN HFA) 108 (90 Base) MCG/ACT inhaler 2 puffs, Inhalation, Every 6 hours PRN  . atorvastatin (LIPITOR) 10 mg, Oral, Daily  . benzonatate (TESSALON) 100 mg, Oral, Every 8 hours PRN  . buPROPion (WELLBUTRIN SR) 150 mg, Oral, 2 times daily  .  Calcium Carb-Cholecalciferol (CALCIUM-VITAMIN D) 500-200 MG-UNIT tablet 1 tablet, Oral, 2 times daily  . cephALEXin (KEFLEX) 250 mg, Oral, Daily  . cetirizine (ZYRTEC) 5 mg, Oral, Daily  . clonazePAM (KLONOPIN) 0.25 mg, Oral, 3 times daily, 0800 1400 2000  . ferrous sulfate 325 mg, Oral, Daily with breakfast  . fluticasone (FLONASE) 50 MCG/ACT nasal spray 2 sprays, Each Nare, Daily PRN  . gabapentin (NEURONTIN) 300 mg, Oral, 2 times daily  . gabapentin (NEURONTIN) 100 mg, Oral, Daily, @@1400   . guaiFENesin (MUCINEX) 600 mg, Oral, 2 times daily PRN  . lisinopril (ZESTRIL) 20 mg, Oral, Daily  . mirtazapine (REMERON) 15 mg, Oral, Daily at bedtime  . Multiple Vitamin (MULTI-VITAMINS) TABS 1  tablet, Oral, Daily  . pantoprazole (PROTONIX) 40 mg, Oral, Daily  . polyethylene glycol (MIRALAX / GLYCOLAX) 17 g, Oral, Daily PRN  . sennosides-docusate sodium (SENOKOT-S) 8.6-50 MG tablet 1 tablet, Oral, Daily  . sertraline (ZOLOFT) 100 mg, Oral, Daily  . valACYclovir (VALTREX) 1 g, Oral, Every 8 hours PRN  . zaleplon (SONATA) 10 mg, Oral, At bedtime PRN    PHYSICAL EXAMINATION:    VITALS:   Vitals:   03/25/19 1320  BP: (!) 164/74  Pulse: 82  SpO2: 94%  Weight: 134 lb (60.8 kg)  Height: 5\' 5"  (1.651 m)    GEN:  The patient appears stated age and is in NAD. HEENT:  Normocephalic, atraumatic.  The mucous membranes are moist. The superficial temporal arteries are without ropiness or tenderness. CV:  RRR Lungs:  CTAB Neck/HEME:  There are no carotid bruits bilaterally.  Neurological examination:  Orientation: The patient is alert and oriented x3.  Cranial nerves: There is good facial symmetry.  Extraocular muscles are intact. The visual fields are full to confrontational testing. The speech is fluent and clear. Soft palate rises symmetrically and there is no tongue deviation. Hearing is intact to conversational tone. Sensation: Sensation is intact to light touch throughout (facial, trunk, extremities). Vibration is intact at the bilateral big toe. There is no extinction with double simultaneous stimulation.  Motor: Strength is 5/5 in the bilateral upper and lower extremities.   Shoulder shrug is equal and symmetric.  There is no pronator drift. Deep tendon reflexes: Deep tendon reflexes are 2/4 at the bilateral biceps, triceps, brachioradialis, 3+ at the bilateral patella and achilles.  There are bilateral cross adductor reflexes.   Plantar responses are downgoing bilaterally.  Movement examination: Tone: There is normal tone in the bilateral upper extremities.  The tone in the lower extremities is normal.  Abnormal movements: There is rare low amplitude rest tremor.  There is  very rare postural tremor.  She has no trouble with Archimedes spirals.  She asks if she can write her name on a piece of paper, and actually does it very well.  She also writes it in cursive, and also writes that very well.  She states that "it just did not do it today." Coordination:  There is no decremation with RAM's, with any form of RAMS, including alternating supination and pronation of the forearm, hand opening and closing, finger taps, heel taps and toe taps. Gait and Station: The patient pushes off to arise.  She is short stepped with good arm swing.  She drags the L leg.    ASSESSMENT/PLAN:  1.  Tremor  -rare tremor noted today in the bilateral UE.  It is low amplitude and resting and postural.  No evidence of Parkinsons Disease today via [place] brain bank.  Patient wants no treatment.  2.  Hyperreflexia  -very extensive LE hyperreflexia compared to the past.  She has crossed adductor reflexes.  With the falls, we should pursue MRI lumbar spine.  We decided to hold off on MRI of the brain, given that the reflexes in the upper extremities were reasonable.  Also, she has had fairly recent CTs of the brain  3.  Hx of TIA  -she asks me about this.  I am really unfamiliar with her history of TIA.  She asks me about a carotid ultrasound that she needs done.  I saw that she last had one done in October, 2020.  It does not appear that she is due for it.  It appears that Dr. Duke Salvia is following it.  Total time spent on today's visit was 60 minutes, including both face-to-face time and nonface-to-face time.  Time included that spent on review of records (prior notes available to me/labs/imaging if pertinent), discussing treatment and goals, answering patient's questions and coordinating care.  Cc:  Nadara Eaton, MD

## 2019-03-25 ENCOUNTER — Other Ambulatory Visit: Payer: Self-pay

## 2019-03-25 ENCOUNTER — Encounter: Payer: Self-pay | Admitting: Neurology

## 2019-03-25 ENCOUNTER — Ambulatory Visit (INDEPENDENT_AMBULATORY_CARE_PROVIDER_SITE_OTHER): Payer: Medicare Other | Admitting: Neurology

## 2019-03-25 VITALS — BP 164/74 | HR 82 | Ht 65.0 in | Wt 134.0 lb

## 2019-03-25 DIAGNOSIS — Z8673 Personal history of transient ischemic attack (TIA), and cerebral infarction without residual deficits: Secondary | ICD-10-CM

## 2019-03-25 DIAGNOSIS — R251 Tremor, unspecified: Secondary | ICD-10-CM | POA: Diagnosis not present

## 2019-03-25 DIAGNOSIS — W19XXXD Unspecified fall, subsequent encounter: Secondary | ICD-10-CM

## 2019-03-25 DIAGNOSIS — R292 Abnormal reflex: Secondary | ICD-10-CM | POA: Diagnosis not present

## 2019-03-25 NOTE — Patient Instructions (Addendum)
Follow up with be determined after MRI Lumbar Spine.

## 2019-03-29 NOTE — Progress Notes (Signed)
Monegro, Jacques Navy, CMA  [employer]       Previous Messages

## 2019-03-31 ENCOUNTER — Other Ambulatory Visit: Payer: Self-pay

## 2019-03-31 ENCOUNTER — Telehealth: Payer: Self-pay | Admitting: Neurology

## 2019-03-31 ENCOUNTER — Ambulatory Visit
Admission: RE | Admit: 2019-03-31 | Discharge: 2019-03-31 | Disposition: A | Discharge status: Home / Self Care | Payer: Medicare Other | Source: Ambulatory Visit | Attending: Neurology | Admitting: Neurology

## 2019-03-31 NOTE — Telephone Encounter (Signed)
Left message for patient to contact office to discuss MRI results.

## 2019-03-31 NOTE — Telephone Encounter (Signed)
Let pt know that MRI lumbar spine generally is ok.  There are changes related to prior surgeries of her back and still some newer degenerative changes in the back but nothing that requires surgical intervention at this time.  She didn't have hyperreflexia in arms so won't scan neck at this time unless new sx's develop.

## 2019-04-01 NOTE — Telephone Encounter (Signed)
Left message on patients voicemail informing her that her MRI was ok but advised her to contact office to discuss results.  Left message on patients daughter, Larita Fife, voicemail as well advising her to contact the office.

## 2019-04-02 NOTE — Telephone Encounter (Signed)
Left Dr Iona Beard recommendations on patients daughters voicemail. Advised her to contact office with any questions or concerns.

## 2019-04-05 NOTE — Telephone Encounter (Signed)
Checked voice mail and patients daughter  Derry Lory left message returning my call. See previous message this issue has been addressed.

## 2019-08-12 ENCOUNTER — Ambulatory Visit: Payer: Medicare Other | Admitting: Cardiovascular Disease

## 2019-08-18 ENCOUNTER — Ambulatory Visit: Payer: Medicare Other | Admitting: Cardiovascular Disease

## 2020-05-23 ENCOUNTER — Encounter: Payer: Self-pay | Admitting: Cardiovascular Disease

## 2020-07-27 ENCOUNTER — Ambulatory Visit (HOSPITAL_BASED_OUTPATIENT_CLINIC_OR_DEPARTMENT_OTHER): Payer: Medicare Other | Admitting: Cardiovascular Disease

## 2020-08-18 ENCOUNTER — Encounter (HOSPITAL_COMMUNITY): Payer: Self-pay

## 2020-08-18 ENCOUNTER — Inpatient Hospital Stay (HOSPITAL_COMMUNITY)
Admission: EM | Admit: 2020-08-18 | Discharge: 2020-08-23 | DRG: 871 | Disposition: A | Discharge status: Skilled Nursing Facility | Payer: Medicare Other | Source: Skilled Nursing Facility | Attending: Internal Medicine | Admitting: Internal Medicine

## 2020-08-18 ENCOUNTER — Emergency Department (HOSPITAL_COMMUNITY): Payer: Medicare Other

## 2020-08-18 ENCOUNTER — Other Ambulatory Visit: Payer: Self-pay

## 2020-08-18 ENCOUNTER — Observation Stay (HOSPITAL_COMMUNITY): Payer: Medicare Other

## 2020-08-18 DIAGNOSIS — E876 Hypokalemia: Secondary | ICD-10-CM | POA: Diagnosis present

## 2020-08-18 DIAGNOSIS — N1831 Chronic kidney disease, stage 3a: Secondary | ICD-10-CM | POA: Diagnosis present

## 2020-08-18 DIAGNOSIS — J9621 Acute and chronic respiratory failure with hypoxia: Secondary | ICD-10-CM | POA: Diagnosis not present

## 2020-08-18 DIAGNOSIS — R0902 Hypoxemia: Secondary | ICD-10-CM | POA: Diagnosis present

## 2020-08-18 DIAGNOSIS — Z881 Allergy status to other antibiotic agents status: Secondary | ICD-10-CM

## 2020-08-18 DIAGNOSIS — I5031 Acute diastolic (congestive) heart failure: Secondary | ICD-10-CM | POA: Diagnosis not present

## 2020-08-18 DIAGNOSIS — N390 Urinary tract infection, site not specified: Secondary | ICD-10-CM | POA: Diagnosis present

## 2020-08-18 DIAGNOSIS — K589 Irritable bowel syndrome without diarrhea: Secondary | ICD-10-CM | POA: Diagnosis present

## 2020-08-18 DIAGNOSIS — Z880 Allergy status to penicillin: Secondary | ICD-10-CM

## 2020-08-18 DIAGNOSIS — F32A Depression, unspecified: Secondary | ICD-10-CM | POA: Diagnosis present

## 2020-08-18 DIAGNOSIS — Z823 Family history of stroke: Secondary | ICD-10-CM

## 2020-08-18 DIAGNOSIS — Z79899 Other long term (current) drug therapy: Secondary | ICD-10-CM

## 2020-08-18 DIAGNOSIS — Z87891 Personal history of nicotine dependence: Secondary | ICD-10-CM

## 2020-08-18 DIAGNOSIS — I10 Essential (primary) hypertension: Secondary | ICD-10-CM | POA: Diagnosis present

## 2020-08-18 DIAGNOSIS — A419 Sepsis, unspecified organism: Principal | ICD-10-CM | POA: Diagnosis present

## 2020-08-18 DIAGNOSIS — E785 Hyperlipidemia, unspecified: Secondary | ICD-10-CM | POA: Diagnosis present

## 2020-08-18 DIAGNOSIS — I5032 Chronic diastolic (congestive) heart failure: Secondary | ICD-10-CM | POA: Diagnosis not present

## 2020-08-18 DIAGNOSIS — I5033 Acute on chronic diastolic (congestive) heart failure: Secondary | ICD-10-CM | POA: Diagnosis present

## 2020-08-18 DIAGNOSIS — E739 Lactose intolerance, unspecified: Secondary | ICD-10-CM | POA: Diagnosis present

## 2020-08-18 DIAGNOSIS — I130 Hypertensive heart and chronic kidney disease with heart failure and stage 1 through stage 4 chronic kidney disease, or unspecified chronic kidney disease: Secondary | ICD-10-CM | POA: Diagnosis present

## 2020-08-18 DIAGNOSIS — Z888 Allergy status to other drugs, medicaments and biological substances status: Secondary | ICD-10-CM

## 2020-08-18 DIAGNOSIS — Z20822 Contact with and (suspected) exposure to covid-19: Secondary | ICD-10-CM | POA: Diagnosis present

## 2020-08-18 DIAGNOSIS — F419 Anxiety disorder, unspecified: Secondary | ICD-10-CM | POA: Diagnosis present

## 2020-08-18 DIAGNOSIS — Z8673 Personal history of transient ischemic attack (TIA), and cerebral infarction without residual deficits: Secondary | ICD-10-CM

## 2020-08-18 DIAGNOSIS — Z882 Allergy status to sulfonamides status: Secondary | ICD-10-CM

## 2020-08-18 DIAGNOSIS — L03116 Cellulitis of left lower limb: Secondary | ICD-10-CM | POA: Diagnosis not present

## 2020-08-18 DIAGNOSIS — Z66 Do not resuscitate: Secondary | ICD-10-CM | POA: Diagnosis present

## 2020-08-18 DIAGNOSIS — Z8249 Family history of ischemic heart disease and other diseases of the circulatory system: Secondary | ICD-10-CM

## 2020-08-18 LAB — CBC WITH DIFFERENTIAL/PLATELET
Abs Immature Granulocytes: 0.22 10*3/uL — ABNORMAL HIGH (ref 0.00–0.07)
Basophils Absolute: 0 10*3/uL (ref 0.0–0.1)
Basophils Relative: 0 %
Eosinophils Absolute: 0.1 10*3/uL (ref 0.0–0.5)
Eosinophils Relative: 0 %
HCT: 39.5 % (ref 36.0–46.0)
Hemoglobin: 13.1 g/dL (ref 12.0–15.0)
Immature Granulocytes: 1 %
Lymphocytes Relative: 2 %
Lymphs Abs: 0.4 10*3/uL — ABNORMAL LOW (ref 0.7–4.0)
MCH: 31.3 pg (ref 26.0–34.0)
MCHC: 33.2 g/dL (ref 30.0–36.0)
MCV: 94.5 fL (ref 80.0–100.0)
Monocytes Absolute: 0.6 10*3/uL (ref 0.1–1.0)
Monocytes Relative: 3 %
Neutro Abs: 20.4 10*3/uL — ABNORMAL HIGH (ref 1.7–7.7)
Neutrophils Relative %: 94 %
Platelets: 227 10*3/uL (ref 150–400)
RBC: 4.18 MIL/uL (ref 3.87–5.11)
RDW: 12.5 % (ref 11.5–15.5)
WBC: 21.8 10*3/uL — ABNORMAL HIGH (ref 4.0–10.5)
nRBC: 0 % (ref 0.0–0.2)

## 2020-08-18 LAB — URINALYSIS, ROUTINE W REFLEX MICROSCOPIC
Bilirubin Urine: NEGATIVE
Glucose, UA: NEGATIVE mg/dL
Hgb urine dipstick: NEGATIVE
Ketones, ur: NEGATIVE mg/dL
Nitrite: NEGATIVE
Protein, ur: NEGATIVE mg/dL
Specific Gravity, Urine: 1.01 (ref 1.005–1.030)
pH: 7 (ref 5.0–8.0)

## 2020-08-18 LAB — COMPREHENSIVE METABOLIC PANEL
ALT: 19 U/L (ref 0–44)
AST: 26 U/L (ref 15–41)
Albumin: 4.5 g/dL (ref 3.5–5.0)
Alkaline Phosphatase: 87 U/L (ref 38–126)
Anion gap: 10 (ref 5–15)
BUN: 27 mg/dL — ABNORMAL HIGH (ref 8–23)
CO2: 30 mmol/L (ref 22–32)
Calcium: 10 mg/dL (ref 8.9–10.3)
Chloride: 97 mmol/L — ABNORMAL LOW (ref 98–111)
Creatinine, Ser: 1.03 mg/dL — ABNORMAL HIGH (ref 0.44–1.00)
GFR, Estimated: 52 mL/min — ABNORMAL LOW (ref >60)
Glucose, Bld: 131 mg/dL — ABNORMAL HIGH (ref 70–99)
Potassium: 4.1 mmol/L (ref 3.5–5.1)
Sodium: 137 mmol/L (ref 135–145)
Total Bilirubin: 0.5 mg/dL (ref 0.3–1.2)
Total Protein: 7.3 g/dL (ref 6.5–8.1)

## 2020-08-18 LAB — PROTIME-INR
INR: 1 (ref 0.8–1.2)
Prothrombin Time: 13.1 seconds (ref 11.4–15.2)

## 2020-08-18 LAB — LACTIC ACID, PLASMA
Lactic Acid, Venous: 1.2 mmol/L (ref 0.5–1.9)
Lactic Acid, Venous: 1.5 mmol/L (ref 0.5–1.9)

## 2020-08-18 LAB — SARS CORONAVIRUS 2 (TAT 6-24 HRS): SARS Coronavirus 2: NEGATIVE

## 2020-08-18 LAB — APTT: aPTT: 29 seconds (ref 24–36)

## 2020-08-18 MED ORDER — CALCIUM CARBONATE ANTACID 500 MG PO CHEW: 1000.0000 mg | CHEWABLE_TABLET | ORAL | Status: DC | PRN

## 2020-08-18 MED ORDER — POLYETHYLENE GLYCOL 3350 17 G PO PACK
17.0000 g | PACK | Freq: Every day | ORAL | Status: DC | PRN
  Administered 2020-08-20: 17 g via ORAL
  Filled 2020-08-18: qty 1

## 2020-08-18 MED ORDER — CALCIUM CARBONATE-VITAMIN D 500-200 MG-UNIT PO TABS
1.0000 | ORAL_TABLET | Freq: Two times a day (BID) | ORAL | Status: DC
  Administered 2020-08-18 – 2020-08-23 (×10): 1 via ORAL
  Filled 2020-08-18 (×10): qty 1

## 2020-08-18 MED ORDER — CLONAZEPAM 0.5 MG PO TABS: 0.2500 mg | ORAL_TABLET | ORAL | Status: DC

## 2020-08-18 MED ORDER — IOHEXOL 350 MG/ML SOLN: 100.0000 mL | Freq: Once | INTRAVENOUS | Status: DC | PRN

## 2020-08-18 MED ORDER — ENOXAPARIN SODIUM 30 MG/0.3ML IJ SOSY
30.0000 mg | PREFILLED_SYRINGE | INTRAMUSCULAR | Status: DC
  Administered 2020-08-18: 30 mg via SUBCUTANEOUS
  Filled 2020-08-18: qty 0.3

## 2020-08-18 MED ORDER — SALINE SPRAY 0.65 % NA SOLN
1.0000 | Freq: Every day | NASAL | Status: DC
  Administered 2020-08-18 – 2020-08-23 (×6): 1 via NASAL
  Filled 2020-08-18: qty 44

## 2020-08-18 MED ORDER — ONDANSETRON HCL 4 MG/2ML IJ SOLN
4.0000 mg | Freq: Four times a day (QID) | INTRAMUSCULAR | Status: DC | PRN
  Filled 2020-08-18: qty 2

## 2020-08-18 MED ORDER — ACETAMINOPHEN 325 MG PO TABS: 650.0000 mg | ORAL_TABLET | ORAL | Status: DC | PRN

## 2020-08-18 MED ORDER — ACETAMINOPHEN 325 MG PO TABS
650.0000 mg | ORAL_TABLET | Freq: Four times a day (QID) | ORAL | Status: DC | PRN
  Administered 2020-08-18 – 2020-08-22 (×3): 650 mg via ORAL
  Filled 2020-08-18 (×3): qty 2

## 2020-08-18 MED ORDER — MIRTAZAPINE 15 MG PO TABS
15.0000 mg | ORAL_TABLET | Freq: Every day | ORAL | Status: DC
  Administered 2020-08-18 – 2020-08-22 (×5): 15 mg via ORAL
  Filled 2020-08-18 (×5): qty 1

## 2020-08-18 MED ORDER — DOCUSATE SODIUM 100 MG PO CAPS
100.0000 mg | ORAL_CAPSULE | Freq: Two times a day (BID) | ORAL | Status: DC
  Administered 2020-08-18 – 2020-08-23 (×7): 100 mg via ORAL
  Filled 2020-08-18 (×10): qty 1

## 2020-08-18 MED ORDER — ALBUTEROL SULFATE (2.5 MG/3ML) 0.083% IN NEBU
3.0000 mL | INHALATION_SOLUTION | Freq: Four times a day (QID) | RESPIRATORY_TRACT | Status: DC | PRN
  Administered 2020-08-20 – 2020-08-21 (×2): 3 mL via RESPIRATORY_TRACT
  Filled 2020-08-18 (×2): qty 3

## 2020-08-18 MED ORDER — SALINE NASAL SPRAY 0.65 % NA SOLN: 1.0000 | Freq: Every day | NASAL | Status: DC

## 2020-08-18 MED ORDER — ADULT MULTIVITAMIN W/MINERALS CH
1.0000 | ORAL_TABLET | Freq: Every day | ORAL | Status: DC
  Administered 2020-08-18 – 2020-08-23 (×6): 1 via ORAL
  Filled 2020-08-18 (×6): qty 1

## 2020-08-18 MED ORDER — SODIUM CHLORIDE 0.9 % IV SOLN
Freq: Once | INTRAVENOUS | Status: AC
Start: 2020-08-18 — End: 2020-08-19

## 2020-08-18 MED ORDER — LOPERAMIDE HCL 2 MG PO CAPS: 2.0000 mg | ORAL_CAPSULE | Freq: Every day | ORAL | Status: DC | PRN

## 2020-08-18 MED ORDER — VANCOMYCIN HCL 750 MG/150ML IV SOLN
750.0000 mg | INTRAVENOUS | Status: DC
  Administered 2020-08-19 (×2): 750 mg via INTRAVENOUS
  Filled 2020-08-18 (×2): qty 150

## 2020-08-18 MED ORDER — BUPROPION HCL ER (SR) 150 MG PO TB12
150.0000 mg | ORAL_TABLET | Freq: Two times a day (BID) | ORAL | Status: DC
  Administered 2020-08-18 – 2020-08-23 (×10): 150 mg via ORAL
  Filled 2020-08-18 (×10): qty 1

## 2020-08-18 MED ORDER — VANCOMYCIN HCL IN DEXTROSE 1-5 GM/200ML-% IV SOLN
1000.0000 mg | Freq: Once | INTRAVENOUS | Status: AC
  Administered 2020-08-18: 1000 mg via INTRAVENOUS
  Filled 2020-08-18: qty 200

## 2020-08-18 MED ORDER — ONDANSETRON HCL 4 MG/2ML IJ SOLN
4.0000 mg | Freq: Once | INTRAMUSCULAR | Status: DC
  Filled 2020-08-18: qty 2

## 2020-08-18 MED ORDER — NITROFURANTOIN MONOHYD MACRO 100 MG PO CAPS
100.0000 mg | ORAL_CAPSULE | Freq: Two times a day (BID) | ORAL | Status: DC
  Administered 2020-08-18 – 2020-08-20 (×4): 100 mg via ORAL
  Filled 2020-08-18 (×4): qty 1

## 2020-08-18 MED ORDER — LISINOPRIL 20 MG PO TABS
20.0000 mg | ORAL_TABLET | Freq: Every day | ORAL | Status: DC
  Administered 2020-08-19 – 2020-08-23 (×5): 20 mg via ORAL
  Filled 2020-08-18 (×5): qty 1

## 2020-08-18 MED ORDER — PANTOPRAZOLE SODIUM 40 MG PO TBEC
40.0000 mg | DELAYED_RELEASE_TABLET | Freq: Every day | ORAL | Status: DC
  Administered 2020-08-18 – 2020-08-23 (×6): 40 mg via ORAL
  Filled 2020-08-18 (×6): qty 1

## 2020-08-18 MED ORDER — SERTRALINE HCL 100 MG PO TABS
100.0000 mg | ORAL_TABLET | Freq: Every day | ORAL | Status: DC
  Administered 2020-08-18 – 2020-08-23 (×6): 100 mg via ORAL
  Filled 2020-08-18 (×6): qty 1

## 2020-08-18 MED ORDER — CLONAZEPAM 0.125 MG PO TBDP
0.2500 mg | ORAL_TABLET | Freq: Three times a day (TID) | ORAL | Status: DC
  Administered 2020-08-18 – 2020-08-23 (×15): 0.25 mg via ORAL
  Filled 2020-08-18 (×15): qty 2

## 2020-08-18 MED ORDER — HYDROCODONE-ACETAMINOPHEN 5-325 MG PO TABS: 1.0000 | ORAL_TABLET | ORAL | Status: DC | PRN

## 2020-08-18 MED ORDER — LORATADINE 10 MG PO TABS
10.0000 mg | ORAL_TABLET | Freq: Every day | ORAL | Status: DC
  Administered 2020-08-18 – 2020-08-23 (×4): 10 mg via ORAL
  Filled 2020-08-18 (×5): qty 1

## 2020-08-18 MED ORDER — ESTROGENS, CONJUGATED 0.625 MG/GM VA CREA
1.0000 | TOPICAL_CREAM | VAGINAL | Status: DC
  Administered 2020-08-18: 1 via VAGINAL
  Filled 2020-08-18: qty 30

## 2020-08-18 MED ORDER — FLUTICASONE PROPIONATE 50 MCG/ACT NA SUSP
1.0000 | Freq: Every day | NASAL | Status: DC
  Administered 2020-08-18 – 2020-08-22 (×5): 1 via NASAL
  Filled 2020-08-18: qty 16

## 2020-08-18 MED ORDER — CLONAZEPAM 0.125 MG PO TBDP: 0.2500 mg | ORAL_TABLET | Freq: Two times a day (BID) | ORAL | Status: DC | PRN

## 2020-08-18 MED ORDER — ATORVASTATIN CALCIUM 10 MG PO TABS
10.0000 mg | ORAL_TABLET | Freq: Every day | ORAL | Status: DC
  Administered 2020-08-18 – 2020-08-22 (×5): 10 mg via ORAL
  Filled 2020-08-18 (×5): qty 1

## 2020-08-18 MED ORDER — ONDANSETRON HCL 4 MG PO TABS: 4.0000 mg | ORAL_TABLET | Freq: Four times a day (QID) | ORAL | Status: DC | PRN

## 2020-08-18 MED ORDER — ZOLPIDEM TARTRATE 5 MG PO TABS: 5.0000 mg | ORAL_TABLET | Freq: Every evening | ORAL | Status: DC | PRN

## 2020-08-18 MED ORDER — ONDANSETRON HCL 4 MG PO TABS: 8.0000 mg | ORAL_TABLET | Freq: Three times a day (TID) | ORAL | Status: DC | PRN

## 2020-08-18 MED ORDER — GABAPENTIN 300 MG PO CAPS
300.0000 mg | ORAL_CAPSULE | Freq: Every day | ORAL | Status: DC
  Administered 2020-08-18 – 2020-08-22 (×5): 300 mg via ORAL
  Filled 2020-08-18 (×5): qty 1

## 2020-08-18 MED ORDER — CALCIUM CARB-CHOLECALCIFEROL 600-800 MG-UNIT PO TABS: 1.0000 | ORAL_TABLET | Freq: Two times a day (BID) | ORAL | Status: DC

## 2020-08-18 MED ORDER — ACETAMINOPHEN 650 MG RE SUPP: 650.0000 mg | Freq: Four times a day (QID) | RECTAL | Status: DC | PRN

## 2020-08-18 NOTE — ED Provider Notes (Addendum)
[facility]-EMERGENCY DEPT Provider Note   CSN: [id number] Arrival date & time: 08/18/20  0000     History No chief complaint on file.   Samantha Crane is a 87 y.o. female.  The history is provided by the patient.  She has history of hypertension, hyperlipidemia, chronic kidney disease, transient ischemic attack and was status post anterior and posterior repair on 7/22 and comes in because of inability to urinate, generalized weakness, chills.  She had been seen in her gynecologist office yesterday and was told that they were concerned that she was not emptying her bladder completely and started her on nitrofurantoin.  She did have some transient nausea tonight.  She was not aware of any fever and has not had any sweats.  There is no cough, constipation, diarrhea.  She denies dysuria but states that she has not been able to urinate well today.   Past Medical History:  Diagnosis Date   Anxiety    Chronic kidney disease    pt denies, states chronic bladder infections   Depression    Hyperlipidemia    Hypertension    IBS (irritable bowel syndrome)    Orthostatic tremor    Vitamin D deficiency     Patient Active Problem List   Diagnosis Date Noted   Hyponatremia 12/18/2016   Incomplete emptying of bladder 11/15/2016   Prolapse of female genital organs 10/21/2016   Recurrent UTI 10/21/2016   Vaginal atrophy 10/21/2016   Right sciatic nerve pain 07/08/2016   Chronic nonseasonal allergic rhinitis due to pollen 02/09/2016   Environmental allergies 02/09/2016   Allergic rhinitis 11/29/2015   Cough 11/29/2015   History of food allergy 11/29/2015   Primary insomnia 06/05/2015   Subacute maxillary sinusitis 01/06/2015   Chronic diarrhea 06/20/2014   Chronic kidney disease (CKD) stage G3a/A1, moderately decreased glomerular filtration rate (GFR) between 45-59 mL/min/1.73 square meter and albuminuria creatinine ratio less than 30 mg/g (HCC) 11/30/2013   Alopecia  03/17/2013   Dermatitis 12/04/2012   B-complex deficiency 11/19/2012   Essential tremor 11/19/2012   Duodenal ulcer with hemorrhage 05/20/2012   Orthostatic tremor 02/19/2012   Carotid stenosis, bilateral 09/17/2011   Stenosis of carotid artery 09/17/2011   Hyperlipidemia 04/16/2011   Personal history of transient ischemic attack (TIA), and cerebral infarction without residual deficits 03/04/2011   Vitamin D deficiency 09/20/2010   Anxiety 08/08/2010   Essential hypertension 08/08/2010   Irritable bowel syndrome without diarrhea 08/08/2010   Major depressive disorder, single episode 08/08/2010   Spinal stenosis 08/08/2007    Past Surgical History:  Procedure Laterality Date   lumbar spinal stenosis       OB History   No obstetric history on file.     Family History  Problem Relation Age of Onset   Heart attack Father    Stroke Mother    Stroke Brother    Stroke Paternal Aunt    Allergic rhinitis Neg Hx    Angioedema Neg Hx    Asthma Neg Hx    Immunodeficiency Neg Hx    Eczema Neg Hx    Urticaria Neg Hx     Social History   Tobacco Use   Smoking status: Former    Packs/day: 0.25    Types: Cigarettes   Smokeless tobacco: Never   Tobacco comments:    quit 30 years ago; social smoker  Vaping Use   Vaping Use: Never used  Substance Use Topics   Alcohol use: No    Alcohol/week: 0.0  standard drinks    Comment: rarely   Drug use: No    Home Medications Prior to Admission medications   Medication Sig Start Date End Date Taking? Authorizing Provider  acetaminophen (TYLENOL) 325 MG tablet Take 650 mg by mouth every 4 (four) hours as needed.    [provider]  albuterol (PROVENTIL HFA;VENTOLIN HFA) 108 (90 Base) MCG/ACT inhaler Inhale 2 puffs into the lungs every 6 (six) hours as needed. 11/29/15   [provider]  atorvastatin (LIPITOR) 10 MG tablet Take 10 mg by mouth daily.    [provider]  benzonatate (TESSALON) 100 MG capsule  Take 100 mg by mouth every 8 (eight) hours as needed for cough.     [provider]  buPROPion (WELLBUTRIN SR) 150 MG 12 hr tablet Take 150 mg by mouth 2 (two) times daily. 02/07/19   [provider]  Calcium Carb-Cholecalciferol (CALCIUM-VITAMIN D) 500-200 MG-UNIT tablet Take 1 tablet by mouth 2 (two) times daily.  04/27/12   [provider]  cephALEXin (KEFLEX) 250 MG capsule Take 250 mg by mouth daily.  06/27/17   [provider]  cetirizine (ZYRTEC) 5 MG tablet Take 5 mg by mouth daily.     [provider]  clonazePAM (KLONOPIN) 0.5 MG tablet Take 0.25 mg by mouth 3 (three) times daily. 0800 1400 2000 01/02/17   [provider]  ferrous sulfate 325 (65 FE) MG tablet Take 325 mg by mouth daily with breakfast.    [provider]  fluticasone (FLONASE) 50 MCG/ACT nasal spray Place 2 sprays into both nostrils daily as needed for allergies or rhinitis. Patient taking differently: Place 2 sprays into both nostrils at bedtime.  08/13/17   Bobbitt, Heywood Iles, MD  gabapentin (NEURONTIN) 100 MG capsule Take 100 mg by mouth daily. @@1400     [provider]  gabapentin (NEURONTIN) 300 MG capsule Take 300 mg by mouth 2 (two) times daily.     [provider]  guaiFENesin (MUCINEX) 600 MG 12 hr tablet Take 600 mg by mouth 2 (two) times daily as needed for cough.    [provider]  lisinopril (PRINIVIL,ZESTRIL) 20 MG tablet Take 20 mg by mouth daily.    [provider]  mirtazapine (REMERON) 15 MG tablet Take 15 mg by mouth at bedtime.     [provider]  Multiple Vitamin (MULTI-VITAMINS) TABS Take 1 tablet by mouth daily.     [provider]  pantoprazole (PROTONIX) 40 MG tablet Take 40 mg by mouth daily.    [provider]  polyethylene glycol (MIRALAX / GLYCOLAX) 17 g packet Take 17 g by mouth daily as needed for moderate constipation.    [provider]  sennosides-docusate  sodium (SENOKOT-S) 8.6-50 MG tablet Take 1 tablet by mouth daily.    [provider]  sertraline (ZOLOFT) 50 MG tablet Take 100 mg by mouth daily.     [provider]  valACYclovir (VALTREX) 1000 MG tablet Take 1 g by mouth every 8 (eight) hours as needed (outbreak).  06/21/16   [provider]  zaleplon (SONATA) 10 MG capsule Take 10 mg by mouth at bedtime as needed.  12/05/16   [provider]    Allergies    Prednisone, Amlodipine, Amoxicillin, Amoxicillin-pot clavulanate, Limonene, and Sulfa antibiotics  Review of Systems   Review of Systems  All other systems reviewed and are negative.  Physical Exam Updated Vital Signs BP (!) 140/98   Pulse 88  Temp 99.8 F (37.7 C) (Oral)   SpO2 92%   Physical Exam Vitals and nursing note reviewed.  87 year old female, resting comfortably and in no acute distress. Vital signs are significant for elevated blood pressure. Oxygen saturation is 92%, which is normal. Head is normocephalic and atraumatic. PERRLA, EOMI. Oropharynx is clear. Neck is nontender and supple without adenopathy or JVD. Back is nontender and there is no CVA tenderness. Lungs are clear without rales, wheezes, or rhonchi. Chest is nontender. Heart has regular rate and rhythm without murmur. Abdomen is soft, flat, with slightly distended bladder which is somewhat tender.  There is no rebound or guarding.  There are no other masses or hepatosplenomegaly and peristalsis is hypoactive. Extremities have no cyanosis or edema, full range of motion is present.  There is a scab on the anterior aspect of the left lower leg.  The skin surrounding that has mild erythema and warmth. Skin is warm and dry without other rash. Neurologic: Mental status is normal, cranial nerves are intact, there are no motor or sensory deficits.  ED Results / Procedures / Treatments   Labs (all labs ordered are listed, but only abnormal results are displayed) Labs  Reviewed  COMPREHENSIVE METABOLIC PANEL - Abnormal; Notable for the following components:      Result Value   Chloride 97 (*)    Glucose, Bld 131 (*)    BUN 27 (*)    Creatinine, Ser 1.03 (*)    GFR, Estimated 52 (*)    All other components within normal limits  CBC WITH DIFFERENTIAL/PLATELET - Abnormal; Notable for the following components:   WBC 21.8 (*)    Neutro Abs 20.4 (*)    Lymphs Abs 0.4 (*)    Abs Immature Granulocytes 0.22 (*)    All other components within normal limits  URINALYSIS, ROUTINE W REFLEX MICROSCOPIC - Abnormal; Notable for the following components:   Leukocytes,Ua SMALL (*)    Bacteria, UA RARE (*)    All other components within normal limits  URINE CULTURE  CULTURE, BLOOD (ROUTINE X 2)  CULTURE, BLOOD (ROUTINE X 2) W REFLEX TO ID PANEL  SARS CORONAVIRUS 2 (TAT 6-24 HRS)  LACTIC ACID, PLASMA  PROTIME-INR  APTT  LACTIC ACID, PLASMA    EKG EKG Interpretation  Date/Time:  Friday August 18 2020 01:16:08 EDT Ventricular Rate:  84 PR Interval:  160 QRS Duration: 78 QT Interval:  357 QTC Calculation: 422 R Axis:   75 Text Interpretation: Sinus rhythm Normal ECG When compared with ECG of 09/19/2016, No significant change was found Confirmed by Dione BoozeGlick, [place] ([id number]) on 08/18/2020 1:26:35 AM  Radiology DG Chest Port 1 View  Result Date: 08/18/2020 CLINICAL DATA:  Questionable sepsis. EXAM: PORTABLE CHEST 1 VIEW COMPARISON:  Chest radiograph dated 03/14/2018. FINDINGS: Bibasilar streaky densities may represent atelectasis. Developing infiltrate is not excluded clinical correlation is recommended. No lobar consolidation, pleural effusion or pneumothorax. The cardiac silhouette is within normal limits. Atherosclerotic calcification of the aorta. No acute osseous pathology. IMPRESSION: Bibasilar streaky atelectasis versus less likely developing infiltrate. Electronically Signed   By: Elgie CollardArash  Radparvar M.D.   On: 08/18/2020 01:04    Procedures Procedures  CRITICAL  CARE Performed by: Dione Boozeavid Brok Stocking Total critical care time: 50 minutes Critical care time was exclusive of separately billable procedures and treating other patients. Critical care was necessary to treat or prevent imminent or life-threatening deterioration. Critical care was time spent personally by me on the following activities: development of treatment plan  with patient and/or surrogate as well as nursing, discussions with consultants, evaluation of patient's response to treatment, examination of patient, obtaining history from patient or surrogate, ordering and performing treatments and interventions, ordering and review of laboratory studies, ordering and review of radiographic studies, pulse oximetry and re-evaluation of patient's condition.  Medications Ordered in ED Medications  ondansetron (ZOFRAN) injection 4 mg (4 mg Intravenous Not Given 08/18/20 0119)  iohexol (OMNIPAQUE) 350 MG/ML injection 100 mL (has no administration in time range)  vancomycin (VANCOCIN) IVPB 1000 mg/200 mL premix (0 mg Intravenous Stopped 08/18/20 0405)    ED Course  I have reviewed the triage vital signs and the nursing notes.  Pertinent labs & imaging results that were available during my care of the patient were reviewed by me and considered in my medical decision making (see chart for details).   MDM Rules/Calculators/A&P                         Weakness, chills concerning for possible sepsis although she is afebrile here and with normal heart rate.  She started on the evolving sepsis pathway.  Probable urinary retention.  Post void bladder scan showed greater than 221 mL urine.  Old records reviewed confirming recent anterior and posterior repair, office visit yesterday with changing antibiotics from cephalexin to nitrofurantoin.  Cellulitis of the left lower leg.  This has failed cephalexin as she had been on cephalexin until yesterday.  ECG is unchanged from baseline.  Chest x-ray shows atelectasis versus  developing infiltrate .  Urinalysis is not impressive.  There were 11-20 WBCs and rare bacteria.  WBC is significantly elevated at 21.8 with 94% neutrophils.  Cellulitis of her leg is the likely source.  However, with abdominal pain, will send for CT of abdomen and pelvis.  Patient is refusing CT of abdomen and pelvis.  Lactic acid level is normal.  Given patient's marked leukocytosis and failure to respond to outpatient antibiotics, decision is made to admit her.  Case is discussed with Dr. Toniann Fail of [facility], who agrees to admit the patient.  Final Clinical Impression(s) / ED Diagnoses Final diagnoses:  Cellulitis of left anterior lower leg    Rx / DC Orders ED Discharge Orders     None        Dione Booze, MD 08/18/20 [id number]    Dione Booze, MD 08/18/20 [phone]

## 2020-08-18 NOTE — ED Notes (Signed)
Daughter at bedside.

## 2020-08-18 NOTE — ED Notes (Signed)
Patient placed on purewick and dry pad. Family remains at bedside.

## 2020-08-18 NOTE — Progress Notes (Signed)
Pharmacy Antibiotic Note  Samantha Crane is a 87 y.o. female admitted on 08/18/2020 with cellulitis of LLE.  Pharmacy has been consulted for vancomycin dosing. Was on Kelfex 250 mg daily long term- prophylactic dose, not treatment dose. 8/3 Rx for nitrofurantion 100 mg BID x 5 days ordered for UTI- started on 8/4 8/3 UA nitrite +, mod leukocytes 8/5 UA small leukocytes, rare bacteria (on macrobid) Pt c/o urinary retention x 2 weeks, weakness & chills.   Vanc ordered for LLE cellulitis.  CXR: atelectasis vs developing infiltrate. UA w/ rare bacteria. Pt refused CT of abd/pelvis.  Hx carbapenem-resistant enterobacteriaceae Plan: Vancomycin 1 gm IV x 1 given in ED @ 03 am Vancomycin 750 mg IV q24 for est AUC 538 using SCr 1.04 and Vd 0.72 F/u renal function, WBC, temp, culture data Rec de-escalate to ceftriaxone or cefazolin as appropriate    Temp (24hrs), Avg:99.8 F (37.7 C), Min:99.8 F (37.7 C), Max:99.8 F (37.7 C)  Recent Labs  Lab 08/18/20 0107 08/18/20 0338  WBC 21.8*  --   CREATININE 1.03*  --   LATICACIDVEN 1.2 1.5    CrCl cannot be calculated (Unknown ideal weight.).    Allergies  Allergen Reactions   Prednisone     Other reaction(s): Other (See Comments) Manic tendencies   Amlodipine Swelling   Amoxicillin    Amoxicillin-Pot Clavulanate Diarrhea   Limonene Diarrhea   Sulfa Antibiotics    Antimicrobials this admission:  8/5 vanc>>  Dose adjustments this admission:   Microbiology results:  8/5 UCx: ip 8/5 BCx- only able to get one set:   Thank you for allowing pharmacy to be a part of this patient's care.  Herby Abraham, Pharm.D 08/18/2020 10:42 AM

## 2020-08-18 NOTE — Plan of Care (Signed)

## 2020-08-18 NOTE — H&P (Signed)
History and Physical    Kasidee Voisin SNK:[id number] DOB: 11-Dec-1929 DOA: 08/18/2020  PCP: Nadara Eaton, MD  Patient coming from: Assisted living at [facility]  Chief Complaint: chills, LLE cellulitis  HPI: Samantha Crane is a 87 y.o. female with medical history significant of HTN, HLD, IBS. Presenting with LLE cellulitis. She reports about a month ago she had an accident with her walker. She tripped into it and cut her leg. There was an open wound for a few days. Staff at her facility helped her dress it and placed topical ointments. However, her leg became progressively more red. She did not see her PCP about it. She noted that she was having shaking and chills yesterday. She denies any drainage from the spot at any time. She denies F/N/V. She became concerned with the chills, so she came to the ED for help.    ED Course: Noted to have LLE cellulitis and wound. Elevated WBC. Started on vanc. [facility] was called for admission.   Review of Systems: Review of systems is otherwise negative for all not mentioned in HPI.   PMHx Past Medical History:  Diagnosis Date   Anxiety    Chronic kidney disease    pt denies, states chronic bladder infections   Depression    Hyperlipidemia    Hypertension    IBS (irritable bowel syndrome)    Orthostatic tremor    Vitamin D deficiency     PSHx Past Surgical History:  Procedure Laterality Date   lumbar spinal stenosis      SocHx  reports that she has quit smoking. Her smoking use included cigarettes. She smoked an average of .25 packs per day. She has never used smokeless tobacco. She reports that she does not drink alcohol and does not use drugs.  Allergies  Allergen Reactions   Prednisone     Other reaction(s): Other (See Comments) Manic tendencies   Amlodipine Swelling   Amoxicillin    Amoxicillin-Pot Clavulanate Diarrhea   Limonene Diarrhea   Sulfa Antibiotics     FamHx Family History  Problem Relation Age of Onset   Heart attack  Father    Stroke Mother    Stroke Brother    Stroke Paternal Aunt    Allergic rhinitis Neg Hx    Angioedema Neg Hx    Asthma Neg Hx    Immunodeficiency Neg Hx    Eczema Neg Hx    Urticaria Neg Hx     Prior to Admission medications   Medication Sig Start Date End Date Taking? Authorizing Provider  acetaminophen (TYLENOL) 325 MG tablet Take 650 mg by mouth every 4 (four) hours as needed.    [provider]  albuterol (PROVENTIL HFA;VENTOLIN HFA) 108 (90 Base) MCG/ACT inhaler Inhale 2 puffs into the lungs every 6 (six) hours as needed. 11/29/15   [provider]  atorvastatin (LIPITOR) 10 MG tablet Take 10 mg by mouth daily.    [provider]  benzonatate (TESSALON) 100 MG capsule Take 100 mg by mouth every 8 (eight) hours as needed for cough.     [provider]  buPROPion (WELLBUTRIN SR) 150 MG 12 hr tablet Take 150 mg by mouth 2 (two) times daily. 02/07/19   [provider]  Calcium Carb-Cholecalciferol (CALCIUM-VITAMIN D) 500-200 MG-UNIT tablet Take 1 tablet by mouth 2 (two) times daily.  04/27/12   [provider]  cephALEXin (KEFLEX) 250 MG capsule Take 250 mg by mouth daily.  06/27/17   [provider]  cetirizine (ZYRTEC) 5 MG tablet Take 5 mg by mouth daily.     [provider]  clonazePAM (KLONOPIN) 0.5 MG tablet Take 0.25 mg by mouth 3 (three) times daily. 0800 1400 2000 01/02/17   [provider]  ferrous sulfate 325 (65 FE) MG tablet Take 325 mg by mouth daily with breakfast.    [provider]  fluticasone (FLONASE) 50 MCG/ACT nasal spray Place 2 sprays into both nostrils daily as needed for allergies or rhinitis. Patient taking differently: Place 2 sprays into both nostrils at bedtime.  08/13/17   Bobbitt, Heywood Iles, MD  gabapentin (NEURONTIN) 100 MG capsule Take 100 mg by mouth daily. @@1400     [provider]  gabapentin (NEURONTIN) 300 MG capsule Take 300 mg by mouth 2 (two)  times daily.     [provider]  guaiFENesin (MUCINEX) 600 MG 12 hr tablet Take 600 mg by mouth 2 (two) times daily as needed for cough.    [provider]  lisinopril (PRINIVIL,ZESTRIL) 20 MG tablet Take 20 mg by mouth daily.    [provider]  mirtazapine (REMERON) 15 MG tablet Take 15 mg by mouth at bedtime.     [provider]  Multiple Vitamin (MULTI-VITAMINS) TABS Take 1 tablet by mouth daily.     [provider]  pantoprazole (PROTONIX) 40 MG tablet Take 40 mg by mouth daily.    [provider]  polyethylene glycol (MIRALAX / GLYCOLAX) 17 g packet Take 17 g by mouth daily as needed for moderate constipation.    [provider]  sennosides-docusate sodium (SENOKOT-S) 8.6-50 MG tablet Take 1 tablet by mouth daily.    [provider]  sertraline (ZOLOFT) 50 MG tablet Take 100 mg by mouth daily.     [provider]  valACYclovir (VALTREX) 1000 MG tablet Take 1 g by mouth every 8 (eight) hours as needed (outbreak).  06/21/16   [provider]  zaleplon (SONATA) 10 MG capsule Take 10 mg by mouth at bedtime as needed.  12/05/16   [provider]    Physical Exam: Vitals:   08/18/20 0515 08/18/20 0530 08/18/20 0545 08/18/20 0600  BP: (!) 113/54 134/63 (!) 114/91 128/64  Pulse: 81 81 81 81  Resp: 20 18 (!) 21 (!) 21  Temp:      TempSrc:      SpO2: 100% 99% 100% 100%    General: 87 y.o. female resting in bed in NAD Eyes: PERRL, normal sclera ENMT: Nares patent w/o discharge, orophaynx clear, dentition normal, ears w/o discharge/lesions/ulcers Neck: Supple, trachea midline Cardiovascular: RRR, +S1, S2, no m/g/r, equal pulses throughout Respiratory: CTABL, no w/r/r, normal WOB GI: BS+, NDNT, no masses noted, no organomegaly noted MSK: No e/c/c; LLE cellulitis and wound non-purulent Skin: No rashes, ulcerations noted Neuro: A&O x 3, no focal deficits Psyc: Appropriate interaction and affect,  calm/cooperative  Labs on Admission: I have personally reviewed following labs and imaging studies  CBC: Recent Labs  Lab 08/18/20 0107  WBC 21.8*  NEUTROABS 20.4*  HGB 13.1  HCT 39.5  MCV 94.5  PLT 227   Basic Metabolic Panel: Recent Labs  Lab 08/18/20 0107  NA 137  K 4.1  CL 97*  CO2 30  GLUCOSE 131*  BUN 27*  CREATININE 1.03*  CALCIUM 10.0   GFR: CrCl cannot be calculated (Unknown ideal weight.). Liver Function Tests: Recent Labs  Lab 08/18/20 0107  AST 26  ALT 19  ALKPHOS 87  BILITOT  0.5  PROT 7.3  ALBUMIN 4.5   No results for input(s): LIPASE, AMYLASE in the last 168 hours. No results for input(s): AMMONIA in the last 168 hours. Coagulation Profile: Recent Labs  Lab 08/18/20 0107  INR 1.0   Cardiac Enzymes: No results for input(s): CKTOTAL, CKMB, CKMBINDEX, TROPONINI in the last 168 hours. BNP (last 3 results) No results for input(s): PROBNP in the last 8760 hours. HbA1C: No results for input(s): HGBA1C in the last 72 hours. CBG: No results for input(s): GLUCAP in the last 168 hours. Lipid Profile: No results for input(s): CHOL, HDL, LDLCALC, TRIG, CHOLHDL, LDLDIRECT in the last 72 hours. Thyroid Function Tests: No results for input(s): TSH, T4TOTAL, FREET4, T3FREE, THYROIDAB in the last 72 hours. Anemia Panel: No results for input(s): VITAMINB12, FOLATE, FERRITIN, TIBC, IRON, RETICCTPCT in the last 72 hours. Urine analysis:    Component Value Date/Time   COLORURINE YELLOW 08/18/2020 0033   APPEARANCEUR CLEAR 08/18/2020 0033   LABSPEC 1.010 08/18/2020 0033   PHURINE 7.0 08/18/2020 0033   GLUCOSEU NEGATIVE 08/18/2020 0033   HGBUR NEGATIVE 08/18/2020 0033   BILIRUBINUR NEGATIVE 08/18/2020 0033   KETONESUR NEGATIVE 08/18/2020 0033   PROTEINUR NEGATIVE 08/18/2020 0033   NITRITE NEGATIVE 08/18/2020 0033   LEUKOCYTESUR SMALL (A) 08/18/2020 0033    Radiological Exams on Admission: DG Chest Port 1 View  Result Date: 08/18/2020 CLINICAL  DATA:  Questionable sepsis. EXAM: PORTABLE CHEST 1 VIEW COMPARISON:  Chest radiograph dated 03/14/2018. FINDINGS: Bibasilar streaky densities may represent atelectasis. Developing infiltrate is not excluded clinical correlation is recommended. No lobar consolidation, pleural effusion or pneumothorax. The cardiac silhouette is within normal limits. Atherosclerotic calcification of the aorta. No acute osseous pathology. IMPRESSION: Bibasilar streaky atelectasis versus less likely developing infiltrate. Electronically Signed   By: Elgie Collard M.D.   On: 08/18/2020 01:04    EKG: Independently reviewed. Sinus, no st elevations  Assessment/Plan LLE cellulitis     - admitted to med-surg, obs     - continue vanc; gentle fluids for now     - pain control     - follow up blood cultures  Recent UTI     - has MDR e coli in past, recently started on macrobid     - continue macrobid for now     - Ucx sent  Recent urinary retention and bladder tack     - continue outpt follow     - watch I&O  HTN     - resume home regimen when confirmed  HLD     - resume home regimen when confirmed  Anxiety Depression     - resume home regimen when confirmed  Hx of IBS     - controlled, follow  Chronic pain Hx of T9 compression Fx     - continue chronic pain meds as needed   DVT prophylaxis: lovenox Code Status: DNR  Family Communication: w/ dtr at bedside  Consults called: None   Status is: Observation  The patient remains OBS appropriate and will d/c before 2 midnights.  Dispo: The patient is from: ALF              Anticipated d/c is to: ALF              Patient currently is not medically stable to d/c.   Difficult to place patient No  Time spent coordinating admission: 70 minutes  Kaeden Mester A Haroun Cotham DO [facility]  If 7PM-7AM, please contact night-coverage [email or web address]  08/18/2020,  8:20 AM

## 2020-08-18 NOTE — ED Notes (Signed)
Updated pt daughter Larita Fife on plan of care.

## 2020-08-18 NOTE — ED Triage Notes (Signed)
[facility] from [place]. Pt complaining of urinary retention X2 weeks. Pt has been treated for UTI. Pt complaining of abdominal pain and tremor, states she feels she is not peeing normally.

## 2020-08-18 NOTE — ED Notes (Signed)
Unable to obtain second set of Blood Cultures.

## 2020-08-18 NOTE — ED Notes (Signed)
Bladder scan of pt showed >226mL retained

## 2020-08-19 DIAGNOSIS — Z66 Do not resuscitate: Secondary | ICD-10-CM | POA: Diagnosis present

## 2020-08-19 DIAGNOSIS — I5033 Acute on chronic diastolic (congestive) heart failure: Secondary | ICD-10-CM | POA: Diagnosis present

## 2020-08-19 DIAGNOSIS — I5031 Acute diastolic (congestive) heart failure: Secondary | ICD-10-CM | POA: Diagnosis not present

## 2020-08-19 DIAGNOSIS — Z882 Allergy status to sulfonamides status: Secondary | ICD-10-CM | POA: Diagnosis not present

## 2020-08-19 DIAGNOSIS — Z79899 Other long term (current) drug therapy: Secondary | ICD-10-CM | POA: Diagnosis not present

## 2020-08-19 DIAGNOSIS — F419 Anxiety disorder, unspecified: Secondary | ICD-10-CM | POA: Diagnosis present

## 2020-08-19 DIAGNOSIS — A419 Sepsis, unspecified organism: Secondary | ICD-10-CM | POA: Diagnosis present

## 2020-08-19 DIAGNOSIS — I10 Essential (primary) hypertension: Secondary | ICD-10-CM | POA: Diagnosis not present

## 2020-08-19 DIAGNOSIS — K589 Irritable bowel syndrome without diarrhea: Secondary | ICD-10-CM | POA: Diagnosis present

## 2020-08-19 DIAGNOSIS — I130 Hypertensive heart and chronic kidney disease with heart failure and stage 1 through stage 4 chronic kidney disease, or unspecified chronic kidney disease: Secondary | ICD-10-CM | POA: Diagnosis present

## 2020-08-19 DIAGNOSIS — Z881 Allergy status to other antibiotic agents status: Secondary | ICD-10-CM | POA: Diagnosis not present

## 2020-08-19 DIAGNOSIS — E739 Lactose intolerance, unspecified: Secondary | ICD-10-CM | POA: Diagnosis present

## 2020-08-19 DIAGNOSIS — Z20822 Contact with and (suspected) exposure to covid-19: Secondary | ICD-10-CM | POA: Diagnosis present

## 2020-08-19 DIAGNOSIS — R0902 Hypoxemia: Secondary | ICD-10-CM | POA: Diagnosis present

## 2020-08-19 DIAGNOSIS — Z8249 Family history of ischemic heart disease and other diseases of the circulatory system: Secondary | ICD-10-CM | POA: Diagnosis not present

## 2020-08-19 DIAGNOSIS — F32A Depression, unspecified: Secondary | ICD-10-CM | POA: Diagnosis present

## 2020-08-19 DIAGNOSIS — J9621 Acute and chronic respiratory failure with hypoxia: Secondary | ICD-10-CM | POA: Diagnosis not present

## 2020-08-19 DIAGNOSIS — E876 Hypokalemia: Secondary | ICD-10-CM | POA: Diagnosis present

## 2020-08-19 DIAGNOSIS — N1831 Chronic kidney disease, stage 3a: Secondary | ICD-10-CM | POA: Diagnosis present

## 2020-08-19 DIAGNOSIS — Z888 Allergy status to other drugs, medicaments and biological substances status: Secondary | ICD-10-CM | POA: Diagnosis not present

## 2020-08-19 DIAGNOSIS — L03116 Cellulitis of left lower limb: Secondary | ICD-10-CM | POA: Diagnosis present

## 2020-08-19 DIAGNOSIS — Z823 Family history of stroke: Secondary | ICD-10-CM | POA: Diagnosis not present

## 2020-08-19 DIAGNOSIS — E785 Hyperlipidemia, unspecified: Secondary | ICD-10-CM | POA: Diagnosis present

## 2020-08-19 DIAGNOSIS — Z87891 Personal history of nicotine dependence: Secondary | ICD-10-CM | POA: Diagnosis not present

## 2020-08-19 DIAGNOSIS — Z8673 Personal history of transient ischemic attack (TIA), and cerebral infarction without residual deficits: Secondary | ICD-10-CM | POA: Diagnosis not present

## 2020-08-19 DIAGNOSIS — Z880 Allergy status to penicillin: Secondary | ICD-10-CM | POA: Diagnosis not present

## 2020-08-19 DIAGNOSIS — N390 Urinary tract infection, site not specified: Secondary | ICD-10-CM | POA: Diagnosis present

## 2020-08-19 LAB — URINALYSIS, ROUTINE W REFLEX MICROSCOPIC
Bilirubin Urine: NEGATIVE
Glucose, UA: NEGATIVE mg/dL
Ketones, ur: NEGATIVE mg/dL
Nitrite: NEGATIVE
Protein, ur: NEGATIVE mg/dL
Specific Gravity, Urine: 1.006 (ref 1.005–1.030)
pH: 6 (ref 5.0–8.0)

## 2020-08-19 LAB — CBC
HCT: 37.2 % (ref 36.0–46.0)
Hemoglobin: 11.7 g/dL — ABNORMAL LOW (ref 12.0–15.0)
MCH: 30.8 pg (ref 26.0–34.0)
MCHC: 31.5 g/dL (ref 30.0–36.0)
MCV: 97.9 fL (ref 80.0–100.0)
Platelets: 169 10*3/uL (ref 150–400)
RBC: 3.8 MIL/uL — ABNORMAL LOW (ref 3.87–5.11)
RDW: 12.8 % (ref 11.5–15.5)
WBC: 16.3 10*3/uL — ABNORMAL HIGH (ref 4.0–10.5)
nRBC: 0 % (ref 0.0–0.2)

## 2020-08-19 LAB — COMPREHENSIVE METABOLIC PANEL
ALT: 22 U/L (ref 0–44)
AST: 24 U/L (ref 15–41)
Albumin: 3.3 g/dL — ABNORMAL LOW (ref 3.5–5.0)
Alkaline Phosphatase: 59 U/L (ref 38–126)
Anion gap: 6 (ref 5–15)
BUN: 26 mg/dL — ABNORMAL HIGH (ref 8–23)
CO2: 26 mmol/L (ref 22–32)
Calcium: 8.5 mg/dL — ABNORMAL LOW (ref 8.9–10.3)
Chloride: 101 mmol/L (ref 98–111)
Creatinine, Ser: 0.99 mg/dL (ref 0.44–1.00)
GFR, Estimated: 55 mL/min — ABNORMAL LOW (ref >60)
Glucose, Bld: 97 mg/dL (ref 70–99)
Potassium: 3.6 mmol/L (ref 3.5–5.1)
Sodium: 133 mmol/L — ABNORMAL LOW (ref 135–145)
Total Bilirubin: 0.6 mg/dL (ref 0.3–1.2)
Total Protein: 5.9 g/dL — ABNORMAL LOW (ref 6.5–8.1)

## 2020-08-19 LAB — URINE CULTURE: Culture: NO GROWTH

## 2020-08-19 MED ORDER — ENOXAPARIN SODIUM 40 MG/0.4ML IJ SOSY
40.0000 mg | PREFILLED_SYRINGE | INTRAMUSCULAR | Status: DC
  Administered 2020-08-19 – 2020-08-22 (×4): 40 mg via SUBCUTANEOUS
  Filled 2020-08-19 (×4): qty 0.4

## 2020-08-19 NOTE — Progress Notes (Addendum)
PROGRESS NOTE  Samantha Crane MIW:[id number] DOB: May 13, 1929 DOA: 08/18/2020 PCP: Nadara Eaton, MD  HPI/Recap of past 26 hours: 87 year old female with past medical history of hypertension, stage III chronic kidney disease and anxiety/depression injured her left leg after tripping on her walker and cutting her leg a month ago.  The wound was dressed at the time at the facility she resides in, but she did note shaking and chills yesterday.  Seen by her PCP and was noted to have urinary retention which was initially thought to be source of infection when she came to the emergency room.  However urinalysis negative, but on closer examination, found to have left lower extremity cellulitis and then lab work and vitals confirm sepsis.  Patient started on fluids and antibiotics and admitted to the hospitalist service.  This morning, white blood cell count has slowly improved.  Patient herself feeling better, less leg discomfort  Assessment/Plan: Principal Problem:   Sepsis (HCC) secondary to cellulitis of left leg: Patient meets criteria for sepsis on admission given leukocytosis and tachypnea with cellulitis as source: White count improving.  Continue antibiotics.  Lactic acid level within normal limits. Active Problems:   Anxiety/depression: Continue home medications.    Chronic kidney disease (CKD) stage G3a/A1, moderately decreased glomerular filtration rate (GFR) between 45-59 mL/min/1.73 square meter and albuminuria creatinine ratio less than 30 mg/g (HCC): Looks to be at baseline.    Essential hypertension: Blood pressure stable, continue home medications.    Hyperlipidemia: Continue statin.  Chronic UTIs with urinary retention: On suppressive Macrodantin.  Check urinalysis.  She is complaining of dysuria.  Code Status: Full code  Family Communication: Daughter at the bedside  Disposition Plan: Return back to facility once white blood cell count normalized and can change to p.o.  antibiotics   Consultants: None  Procedures: None  Antimicrobials: IV vancomycin 8/5-present  DVT prophylaxis: Lovenox  Level of care: Med-Surg   Objective: Vitals:   08/19/20 0623 08/19/20 0641  BP: (!) 133/51   Pulse: (!) 54   Resp: 16   Temp:  (!) 97 F (36.1 C)  SpO2: (!) 88%     Intake/Output Summary (Last 24 hours) at 08/19/2020 0926 Last data filed at 08/19/2020 0900 Gross per 24 hour  Intake 529.5 ml  Output --  Net 529.5 ml   Filed Weights   08/18/20 1030  Weight: 59.4 kg   Body mass index is 21.8 kg/m.  Exam:  General: Alert and oriented x3, no acute distress Cardiovascular: Regular rate and rhythm, S1-S2 Respiratory: Clear to auscultation bilaterally Abdomen: Soft, nontender, nondistended, positive bowel sounds Musculoskeletal: Left leg several inches below the knee and the anterior aspect notes a small 1 cm scabbed over wound.  There is surrounding erythema involving almost the entire anterior aspect of the left leg before the ankle Skin: Erythema as described above Psychiatry: Appropriate, no evidence of psychoses Neurology: No focal deficits   Data Reviewed: CBC: Recent Labs  Lab 08/18/20 0107 08/19/20 0347  WBC 21.8* 16.3*  NEUTROABS 20.4*  --   HGB 13.1 11.7*  HCT 39.5 37.2  MCV 94.5 97.9  PLT 227 169   Basic Metabolic Panel: Recent Labs  Lab 08/18/20 0107 08/19/20 0347  NA 137 133*  K 4.1 3.6  CL 97* 101  CO2 30 26  GLUCOSE 131* 97  BUN 27* 26*  CREATININE 1.03* 0.99  CALCIUM 10.0 8.5*   GFR: Estimated Creatinine Clearance: 35.3 mL/min (by C-G formula based on SCr of 0.99  mg/dL). Liver Function Tests: Recent Labs  Lab 08/18/20 0107 08/19/20 0347  AST 26 24  ALT 19 22  ALKPHOS 87 59  BILITOT 0.5 0.6  PROT 7.3 5.9*  ALBUMIN 4.5 3.3*   No results for input(s): LIPASE, AMYLASE in the last 168 hours. No results for input(s): AMMONIA in the last 168 hours. Coagulation Profile: Recent Labs  Lab 08/18/20 0107   INR 1.0   Cardiac Enzymes: No results for input(s): CKTOTAL, CKMB, CKMBINDEX, TROPONINI in the last 168 hours. BNP (last 3 results) No results for input(s): PROBNP in the last 8760 hours. HbA1C: No results for input(s): HGBA1C in the last 72 hours. CBG: No results for input(s): GLUCAP in the last 168 hours. Lipid Profile: No results for input(s): CHOL, HDL, LDLCALC, TRIG, CHOLHDL, LDLDIRECT in the last 72 hours. Thyroid Function Tests: No results for input(s): TSH, T4TOTAL, FREET4, T3FREE, THYROIDAB in the last 72 hours. Anemia Panel: No results for input(s): VITAMINB12, FOLATE, FERRITIN, TIBC, IRON, RETICCTPCT in the last 72 hours. Urine analysis:    Component Value Date/Time   COLORURINE YELLOW 08/18/2020 0033   APPEARANCEUR CLEAR 08/18/2020 0033   LABSPEC 1.010 08/18/2020 0033   PHURINE 7.0 08/18/2020 0033   GLUCOSEU NEGATIVE 08/18/2020 0033   HGBUR NEGATIVE 08/18/2020 0033   BILIRUBINUR NEGATIVE 08/18/2020 0033   KETONESUR NEGATIVE 08/18/2020 0033   PROTEINUR NEGATIVE 08/18/2020 0033   NITRITE NEGATIVE 08/18/2020 0033   LEUKOCYTESUR SMALL (A) 08/18/2020 0033   Sepsis Labs: @LABRCNTIP (procalcitonin:4,lacticidven:4)  ) Recent Results (from the past 240 hour(s))  SARS CORONAVIRUS 2 (TAT 6-24 HRS) Nasopharyngeal Nasopharyngeal Swab     Status: None   Collection Time: 08/18/20  4:29 AM   Specimen: Nasopharyngeal Swab  Result Value Ref Range Status   SARS Coronavirus 2 NEGATIVE NEGATIVE Final    Comment: (NOTE) SARS-CoV-2 target nucleic acids are NOT DETECTED.  The SARS-CoV-2 RNA is generally detectable in upper and lower respiratory specimens during the acute phase of infection. Negative results do not preclude SARS-CoV-2 infection, do not rule out co-infections with other pathogens, and should not be used as the sole basis for treatment or other patient management decisions. Negative results must be combined with clinical observations, patient history, and  epidemiological information. The expected result is Negative.  Fact Sheet for Patients: 10/18/20  Fact Sheet for Healthcare Providers: HairSlick.no  This test is not yet approved or cleared by the [place] FDA and  has been authorized for detection and/or diagnosis of SARS-CoV-2 by FDA under an Emergency Use Authorization (EUA). This EUA will remain  in effect (meaning this test can be used) for the duration of the COVID-19 declaration under Se ction 564(b)(1) of the Act, 21 U.S.C. section 360bbb-3(b)(1), unless the authorization is terminated or revoked sooner.  Performed at [facility], [address] [place], [place]       Studies: No results found.  Scheduled Meds:  atorvastatin  10 mg Oral QHS   buPROPion  150 mg Oral BID   calcium-vitamin D  1 tablet Oral BID   clonazepam  0.25 mg Oral TID   conjugated estrogens  1 Applicatorful Vaginal Once per day on Mon Wed Fri   docusate sodium  100 mg Oral BID   enoxaparin (LOVENOX) injection  30 mg Subcutaneous Q24H   fluticasone  1 spray Each Nare QHS   gabapentin  300 mg Oral QHS   lisinopril  20 mg Oral Daily   loratadine  10 mg Oral Daily  mirtazapine  15 mg Oral QHS   multivitamin with minerals  1 tablet Oral Daily   nitrofurantoin (macrocrystal-monohydrate)  100 mg Oral Q12H   pantoprazole  40 mg Oral Daily   sertraline  100 mg Oral Daily   sodium chloride  1 spray Each Nare Daily    Continuous Infusions:  vancomycin 750 mg (08/19/20 0002)     LOS: 0 days     Hollice Espy, MD [facility]   08/19/2020, 9:26 AM

## 2020-08-19 NOTE — Plan of Care (Signed)
  Problem: Education: Goal: Knowledge of General Education information will improve Description Including pain rating scale, medication(s)/side effects and non-pharmacologic comfort measures Outcome: Progressing   

## 2020-08-20 ENCOUNTER — Inpatient Hospital Stay (HOSPITAL_COMMUNITY): Payer: Medicare Other

## 2020-08-20 DIAGNOSIS — J9621 Acute and chronic respiratory failure with hypoxia: Secondary | ICD-10-CM | POA: Diagnosis not present

## 2020-08-20 DIAGNOSIS — I5032 Chronic diastolic (congestive) heart failure: Secondary | ICD-10-CM | POA: Diagnosis not present

## 2020-08-20 DIAGNOSIS — I5031 Acute diastolic (congestive) heart failure: Secondary | ICD-10-CM

## 2020-08-20 DIAGNOSIS — I5033 Acute on chronic diastolic (congestive) heart failure: Secondary | ICD-10-CM | POA: Diagnosis not present

## 2020-08-20 LAB — BASIC METABOLIC PANEL
Anion gap: 5 (ref 5–15)
BUN: 22 mg/dL (ref 8–23)
CO2: 28 mmol/L (ref 22–32)
Calcium: 8.5 mg/dL — ABNORMAL LOW (ref 8.9–10.3)
Chloride: 102 mmol/L (ref 98–111)
Creatinine, Ser: 0.89 mg/dL (ref 0.44–1.00)
GFR, Estimated: >60 mL/min (ref >60)
Glucose, Bld: 134 mg/dL — ABNORMAL HIGH (ref 70–99)
Potassium: 3.9 mmol/L (ref 3.5–5.1)
Sodium: 135 mmol/L (ref 135–145)

## 2020-08-20 LAB — CBC
HCT: 36.2 % (ref 36.0–46.0)
Hemoglobin: 11.8 g/dL — ABNORMAL LOW (ref 12.0–15.0)
MCH: 31.6 pg (ref 26.0–34.0)
MCHC: 32.6 g/dL (ref 30.0–36.0)
MCV: 96.8 fL (ref 80.0–100.0)
Platelets: 196 10*3/uL (ref 150–400)
RBC: 3.74 MIL/uL — ABNORMAL LOW (ref 3.87–5.11)
RDW: 12.7 % (ref 11.5–15.5)
WBC: 16.8 10*3/uL — ABNORMAL HIGH (ref 4.0–10.5)
nRBC: 0 % (ref 0.0–0.2)

## 2020-08-20 LAB — BRAIN NATRIURETIC PEPTIDE: B Natriuretic Peptide: 252.2 pg/mL — ABNORMAL HIGH (ref 0.0–100.0)

## 2020-08-20 LAB — PROCALCITONIN: Procalcitonin: 1.92 ng/mL

## 2020-08-20 MED ORDER — SODIUM CHLORIDE 0.9 % IV SOLN
3.0000 g | Freq: Four times a day (QID) | INTRAVENOUS | Status: DC
  Administered 2020-08-20 – 2020-08-23 (×11): 3 g via INTRAVENOUS
  Filled 2020-08-20 (×2): qty 8
  Filled 2020-08-20: qty 3
  Filled 2020-08-20: qty 8
  Filled 2020-08-20 (×3): qty 3
  Filled 2020-08-20: qty 8
  Filled 2020-08-20 (×6): qty 3

## 2020-08-20 MED ORDER — SODIUM CHLORIDE 0.9 % IV SOLN
INTRAVENOUS | Status: DC | PRN
  Administered 2020-08-20: 1000 mL via INTRAVENOUS

## 2020-08-20 MED ORDER — FUROSEMIDE 10 MG/ML IJ SOLN
20.0000 mg | Freq: Two times a day (BID) | INTRAMUSCULAR | Status: DC
  Administered 2020-08-20 – 2020-08-21 (×3): 20 mg via INTRAVENOUS
  Filled 2020-08-20 (×3): qty 2

## 2020-08-20 NOTE — Progress Notes (Signed)
During shift assessment, auscultated expiratory wheezes. Patient was also coughing when the tech and I did a full linen change. On 2 liters of 02, patient's 02 sat was 91-92%. Patient ambulated in the hall with PT.  Made Dr. Rito Ehrlich, MD aware of these assessment findings. Respiratory therapy has also been notified to give patient a nebulizer treatment from patient's PRN med list. Will continue to monitor patient.

## 2020-08-20 NOTE — Plan of Care (Signed)
  Problem: Nutrition: Goal: Adequate nutrition will be maintained Outcome: Progressing   Problem: Elimination: Goal: Will not experience complications related to bowel motility Outcome: Progressing   Problem: Pain Managment: Goal: General experience of comfort will improve Outcome: Progressing   Problem: Safety: Goal: Ability to remain free from injury will improve Outcome: Progressing   

## 2020-08-20 NOTE — Plan of Care (Signed)
  Problem: Education: Goal: Knowledge of General Education information will improve Description Including pain rating scale, medication(s)/side effects and non-pharmacologic comfort measures Outcome: Progressing   

## 2020-08-20 NOTE — Progress Notes (Signed)
PROGRESS NOTE  Samantha Crane WVP:[id number] DOB: [birth year] DOA: 08/18/2020 PCP: Nadara Eaton, MD  HPI/Recap of past 86 hours: 87 year old female with past medical history of hypertension, stage III chronic kidney disease and anxiety/depression injured her left leg after tripping on her walker and cutting her leg a month ago.  The wound was dressed at the time at the facility she resides in, but she did note shaking and chills yesterday.  Seen by her PCP and was noted to have urinary retention which was initially thought to be source of infection when she came to the emergency room.  However urinalysis negative, but on closer examination, found to have left lower extremity cellulitis and then lab work and vitals confirm sepsis.  Patient started on fluids and antibiotics and admitted to the hospitalist service.  This morning, patient noted to be having some cough with oxygen saturations around 91-92%.  WBC with slight increase from previous day.  Chest x-ray notes bilateral pleural effusions.  Procalcitonin and BNP elevated.  Patient started on IV Lasix and antibiotics changed to Unasyn.  Patient states with supplemental oxygen, her breathing is a little bit easier.  Assessment/Plan: Principal Problem:   Sepsis (HCC) secondary to cellulitis of left leg:, UTI and also possible pneumonia.  Patient meets criteria for sepsis on admission given leukocytosis and tachypnea with cellulitis as source: White count now with slight increase.  Change antibiotics to Unasyn to cover UTI and also possible healthcare associated pneumonia. Active Problems:   Anxiety/depression: Continue home medications.    Chronic kidney disease (CKD) stage G3a/A1, moderately decreased glomerular filtration rate (GFR) between 45-59 mL/min/1.73 square meter and albuminuria creatinine ratio less than 30 mg/g (HCC): Looks to be at baseline.  Acute diastolic heart failure: IV Lasix    Essential hypertension: Blood pressure  stable, continue home medications.    Hyperlipidemia: Continue statin.  Chronic UTIs with urinary retention: On suppressive Macrodantin.  Urinalysis notes increased white cells, but nitrite negative, but some results could be off due to suppressive antibiotics.  Have stopped Macrodantin and patient now on Unasyn  Code Status: Full code  Family Communication: Grandson at the bedside  Disposition Plan: Return back to facility once infection is treated, diuresed and off of oxygen  Consultants: None  Procedures: None  Antimicrobials: IV vancomycin 8/5-8/7 IV Unasyn 8/7-present  DVT prophylaxis: Lovenox  Level of care: Med-Surg   Objective: Vitals:   08/19/20 2211 08/20/20 0605  BP: (!) 167/63 (!) 132/42  Pulse: 68 64  Resp: 17 16  Temp: 98.9 F (37.2 C) 98 F (36.7 C)  SpO2: 100% 97%    Intake/Output Summary (Last 24 hours) at 08/20/2020 1005 Last data filed at 08/20/2020 5462 Gross per 24 hour  Intake 1230 ml  Output 1395 ml  Net -165 ml    Filed Weights   08/18/20 1030  Weight: 59.4 kg   Body mass index is 21.8 kg/m.  Exam:  General: Alert and oriented x3, no acute distress Cardiovascular: Regular rate and rhythm, S1-S2 Respiratory: Decreased breath sounds bibasilar, few bilateral crackles Abdomen: Soft, nontender, nondistended, positive bowel sounds Musculoskeletal: Left leg several inches below the knee and the anterior aspect notes a small 1 cm scabbed over wound.  There is surrounding erythema involving almost the entire anterior aspect of the left leg before the ankle,  Skin: Erythema as described above, slightly improved Psychiatry: Appropriate, no evidence of psychoses Neurology: No focal deficits   Data Reviewed: CBC: Recent Labs  Lab 08/18/20 0107 08/19/20  4259 08/20/20 0235  WBC 21.8* 16.3* 16.8*  NEUTROABS 20.4*  --   --   HGB 13.1 11.7* 11.8*  HCT 39.5 37.2 36.2  MCV 94.5 97.9 96.8  PLT 227 169 196    Basic Metabolic  Panel: Recent Labs  Lab 08/18/20 0107 08/19/20 0347 08/20/20 0235  NA 137 133* 135  K 4.1 3.6 3.9  CL 97* 101 102  CO2 30 26 28   GLUCOSE 131* 97 134*  BUN 27* 26* 22  CREATININE 1.03* 0.99 0.89  CALCIUM 10.0 8.5* 8.5*    GFR: Estimated Creatinine Clearance: 39.3 mL/min (by C-G formula based on SCr of 0.89 mg/dL). Liver Function Tests: Recent Labs  Lab 08/18/20 0107 08/19/20 0347  AST 26 24  ALT 19 22  ALKPHOS 87 59  BILITOT 0.5 0.6  PROT 7.3 5.9*  ALBUMIN 4.5 3.3*    No results for input(s): LIPASE, AMYLASE in the last 168 hours. No results for input(s): AMMONIA in the last 168 hours. Coagulation Profile: Recent Labs  Lab 08/18/20 0107  INR 1.0    Cardiac Enzymes: No results for input(s): CKTOTAL, CKMB, CKMBINDEX, TROPONINI in the last 168 hours. BNP (last 3 results) No results for input(s): PROBNP in the last 8760 hours. HbA1C: No results for input(s): HGBA1C in the last 72 hours. CBG: No results for input(s): GLUCAP in the last 168 hours. Lipid Profile: No results for input(s): CHOL, HDL, LDLCALC, TRIG, CHOLHDL, LDLDIRECT in the last 72 hours. Thyroid Function Tests: No results for input(s): TSH, T4TOTAL, FREET4, T3FREE, THYROIDAB in the last 72 hours. Anemia Panel: No results for input(s): VITAMINB12, FOLATE, FERRITIN, TIBC, IRON, RETICCTPCT in the last 72 hours. Urine analysis:    Component Value Date/Time   COLORURINE YELLOW 08/19/2020 1535   APPEARANCEUR HAZY (A) 08/19/2020 1535   LABSPEC 1.006 08/19/2020 1535   PHURINE 6.0 08/19/2020 1535   GLUCOSEU NEGATIVE 08/19/2020 1535   HGBUR MODERATE (A) 08/19/2020 1535   BILIRUBINUR NEGATIVE 08/19/2020 1535   KETONESUR NEGATIVE 08/19/2020 1535   PROTEINUR NEGATIVE 08/19/2020 1535   NITRITE NEGATIVE 08/19/2020 1535   LEUKOCYTESUR MODERATE (A) 08/19/2020 1535   Sepsis Labs: @LABRCNTIP (procalcitonin:4,lacticidven:4)  ) Recent Results (from the past 240 hour(s))  Urine Culture     Status: None    Collection Time: 08/18/20 12:33 AM   Specimen: In/Out Cath Urine  Result Value Ref Range Status   Specimen Description   Final    IN/OUT CATH URINE Performed at [facility], [address], [place], [place]    Special Requests   Final    NONE Performed at [facility], [address], [place], [place]    Culture   Final    NO GROWTH Performed at [facility], [address] [place], [address] [place]    Report Status 08/19/2020 FINAL  Final  Blood Culture (routine x 2)     Status: None (Preliminary result)   Collection Time: 08/18/20 12:38 AM   Specimen: BLOOD  Result Value Ref Range Status   Specimen Description BLOOD RIGHT ANTECUBITAL  Final   Special Requests AEROBIC BOTTLE ONLY Blood Culture adequate volume  Final   Culture   Final    NO GROWTH 2 DAYS Performed at [facility], [address], [place], [address] [place]    Report Status PENDING  Incomplete  SARS CORONAVIRUS 2 (TAT 6-24 HRS) Nasopharyngeal Nasopharyngeal Swab     Status: None   Collection Time: 08/18/20  4:29 AM   Specimen: Nasopharyngeal Swab  Result Value Ref Range Status   SARS Coronavirus 2 NEGATIVE NEGATIVE Final    Comment: (NOTE) SARS-CoV-2 target nucleic acids are NOT DETECTED.  The SARS-CoV-2 RNA is generally detectable in upper and lower respiratory specimens during the acute phase of infection. Negative results do not preclude SARS-CoV-2 infection, do not rule out co-infections with other pathogens, and should not be used as the sole basis for treatment or other patient management decisions. Negative results must be combined with clinical observations, patient history, and epidemiological information. The expected result is Negative.  Fact Sheet for Patients: HairSlick.no  Fact Sheet for Healthcare Providers: quierodirigir.com  This test is not yet approved or  cleared by the [place] FDA and  has been authorized for detection and/or diagnosis of SARS-CoV-2 by FDA under an Emergency Use Authorization (EUA). This EUA will remain  in effect (meaning this test can be used) for the duration of the COVID-19 declaration under Se ction 564(b)(1) of the Act, 21 U.S.C. section 360bbb-3(b)(1), unless the authorization is terminated or revoked sooner.  Performed at [facility], [address], [place], [place] [address]       Studies: No results found.  Scheduled Meds:  atorvastatin  10 mg Oral QHS   buPROPion  150 mg Oral BID   calcium-vitamin D  1 tablet Oral BID   clonazepam  0.25 mg Oral TID   conjugated estrogens  1 Applicatorful Vaginal Once per day on Mon Wed Fri   docusate sodium  100 mg Oral BID   enoxaparin (LOVENOX) injection  40 mg Subcutaneous Q24H   fluticasone  1 spray Each Nare QHS   gabapentin  300 mg Oral QHS   lisinopril  20 mg Oral Daily   loratadine  10 mg Oral Daily   mirtazapine  15 mg Oral QHS   multivitamin with minerals  1 tablet Oral Daily   nitrofurantoin (macrocrystal-monohydrate)  100 mg Oral Q12H   pantoprazole  40 mg Oral Daily   sertraline  100 mg Oral Daily   sodium chloride  1 spray Each Nare Daily    Continuous Infusions:  sodium chloride     vancomycin 750 mg (08/19/20 2343)     LOS: 1 day     Hollice Espy, MD [facility]   08/20/2020, 10:05 AM

## 2020-08-20 NOTE — Evaluation (Signed)
Physical Therapy Evaluation Patient Details Name: Samantha Crane MRN: [id number] DOB: 03-28-1929 Today's Date: 08/20/2020   History of Present Illness  Pt admitted with sepsis 2* L LE cellulitis and with hx of CKD, spinal stenosis, T-9 compression fx and "orthostatic tremor".  Clinical Impression  Pt admitted as above and presenting with functional mobility limitations 2* generalized weakness, balance deficits and limited endurance.  Pt hopes to progress to dc to prior ALF.    Follow Up Recommendations Home health PT (At ALF)    Equipment Recommendations  None recommended by PT    Recommendations for Other Services OT consult     Precautions / Restrictions Precautions Precautions: Fall Restrictions Weight Bearing Restrictions: No      Mobility  Bed Mobility Overal bed mobility: Needs Assistance Bed Mobility: Supine to Sit     Supine to sit: Min guard     General bed mobility comments: Increased time    Transfers Overall transfer level: Needs assistance Equipment used: Rolling walker (2 wheeled) Transfers: Sit to/from Stand Sit to Stand: Min assist;+2 physical assistance;+2 safety/equipment         General transfer comment: Cues for LE management and use of UEs to self assist.  Physical assist to bring wt up and fwd and to balance in standing with RW  Ambulation/Gait Ambulation/Gait assistance: Min assist;Mod assist Gait Distance (Feet): 111 Feet Assistive device: Rolling walker (2 wheeled) Gait Pattern/deviations: Step-to pattern;Decreased step length - right;Decreased step length - left;Shuffle;Trunk flexed Gait velocity: decr   General Gait Details: INcreased time with cues for posture and position from RW and physical assist for balance/support and to manage RW  Stairs            Wheelchair Mobility    Modified Rankin (Stroke Patients Only)       Balance Overall balance assessment: Needs assistance Sitting-balance support: No upper extremity  supported;Feet supported Sitting balance-Leahy Scale: Fair     Standing balance support: Bilateral upper extremity supported Standing balance-Leahy Scale: Poor                               Pertinent Vitals/Pain Pain Assessment: No/denies pain    Home Living Family/patient expects to be discharged to:: Assisted living                      Prior Function Level of Independence: Needs assistance   Gait / Transfers Assistance Needed: RW  ADL's / Homemaking Assistance Needed: ALF resident        Hand Dominance        Extremity/Trunk Assessment   Upper Extremity Assessment Upper Extremity Assessment: Generalized weakness    Lower Extremity Assessment Lower Extremity Assessment: Generalized weakness       Communication   Communication: No difficulties  Cognition Arousal/Alertness: Awake/alert Behavior During Therapy: WFL for tasks assessed/performed Overall Cognitive Status: Within Functional Limits for tasks assessed                                        General Comments      Exercises General Exercises - Lower Extremity Ankle Circles/Pumps: AROM;Both;15 reps;Supine Quad Sets: AROM;Both;10 reps;Supine   Assessment/Plan    PT Assessment Patient needs continued PT services  PT Problem List Decreased strength;Decreased activity tolerance;Decreased balance;Decreased mobility;Decreased knowledge of use of DME  PT Treatment Interventions DME instruction;Gait training;Functional mobility training;Therapeutic activities;Therapeutic exercise;Patient/family education;Balance training    PT Goals (Current goals can be found in the Care Plan section)  Acute Rehab PT Goals Patient Stated Goal: Regain IND and return home PT Goal Formulation: With patient Time For Goal Achievement: 09/03/20 Potential to Achieve Goals: Good    Frequency Min 3X/week   Barriers to discharge        Co-evaluation                AM-PAC PT "6 Clicks" Mobility  Outcome Measure Help needed turning from your back to your side while in a flat bed without using bedrails?: A Little Help needed moving from lying on your back to sitting on the side of a flat bed without using bedrails?: A Little Help needed moving to and from a bed to a chair (including a wheelchair)?: A Lot Help needed standing up from a chair using your arms (e.g., wheelchair or bedside chair)?: A Lot Help needed to walk in hospital room?: A Little Help needed climbing 3-5 steps with a railing? : A Lot 6 Click Score: 15    End of Session Equipment Utilized During Treatment: Gait belt Activity Tolerance: Patient tolerated treatment well;Patient limited by fatigue Patient left: in chair;with call bell/phone within reach;with chair alarm set;with family/visitor present Nurse Communication: Mobility status PT Visit Diagnosis: Difficulty in walking, not elsewhere classified (R26.2);Muscle weakness (generalized) (M62.81)    Time: 1856-3149 PT Time Calculation (min) (ACUTE ONLY): 31 min   Charges:   PT Evaluation $PT Eval Low Complexity: 1 Low PT Treatments $Gait Training: 8-22 mins        Mauro Kaufmann PT Acute Rehabilitation Services Pager [phone] Office [phone]   Ailanie Ruttan 08/20/2020, 2:45 PM

## 2020-08-20 NOTE — Progress Notes (Signed)
Pharmacy Antibiotic Note  Samantha Crane is a 87 y.o. female on macrobid PTA for UTI presented to the ED on 08/18/2020 with c/o chills and a cut on her leg. She was started on vancomycin in admission for LLE cellulitis.  Pharmacy has been consulted to change abx to unasyn for cellulitis, UTI and PNA coverage.  Plan: - unasyn 3gm IV q6h  _______________________________  Height: 5\' 5"  (165.1 cm) Weight: 59.4 kg (131 lb) IBW/kg (Calculated) : 57  Temp (24hrs), Avg:98.1 F (36.7 C), Min:97.5 F (36.4 C), Max:98.9 F (37.2 C)  Recent Labs  Lab 08/18/20 0107 08/18/20 0338 08/19/20 0347 08/20/20 0235  WBC 21.8*  --  16.3* 16.8*  CREATININE 1.03*  --  0.99 0.89  LATICACIDVEN 1.2 1.5  --   --     Estimated Creatinine Clearance: 39.3 mL/min (by C-G formula based on SCr of 0.89 mg/dL).    Allergies  Allergen Reactions   Prednisone     Other reaction(s): Other (See Comments) Manic tendencies   Amlodipine Swelling   Amoxicillin    Amoxicillin-Pot Clavulanate Diarrhea   Lactose Intolerance (Gi)    Limonene Diarrhea   Sulfa Antibiotics      Thank you for allowing pharmacy to be a part of this patient's care.  10/20/20 08/20/2020 1:24 PM

## 2020-08-21 LAB — BASIC METABOLIC PANEL
Anion gap: 7 (ref 5–15)
BUN: 18 mg/dL (ref 8–23)
CO2: 30 mmol/L (ref 22–32)
Calcium: 8.6 mg/dL — ABNORMAL LOW (ref 8.9–10.3)
Chloride: 99 mmol/L (ref 98–111)
Creatinine, Ser: 0.84 mg/dL (ref 0.44–1.00)
GFR, Estimated: >60 mL/min (ref >60)
Glucose, Bld: 127 mg/dL — ABNORMAL HIGH (ref 70–99)
Potassium: 4 mmol/L (ref 3.5–5.1)
Sodium: 136 mmol/L (ref 135–145)

## 2020-08-21 LAB — CBC
HCT: 35.5 % — ABNORMAL LOW (ref 36.0–46.0)
Hemoglobin: 11.4 g/dL — ABNORMAL LOW (ref 12.0–15.0)
MCH: 31 pg (ref 26.0–34.0)
MCHC: 32.1 g/dL (ref 30.0–36.0)
MCV: 96.5 fL (ref 80.0–100.0)
Platelets: 181 10*3/uL (ref 150–400)
RBC: 3.68 MIL/uL — ABNORMAL LOW (ref 3.87–5.11)
RDW: 12.6 % (ref 11.5–15.5)
WBC: 9.9 10*3/uL (ref 4.0–10.5)
nRBC: 0 % (ref 0.0–0.2)

## 2020-08-21 LAB — PROCALCITONIN: Procalcitonin: 1.43 ng/mL

## 2020-08-21 NOTE — Progress Notes (Signed)
Physical Therapy Treatment Patient Details Name: Samantha Crane MRN: [id number] DOB: 25-Feb-1929 Today's Date: 08/21/2020    History of Present Illness Pt admitted with sepsis 2* L LE cellulitis and with hx of CKD, spinal stenosis, T-9 compression fx and "orthostatic tremor".  Now PNA.    PT Comments    Pt NOT progressing well enough to return to ALF level of care. She was unable to self get OOB and unable to self use toilet.  General bed mobility comments: pt incont urine in bed despite periwick required increased assist to transfer to EOB.  Weak.  Slow. Poor sitting balance.  Severe forward flex head. General transfer comment: required increased assist to transfer from elevated bed to [facility] with X 2 posterior LOB and incomplete 1/4 pivot.  75% VC's on proper hand placement and safety with turns.  Slow to respond to any balance defict.  Poor self correction reaction.  Pt unable to maintain a sfae static standing stance and unable to self perform peri care.  HIGH FALL RISK. General Gait Details: decreased amb distance due to fatigue from using BSC just prior.  Poor forward flex neck posture and short shuffled steps.  Unsteady. MAX c/o weakness.  RA ranged from 91 - 86% also present was Mod coughing.  Daughter present and assisted with following with recliner.  HR increased to 124.  Distance limited.  Used recliner to return her back to her room. Daughter present and agrees pt is not safe to D/C back to ALF level  Will update LPT new rec  SATURATION QUALIFICATIONS: (This note is used to comply with regulatory documentation for home oxygen)  Patient Saturations on Room Air at Rest = 94%  Patient Saturations on Room Air while Ambulating 55 feet = 86 - 92%  Patient Saturations on  1 Liters of oxygen while Ambulating = 90 - 94%  Please briefly explain why patient needs home oxygen:  pt requires supplemental oxygen to achieve therapeutic levels   Follow Up Recommendations  SNF     Equipment  Recommendations  None recommended by PT    Recommendations for Other Services       Precautions / Restrictions Precautions Precautions: Fall Precaution Comments: monitor O2 Restrictions Weight Bearing Restrictions: No    Mobility  Bed Mobility Overal bed mobility: Needs Assistance Bed Mobility: Supine to Sit     Supine to sit: Max assist     General bed mobility comments: pt incont urine in bed despite periwick required increased assist to transfer to EOB.  Weak.  Slow. Poor sitting balance.  Severe forward flex head.    Transfers Overall transfer level: Needs assistance Equipment used: Rolling walker (2 wheeled) Transfers: Sit to/from [employer] Sit to Stand: Min assist;Mod assist Stand pivot transfers: Mod assist;Max assist       General transfer comment: required increased assist to transfer from elevated bed to [facility] with X 2 posterior LOB and incomplete 1/4 pivot.  75% VC's on proper hand placement and safety with turns.  Slow to respond to any balance defict.  Poor self correction reaction.  Pt unable to maintain a sfae static standing stance and unable to self perform peri care.  HIGH FALL RISK.  Ambulation/Gait Ambulation/Gait assistance: Min assist;Mod assist Gait Distance (Feet): 55 Feet Assistive device: Rolling walker (2 wheeled) Gait Pattern/deviations: Step-to pattern;Decreased step length - right;Decreased step length - left;Shuffle;Trunk flexed Gait velocity: decreased   General Gait Details: decreased amb distance due to fatigue from using BSC just prior.  Poor forward flex neck posture and short shuffled steps.  MAX c/o weakness.  RA ranged from 91 - 86% also present was Mod coughing.  Daughter present and assisted with following with recliner.  HR increased to 124.  Distance limited.  Used recliner to return her back to her room.   Stairs             Wheelchair Mobility    Modified Rankin (Stroke Patients Only)        Balance                                            Cognition Arousal/Alertness: Awake/alert Behavior During Therapy: WFL for tasks assessed/performed Overall Cognitive Status: Within Functional Limits for tasks assessed                                 General Comments: AX O x 3 very pleasant lady and cooperative but also with poor insight reguarding her current meical condition and mobility decline.  Max c/o weakness and fatigue after activity.      Exercises      General Comments        Pertinent Vitals/Pain Pain Assessment: No/denies pain    Home Living                      Prior Function            PT Goals (current goals can now be found in the care plan section) Progress towards PT goals: Progressing toward goals    Frequency    Min 3X/week      PT Plan Discharge plan needs to be updated    Co-evaluation              AM-PAC PT "6 Clicks" Mobility   Outcome Measure  Help needed turning from your back to your side while in a flat bed without using bedrails?: A Lot Help needed moving from lying on your back to sitting on the side of a flat bed without using bedrails?: A Lot Help needed moving to and from a bed to a chair (including a wheelchair)?: A Lot Help needed standing up from a chair using your arms (e.g., wheelchair or bedside chair)?: A Lot Help needed to walk in hospital room?: A Lot Help needed climbing 3-5 steps with a railing? : Total 6 Click Score: 11    End of Session Equipment Utilized During Treatment: Gait belt Activity Tolerance: Patient tolerated treatment well;Patient limited by fatigue Patient left: in chair;with call bell/phone within reach;with chair alarm set;with family/visitor present Nurse Communication: Mobility status PT Visit Diagnosis: Difficulty in walking, not elsewhere classified (R26.2);Muscle weakness (generalized) (M62.81)     Time: 8182-9937 PT Time Calculation  (min) (ACUTE ONLY): 42 min  Charges:  $Gait Training: 8-22 mins $Therapeutic Activity: 23-37 mins                    Felecia Shelling  PTA Acute  Rehabilitation Services Pager      [phone] Office      [phone]

## 2020-08-21 NOTE — Progress Notes (Signed)
PROGRESS NOTE  Samantha Crane BTD:[id number] DOB: 06/15/1929 DOA: 08/18/2020 PCP: Nadara Eaton, MD  HPI/Recap of past 45 hours: 87 year old female from assisted living with past medical history of hypertension, stage III chronic kidney disease and anxiety/depression injured her left leg after tripping on her walker and cutting her leg a month ago.  The wound was dressed at the time at the facility she resides in, but she did note shaking and chills yesterday.  Seen by her PCP and was noted to have urinary retention which was initially thought to be source of infection when she came to the emergency room.  However urinalysis negative, but on closer examination, found to have left lower extremity cellulitis and then lab work and vitals confirm sepsis.  Patient started on fluids and antibiotics and admitted to the hospitalist service.  By following day, 8/7, patient noted to be hypoxic requiring oxygen with worsening white blood cell count and increasing procalcitonin so patient started on Lasix, oxygen and antibiotics changed to broad-spectrum to cover cellulitis, possible pneumonia and UTI.  Today, procalcitonin and white count much improved.  Patient has diuresed over 3 L.  She is feeling better although still somewhat fatigued, especially with ambulation.  Assessment/Plan: Principal Problem:   Sepsis (HCC) secondary to cellulitis of left leg:, UTI and also possible pneumonia.  Patient meets criteria for sepsis on admission given leukocytosis and tachypnea with cellulitis as source: Antibiotics changed over to Unasyn.  Procalcitonin and white count improved.  Sepsis resolved.  Active Problems:   Anxiety/depression: Continue home medications.    Chronic kidney disease (CKD) stage G3a/A1, moderately decreased glomerular filtration rate (GFR) between 45-59 mL/min/1.73 square meter and albuminuria creatinine ratio less than 30 mg/g [facility]): Looks better than baseline.  She may be closer to stage  II.  Acute diastolic heart failure: IV Lasix, has responded with diuresis of almost 3 L.  We will go ahead and check echocardiogram.    Essential hypertension: Blood pressure stable, continue home medications.    Hyperlipidemia: Continue statin.  Chronic UTIs with urinary retention: On suppressive Macrodantin.  Urinalysis notes increased white cells, but nitrite negative, but some results could be off due to suppressive antibiotics.  Have stopped Macrodantin and patient now on Unasyn.  White count improved.  Code Status: Full code  Family Communication: Updated daughter by phone  Disposition Plan: Patient from assisted living, but will need to go to skilled nursing for rehab.  Discharge once infection is better treated, weaned off of oxygen and fully diuresed.  Consultants: None  Procedures: None  Antimicrobials: IV vancomycin 8/5-8/7 IV Unasyn 8/7-present  DVT prophylaxis: Lovenox  Level of care: Med-Surg   Objective: Vitals:   08/20/20 2130 08/21/20 0553  BP: (!) 151/62 (!) 143/62  Pulse: 67 67  Resp: 16 16  Temp: 98.2 F (36.8 C) 98.4 F (36.9 C)  SpO2: 99% 97%    Intake/Output Summary (Last 24 hours) at 08/21/2020 0916 Last data filed at 08/21/2020 0600 Gross per 24 hour  Intake 632.08 ml  Output 1525 ml  Net -892.92 ml    Filed Weights   08/18/20 1030  Weight: 59.4 kg   Body mass index is 21.8 kg/m.  Exam:  General: Alert and oriented x3, no acute distress Cardiovascular: Regular rate and rhythm, S1-S2 Respiratory: Decreased breath sounds bibasilar, few bilateral crackles Abdomen: Soft, nontender, nondistended, positive bowel sounds Musculoskeletal: Left leg several inches below the knee and the anterior aspect notes a small 1 cm scabbed over wound.  Erythema is almost fully resolved Skin: As above, erythema almost fully resolved Psychiatry: Appropriate, no evidence of psychoses Neurology: No focal deficits   Data Reviewed: CBC: Recent Labs   Lab 08/18/20 0107 08/19/20 0347 08/20/20 0235 08/21/20 0317  WBC 21.8* 16.3* 16.8* 9.9  NEUTROABS 20.4*  --   --   --   HGB 13.1 11.7* 11.8* 11.4*  HCT 39.5 37.2 36.2 35.5*  MCV 94.5 97.9 96.8 96.5  PLT 227 169 196 181    Basic Metabolic Panel: Recent Labs  Lab 08/18/20 0107 08/19/20 0347 08/20/20 0235 08/21/20 0317  NA 137 133* 135 136  K 4.1 3.6 3.9 4.0  CL 97* 101 102 99  CO2 30 26 28 30   GLUCOSE 131* 97 134* 127*  BUN 27* 26* 22 18  CREATININE 1.03* 0.99 0.89 0.84  CALCIUM 10.0 8.5* 8.5* 8.6*    GFR: Estimated Creatinine Clearance: 41.7 mL/min (by C-G formula based on SCr of 0.84 mg/dL). Liver Function Tests: Recent Labs  Lab 08/18/20 0107 08/19/20 0347  AST 26 24  ALT 19 22  ALKPHOS 87 59  BILITOT 0.5 0.6  PROT 7.3 5.9*  ALBUMIN 4.5 3.3*    No results for input(s): LIPASE, AMYLASE in the last 168 hours. No results for input(s): AMMONIA in the last 168 hours. Coagulation Profile: Recent Labs  Lab 08/18/20 0107  INR 1.0    Cardiac Enzymes: No results for input(s): CKTOTAL, CKMB, CKMBINDEX, TROPONINI in the last 168 hours. BNP (last 3 results) No results for input(s): PROBNP in the last 8760 hours. HbA1C: No results for input(s): HGBA1C in the last 72 hours. CBG: No results for input(s): GLUCAP in the last 168 hours. Lipid Profile: No results for input(s): CHOL, HDL, LDLCALC, TRIG, CHOLHDL, LDLDIRECT in the last 72 hours. Thyroid Function Tests: No results for input(s): TSH, T4TOTAL, FREET4, T3FREE, THYROIDAB in the last 72 hours. Anemia Panel: No results for input(s): VITAMINB12, FOLATE, FERRITIN, TIBC, IRON, RETICCTPCT in the last 72 hours. Urine analysis:    Component Value Date/Time   COLORURINE YELLOW 08/19/2020 1535   APPEARANCEUR HAZY (A) 08/19/2020 1535   LABSPEC 1.006 08/19/2020 1535   PHURINE 6.0 08/19/2020 1535   GLUCOSEU NEGATIVE 08/19/2020 1535   HGBUR MODERATE (A) 08/19/2020 1535   BILIRUBINUR NEGATIVE 08/19/2020 1535    KETONESUR NEGATIVE 08/19/2020 1535   PROTEINUR NEGATIVE 08/19/2020 1535   NITRITE NEGATIVE 08/19/2020 1535   LEUKOCYTESUR MODERATE (A) 08/19/2020 1535   Sepsis Labs: @LABRCNTIP (procalcitonin:4,lacticidven:4)  ) Recent Results (from the past 240 hour(s))  Urine Culture     Status: None   Collection Time: 08/18/20 12:33 AM   Specimen: In/Out Cath Urine  Result Value Ref Range Status   Specimen Description   Final    IN/OUT CATH URINE Performed at [facility], [address], [place], [place]    Special Requests   Final    NONE Performed at [facility], [address] [place], [place]    Culture   Final    NO GROWTH Performed at [facility], [address], [place], [address] [place]    Report Status 08/19/2020 FINAL  Final  Blood Culture (routine x 2)     Status: None (Preliminary result)   Collection Time: 08/18/20 12:38 AM   Specimen: BLOOD  Result Value Ref Range Status   Specimen Description BLOOD RIGHT ANTECUBITAL  Final   Special Requests AEROBIC BOTTLE ONLY Blood Culture adequate volume  Final  Culture   Final    NO GROWTH 2 DAYS Performed at [facility], [address] [place], [place] [address]    Report Status PENDING  Incomplete  SARS CORONAVIRUS 2 (TAT 6-24 HRS) Nasopharyngeal Nasopharyngeal Swab     Status: None   Collection Time: 08/18/20  4:29 AM   Specimen: Nasopharyngeal Swab  Result Value Ref Range Status   SARS Coronavirus 2 NEGATIVE NEGATIVE Final    Comment: (NOTE) SARS-CoV-2 target nucleic acids are NOT DETECTED.  The SARS-CoV-2 RNA is generally detectable in upper and lower respiratory specimens during the acute phase of infection. Negative results do not preclude SARS-CoV-2 infection, do not rule out co-infections with other pathogens, and should not be used as the sole basis for treatment or other patient management decisions. Negative results must be combined with  clinical observations, patient history, and epidemiological information. The expected result is Negative.  Fact Sheet for Patients: HairSlick.no  Fact Sheet for Healthcare Providers: quierodirigir.com  This test is not yet approved or cleared by the [place] FDA and  has been authorized for detection and/or diagnosis of SARS-CoV-2 by FDA under an Emergency Use Authorization (EUA). This EUA will remain  in effect (meaning this test can be used) for the duration of the COVID-19 declaration under Se ction 564(b)(1) of the Act, 21 U.S.C. section 360bbb-3(b)(1), unless the authorization is terminated or revoked sooner.  Performed at [facility], [address] [place], [place] [address]       Studies: DG Chest 2 View  Result Date: 08/20/2020 CLINICAL DATA:  Audible wheezing. EXAM: CHEST - 2 VIEW COMPARISON:  08/18/2020 FINDINGS: Lungs are mildly hyperinflated. There is perihilar peribronchial thickening, likely chronic. Small bilateral pleural effusions are present. There is associated bibasilar atelectasis. No focal consolidations. IMPRESSION: Small bilateral pleural effusions and bibasilar atelectasis. Chronic prominence of interstitial markings. Electronically Signed   By: Norva Pavlov M.D.   On: 08/20/2020 12:27    Scheduled Meds:  atorvastatin  10 mg Oral QHS   buPROPion  150 mg Oral BID   calcium-vitamin D  1 tablet Oral BID   clonazepam  0.25 mg Oral TID   conjugated estrogens  1 Applicatorful Vaginal Once per day on Mon Wed Fri   docusate sodium  100 mg Oral BID   enoxaparin (LOVENOX) injection  40 mg Subcutaneous Q24H   fluticasone  1 spray Each Nare QHS   furosemide  20 mg Intravenous BID   gabapentin  300 mg Oral QHS   lisinopril  20 mg Oral Daily   loratadine  10 mg Oral Daily   mirtazapine  15 mg Oral QHS   multivitamin with minerals  1 tablet Oral Daily   pantoprazole  40 mg Oral Daily    sertraline  100 mg Oral Daily   sodium chloride  1 spray Each Nare Daily    Continuous Infusions:  sodium chloride 1,000 mL (08/20/20 2037)   ampicillin-sulbactam (UNASYN) IV 3 g (08/21/20 0156)     LOS: 2 days     Hollice Espy, MD [facility]   08/21/2020, 9:16 AM

## 2020-08-21 NOTE — [place] FL2 (Signed)
[place] MEDICAID FL2 LEVEL OF CARE SCREENING TOOL     IDENTIFICATION  Patient Name: Samantha Crane Birthdate: October 05, 1929 Sex: female Admission Date (Current Location): 08/18/2020  [facility] and [place] Number:  Producer, television/film/video and Address:  [facility],  [address] [place], [place] [address]      Provider Number: [id number]  Attending Physician Name and Address:  Hollice Espy, MD  Relative Name and Phone Number:  Derry Lory (daughter) Ph: [phone]    Current Level of Care: Hospital Recommended Level of Care: Skilled Nursing Facility Prior Approval Number:    Date Approved/Denied:   PASRR Number: Pending  Discharge Plan: SNF    Current Diagnoses: Patient Active Problem List   Diagnosis Date Noted   Acute on chronic respiratory failure with hypoxia (HCC) 08/20/2020   Acute diastolic CHF (congestive heart failure) (HCC) 08/20/2020   Sepsis (HCC) 08/19/2020   Cellulitis of left leg 08/18/2020   Hyponatremia 12/18/2016   Incomplete emptying of bladder 11/15/2016   Prolapse of female genital organs 10/21/2016   Acute lower UTI 10/21/2016   Vaginal atrophy 10/21/2016   Right sciatic nerve pain 07/08/2016   Chronic nonseasonal allergic rhinitis due to pollen 02/09/2016   Environmental allergies 02/09/2016   Allergic rhinitis 11/29/2015   Cough 11/29/2015   History of food allergy 11/29/2015   Primary insomnia 06/05/2015   Subacute maxillary sinusitis 01/06/2015   Chronic diarrhea 06/20/2014   Chronic kidney disease (CKD) stage G3a/A1, moderately decreased glomerular filtration rate (GFR) between 45-59 mL/min/1.73 square meter and albuminuria creatinine ratio less than 30 mg/g (HCC) 11/30/2013   Alopecia 03/17/2013   Dermatitis 12/04/2012   B-complex deficiency 11/19/2012   Essential tremor 11/19/2012   Duodenal ulcer with hemorrhage 05/20/2012   Orthostatic tremor 02/19/2012   Carotid stenosis, bilateral 09/17/2011   Stenosis of carotid  artery 09/17/2011   Hyperlipidemia 04/16/2011   Personal history of transient ischemic attack (TIA), and cerebral infarction without residual deficits 03/04/2011   Vitamin D deficiency 09/20/2010   Anxiety 08/08/2010   Essential hypertension 08/08/2010   Irritable bowel syndrome without diarrhea 08/08/2010   Major depressive disorder, single episode 08/08/2010   Spinal stenosis 08/08/2007    Orientation RESPIRATION BLADDER Height & Weight     Self, Time, Situation, Place  O2 (2L/min) Continent Weight: 131 lb (59.4 kg) Height:  5\' 5"  (165.1 cm)  BEHAVIORAL SYMPTOMS/MOOD NEUROLOGICAL BOWEL NUTRITION STATUS      Continent Diet (Heart healthy)  AMBULATORY STATUS COMMUNICATION OF NEEDS Skin   Extensive Assist Verbally Other (Comment) (Cellulitis: left leg)                       Personal Care Assistance Level of Assistance  Bathing, Feeding, Dressing Bathing Assistance: Limited assistance Feeding assistance: Independent Dressing Assistance: Limited assistance     Functional Limitations Info  Sight, Hearing, Speech Sight Info: Adequate Hearing Info: Adequate Speech Info: Adequate    SPECIAL CARE FACTORS FREQUENCY  PT (By licensed PT), OT (By licensed OT)     PT Frequency: 5x's/week OT Frequency: 5x's/week            Contractures Contractures Info: Not present    Additional Factors Info  Code Status, Allergies, Psychotropic Code Status Info: Full Allergies Info: Prednisone, Amlodipine, Amoxicillin, Amoxicillin-pot Clavulanate, Lactose Intolerance (Gi), Limonene, Sulfa Antibiotics Psychotropic Info: Zoloft, Klonopin, Wellbutrin, Neurontin, Remeron         Current Medications (08/21/2020):  This is the current hospital active medication list Current  Facility-Administered Medications  Medication Dose Route Frequency Provider Last Rate Last Admin   0.9 %  sodium chloride infusion   Intravenous PRN Hollice Espy, MD 10 mL/hr at 08/20/20 2037 1,000 mL at 08/20/20  2037   acetaminophen (TYLENOL) tablet 650 mg  650 mg Oral Q6H PRN Margie Ege A, DO   650 mg at 08/19/20 2356   Or   acetaminophen (TYLENOL) suppository 650 mg  650 mg Rectal Q6H PRN Ronaldo Miyamoto, Tyrone A, DO       albuterol (PROVENTIL) (2.5 MG/3ML) 0.083% nebulizer solution 3 mL  3 mL Inhalation Q6H PRN Ronaldo Miyamoto, Tyrone A, DO   3 mL at 08/20/20 1111   Ampicillin-Sulbactam (UNASYN) 3 g in sodium chloride 0.9 % 100 mL IVPB  3 g Intravenous Q6H Pham, Anh P, RPH 200 mL/hr at 08/21/20 1013 3 g at 08/21/20 1013   atorvastatin (LIPITOR) tablet 10 mg  10 mg Oral QHS Kyle, Tyrone A, DO   10 mg at 08/20/20 2204   buPROPion (WELLBUTRIN SR) 12 hr tablet 150 mg  150 mg Oral BID Ronaldo Miyamoto, Tyrone A, DO   150 mg at 08/21/20 1011   calcium carbonate (TUMS - dosed in mg elemental calcium) chewable tablet 1,000 mg  1,000 mg Oral Q4H PRN Ronaldo Miyamoto, Tyrone A, DO       calcium-vitamin D (OSCAL WITH D) 500-200 MG-UNIT per tablet 1 tablet  1 tablet Oral BID Ronaldo Miyamoto, Tyrone A, DO   1 tablet at 08/21/20 1011   clonazepam (KLONOPIN) disintegrating tablet 0.25 mg  0.25 mg Oral TID Margie Ege A, DO   0.25 mg at 08/21/20 1011   clonazepam (KLONOPIN) disintegrating tablet 0.25 mg  0.25 mg Oral BID PRN Margie Ege A, DO       conjugated estrogens (PREMARIN) vaginal cream 1 Applicatorful  1 Applicatorful Vaginal Once per day on Mon Wed Fri Kyle, Tyrone A, DO   1 Applicatorful at 08/18/20 2245   docusate sodium (COLACE) capsule 100 mg  100 mg Oral BID Ronaldo Miyamoto, Tyrone A, DO   100 mg at 08/20/20 2204   enoxaparin (LOVENOX) injection 40 mg  40 mg Subcutaneous Q24H Hollice Espy, MD   40 mg at 08/20/20 1655   fluticasone (FLONASE) 50 MCG/ACT nasal spray 1 spray  1 spray Each Nare QHS Ronaldo Miyamoto, Tyrone A, DO   1 spray at 08/20/20 2205   furosemide (LASIX) injection 20 mg  20 mg Intravenous BID Hollice Espy, MD   20 mg at 08/21/20 1007   gabapentin (NEURONTIN) capsule 300 mg  300 mg Oral QHS Ronaldo Miyamoto, Tyrone A, DO   300 mg at 08/20/20 2204    HYDROcodone-acetaminophen (NORCO/VICODIN) 5-325 MG per tablet 1-2 tablet  1-2 tablet Oral Q4H PRN Ronaldo Miyamoto, Tyrone A, DO       iohexol (OMNIPAQUE) 350 MG/ML injection 100 mL  100 mL Intravenous Once PRN Ronaldo Miyamoto, Tyrone A, DO       lisinopril (ZESTRIL) tablet 20 mg  20 mg Oral Daily Kyle, Tyrone A, DO   20 mg at 08/21/20 1011   loperamide (IMODIUM) capsule 2 mg  2 mg Oral Daily PRN Ronaldo Miyamoto, Tyrone A, DO       loratadine (CLARITIN) tablet 10 mg  10 mg Oral Daily Kyle, Tyrone A, DO   10 mg at 08/21/20 1010   mirtazapine (REMERON) tablet 15 mg  15 mg Oral QHS Kyle, Tyrone A, DO   15 mg at 08/20/20 2204   multivitamin with minerals tablet 1 tablet  1  tablet Oral Daily Ronaldo Miyamoto, Tyrone A, DO   1 tablet at 08/21/20 1010   ondansetron (ZOFRAN) tablet 4 mg  4 mg Oral Q6H PRN Ronaldo Miyamoto, Tyrone A, DO       Or   ondansetron (ZOFRAN) injection 4 mg  4 mg Intravenous Q6H PRN Ronaldo Miyamoto, Tyrone A, DO       ondansetron (ZOFRAN) tablet 8 mg  8 mg Oral Q8H PRN Ronaldo Miyamoto, Tyrone A, DO       pantoprazole (PROTONIX) EC tablet 40 mg  40 mg Oral Daily Kyle, Tyrone A, DO   40 mg at 08/21/20 1010   polyethylene glycol (MIRALAX / GLYCOLAX) packet 17 g  17 g Oral Daily PRN Ronaldo Miyamoto, Tyrone A, DO   17 g at 08/20/20 1646   sertraline (ZOLOFT) tablet 100 mg  100 mg Oral Daily Kyle, Tyrone A, DO   100 mg at 08/21/20 1009   sodium chloride (OCEAN) 0.65 % nasal spray 1 spray  1 spray Each Nare Daily Kyle, Tyrone A, DO   1 spray at 08/21/20 1012   zolpidem (AMBIEN) tablet 5 mg  5 mg Oral QHS PRN Margie Ege A, DO         Discharge Medications: Please see discharge summary for a list of discharge medications.  Relevant Imaging Results:  Relevant Lab Results:   Additional Information SSN: [id number]  Ewing Schlein, LCSW

## 2020-08-21 NOTE — Plan of Care (Signed)
  Problem: Nutrition: Goal: Adequate nutrition will be maintained Outcome: Progressing   Problem: Elimination: Goal: Will not experience complications related to urinary retention Outcome: Progressing   

## 2020-08-21 NOTE — TOC Initial Note (Addendum)
Transition of Care [facility]) - Initial/Assessment Note   Patient Details  Name: Samantha Crane MRN: [id number] Date of Birth: 1929/12/29  Transition of Care [facility]) CM/SW Contact:    Ewing Schlein, LCSW Phone Number: 08/21/2020, 1:44 PM  Clinical Narrative: PT recommendation changed from HHPT to SNF. Patient and family are agreeable to SNF at [facility] as patient lives at [facility]. CSW confirmed with [facility] that a SNF bed will be available later this week.  FL2 done; PASRR pending as additional documentation was needed by [place] MUST. Documents uploaded for review. TOC awaiting PASRR.  AddendumCherlyn Roberts received: [id number].  Expected Discharge Plan: Skilled Nursing Facility Barriers to Discharge: Continued Medical Work up  Patient Goals and CMS Choice Patient states their goals for this hospitalization and ongoing recovery are:: Discharge to [facility] CMS Medicare.gov Compare Post Acute Care list provided to:: Patient Choice offered to / list presented to : Patient  Expected Discharge Plan and Services Expected Discharge Plan: Skilled Nursing Facility In-house Referral: Clinical Social Work Post Acute Care Choice: Skilled Nursing Facility Living arrangements for the past 2 months: Assisted Living Facility ([facility])             DME Arranged: N/A DME Agency: NA  Prior Living Arrangements/Services Living arrangements for the past 2 months: Assisted Living Facility ([facility]) Lives with:: Facility Resident Patient language and need for interpreter reviewed:: Yes Do you feel safe going back to the place where you live?: Yes      Need for Family Participation in Patient Care: Yes (Comment) Care giver support system in place?: Yes (comment) Current home services: DME (Cane, rolling walker) Criminal Activity/Legal Involvement Pertinent to Current Situation/Hospitalization: No - Comment as needed  Activities of Daily Living Home Assistive  Devices/Equipment: Blood pressure cuff, Grab bars around toilet, Grab bars in shower, Hand-held shower hose, Walker (specify type), Scales ADL Screening (condition at time of admission) Patient's cognitive ability adequate to safely complete daily activities?: Yes Is the patient deaf or have difficulty hearing?: No Does the patient have difficulty seeing, even when wearing glasses/contacts?: No Does the patient have difficulty concentrating, remembering, or making decisions?: Yes Patient able to express need for assistance with ADLs?: Yes Does the patient have difficulty dressing or bathing?: Yes Independently performs ADLs?: No Communication: Independent Dressing (OT): Needs assistance Is this a change from baseline?: Pre-admission baseline Grooming: Independent Feeding: Independent Bathing: Needs assistance Is this a change from baseline?: Pre-admission baseline Toileting: Needs assistance Is this a change from baseline?: Pre-admission baseline In/Out Bed: Needs assistance Is this a change from baseline?: Pre-admission baseline Walks in Home: Needs assistance Is this a change from baseline?: Pre-admission baseline Does the patient have difficulty walking or climbing stairs?: Yes (secondary to weakness) Weakness of Legs: Both Weakness of Arms/Hands: None  Permission Sought/Granted Permission sought to share information with : Facility Industrial/product designer granted to share information with : Yes, Verbal Permission Granted Permission granted to share info w AGENCY: [facility]  Emotional Assessment Appearance:: Appears stated age Attitude/Demeanor/Rapport: Engaged Affect (typically observed): Accepting Orientation: : Oriented to Self, Oriented to Place, Oriented to  Time, Oriented to Situation Alcohol / Substance Use: Not Applicable Psych Involvement: No (comment)  Admission diagnosis:  Cellulitis of left leg [L03.116] Cellulitis of left anterior lower leg  [L03.116] Sepsis (HCC) [A41.9] Patient Active Problem List   Diagnosis Date Noted   Acute on chronic respiratory failure with hypoxia (HCC) 08/20/2020   Acute diastolic CHF (congestive heart failure) (HCC) 08/20/2020  Sepsis (HCC) 08/19/2020   Cellulitis of left leg 08/18/2020   Hyponatremia 12/18/2016   Incomplete emptying of bladder 11/15/2016   Prolapse of female genital organs 10/21/2016   Acute lower UTI 10/21/2016   Vaginal atrophy 10/21/2016   Right sciatic nerve pain 07/08/2016   Chronic nonseasonal allergic rhinitis due to pollen 02/09/2016   Environmental allergies 02/09/2016   Allergic rhinitis 11/29/2015   Cough 11/29/2015   History of food allergy 11/29/2015   Primary insomnia 06/05/2015   Subacute maxillary sinusitis 01/06/2015   Chronic diarrhea 06/20/2014   Chronic kidney disease (CKD) stage G3a/A1, moderately decreased glomerular filtration rate (GFR) between 45-59 mL/min/1.73 square meter and albuminuria creatinine ratio less than 30 mg/g (HCC) 11/30/2013   Alopecia 03/17/2013   Dermatitis 12/04/2012   B-complex deficiency 11/19/2012   Essential tremor 11/19/2012   Duodenal ulcer with hemorrhage 05/20/2012   Orthostatic tremor 02/19/2012   Carotid stenosis, bilateral 09/17/2011   Stenosis of carotid artery 09/17/2011   Hyperlipidemia 04/16/2011   Personal history of transient ischemic attack (TIA), and cerebral infarction without residual deficits 03/04/2011   Vitamin D deficiency 09/20/2010   Anxiety 08/08/2010   Essential hypertension 08/08/2010   Irritable bowel syndrome without diarrhea 08/08/2010   Major depressive disorder, single episode 08/08/2010   Spinal stenosis 08/08/2007   PCP:  Nadara Eaton, MD Pharmacy:   [facility] - [place], [place] - [address] [place] [address] Phone: [phone] Fax: [phone]  [facility] - [place], [place] - [address] [place] [address] Phone: [phone] Fax: [phone]  Readmission Risk Interventions No flowsheet data found.

## 2020-08-21 NOTE — Care Management (Signed)
RE: Samantha Crane Date of Birth: 1929/10/05 Date: 08/21/2020 MUST ID: [id number]   To Whom It May Concern:   Please be advised that the above name patient will require a short-term nursing home stay--anticipated 30 days or less rehabilitation and strengthening. The plan is for return home.

## 2020-08-22 ENCOUNTER — Inpatient Hospital Stay (HOSPITAL_COMMUNITY): Payer: Medicare Other

## 2020-08-22 DIAGNOSIS — I5031 Acute diastolic (congestive) heart failure: Secondary | ICD-10-CM

## 2020-08-22 DIAGNOSIS — N390 Urinary tract infection, site not specified: Secondary | ICD-10-CM

## 2020-08-22 LAB — CBC
HCT: 34 % — ABNORMAL LOW (ref 36.0–46.0)
Hemoglobin: 10.9 g/dL — ABNORMAL LOW (ref 12.0–15.0)
MCH: 31 pg (ref 26.0–34.0)
MCHC: 32.1 g/dL (ref 30.0–36.0)
MCV: 96.6 fL (ref 80.0–100.0)
Platelets: 195 10*3/uL (ref 150–400)
RBC: 3.52 MIL/uL — ABNORMAL LOW (ref 3.87–5.11)
RDW: 12.8 % (ref 11.5–15.5)
WBC: 7.1 10*3/uL (ref 4.0–10.5)
nRBC: 0 % (ref 0.0–0.2)

## 2020-08-22 LAB — BASIC METABOLIC PANEL
Anion gap: 10 (ref 5–15)
BUN: 24 mg/dL — ABNORMAL HIGH (ref 8–23)
CO2: 30 mmol/L (ref 22–32)
Calcium: 8.4 mg/dL — ABNORMAL LOW (ref 8.9–10.3)
Chloride: 98 mmol/L (ref 98–111)
Creatinine, Ser: 1.03 mg/dL — ABNORMAL HIGH (ref 0.44–1.00)
GFR, Estimated: 52 mL/min — ABNORMAL LOW (ref >60)
Glucose, Bld: 110 mg/dL — ABNORMAL HIGH (ref 70–99)
Potassium: 3.7 mmol/L (ref 3.5–5.1)
Sodium: 138 mmol/L (ref 135–145)

## 2020-08-22 LAB — PROCALCITONIN: Procalcitonin: 0.98 ng/mL

## 2020-08-22 LAB — SARS CORONAVIRUS 2 (TAT 6-24 HRS): SARS Coronavirus 2: NEGATIVE

## 2020-08-22 LAB — ECHOCARDIOGRAM COMPLETE
Area-P 1/2: 4.49 cm2
Height: 65 in
S' Lateral: 2 cm
Weight: 2096 oz

## 2020-08-22 NOTE — Progress Notes (Signed)
  Echocardiogram 2D Echocardiogram has been performed.  Augustine Radar 08/22/2020, 3:27 PM

## 2020-08-22 NOTE — Progress Notes (Signed)
Physical Therapy Treatment Patient Details Name: Samantha Crane MRN: [id number] DOB: [birth year] Today's Date: 08/22/2020    History of Present Illness Pt admitted with sepsis 2* L LE cellulitis and with hx of CKD, spinal stenosis, T-9 compression fx and "orthostatic tremor".  Now PNA.    PT Comments     Pt progressing slowly and will need ST Rehab at SNF before safely returning to her ALF level. General bed mobility comments: pt more able to self perform with increased time, HOB elevated and use of rail.  Mod c/o fatigue after.  Short rest break.  RA avg 90%. General transfer comment: required increased assist to transfer from elevated bed to [facility] with X 1 posterior LOB from bed.  50% VC's on proper hand placement and safety with turns.  Slow to respond to any balance defict.  Poor self correction reaction.  Also assited with a toilet transfer.  X2 LOB in bathroom (stand to sit to toilet was uncontrolled and standing at sink washing hands)  HIGH FALL RISK. General Gait Details: tolerated an increased distance.  First amb to bathroom then in hallway.  RA avg 90% with 2/4 dyspnea.  Max c/o fatigue after.  Unsteady gait esp with turns and back stepping.  Poor balance reaction responce.  HIGH FALL RISK. Positioned in recliner to comfort with daughter in room.    Follow Up Recommendations  SNF     Equipment Recommendations  None recommended by PT    Recommendations for Other Services       Precautions / Restrictions Precautions Precautions: Fall Precaution Comments: monitor O2    Mobility  Bed Mobility Overal bed mobility: Needs Assistance Bed Mobility: Supine to Sit     Supine to sit: Min assist     General bed mobility comments: pt more able to self perform with increased time, HOB elevated and use of rail.  Mod c/o fatigue after.  Short rest break.  RA avg 90%.    Transfers Overall transfer level: Needs assistance Equipment used: Rolling walker (2 wheeled) Transfers: Sit  to/from [employer] Sit to Stand: Min assist;Mod assist Stand pivot transfers: Mod assist;Max assist       General transfer comment: required increased assist to transfer from elevated bed to [facility] with X 1 posterior LOB from bed.  50% VC's on proper hand placement and safety with turns.  Slow to respond to any balance defict.  Poor self correction reaction.  Also assited with a toilet transfer.  X2 LOB in bathroom (stand to sit to toilet was uncontrolled and standing at sink washing hands)  HIGH FALL RISK.  Ambulation/Gait Ambulation/Gait assistance: Min assist;Mod assist Gait Distance (Feet): 85 Feet Assistive device: Rolling walker (2 wheeled) Gait Pattern/deviations: Step-to pattern;Decreased step length - right;Decreased step length - left;Shuffle;Trunk flexed Gait velocity: decreased   General Gait Details: tolerated an increased distance.  First amb to bathroom then in hallway.  RA avg 90% with 2/4 dyspnea.  Max c/o fatigue after.  Unsteady gait esp with turns and back stepping.  Poor balance reaction responce.  HIGH FALL RISK.   Stairs             Wheelchair Mobility    Modified Rankin (Stroke Patients Only)       Balance  Cognition Arousal/Alertness: Awake/alert Behavior During Therapy: WFL for tasks assessed/performed Overall Cognitive Status: Within Functional Limits for tasks assessed                                 General Comments: AX O x 3 very pleasant lady and cooperative but also with poor insight reguarding her current meical condition and mobility decline.  Max c/o weakness and fatigue after activity.      Exercises      General Comments        Pertinent Vitals/Pain Pain Assessment: No/denies pain    Home Living                      Prior Function            PT Goals (current goals can now be found in the care plan section) Progress  towards PT goals: Progressing toward goals    Frequency    Min 3X/week      PT Plan Discharge plan needs to be updated    Co-evaluation              AM-PAC PT "6 Clicks" Mobility   Outcome Measure  Help needed turning from your back to your side while in a flat bed without using bedrails?: A Little Help needed moving from lying on your back to sitting on the side of a flat bed without using bedrails?: A Little Help needed moving to and from a bed to a chair (including a wheelchair)?: A Lot Help needed standing up from a chair using your arms (e.g., wheelchair or bedside chair)?: A Lot Help needed to walk in hospital room?: A Lot Help needed climbing 3-5 steps with a railing? : Total 6 Click Score: 13    End of Session Equipment Utilized During Treatment: Gait belt Activity Tolerance: Patient limited by fatigue Patient left: in chair;with call bell/phone within reach;with chair alarm set;with family/visitor present Nurse Communication: Mobility status PT Visit Diagnosis: Difficulty in walking, not elsewhere classified (R26.2);Muscle weakness (generalized) (M62.81)     Time: 5809-9833 PT Time Calculation (min) (ACUTE ONLY): 30 min  Charges:  $Gait Training: 8-22 mins $Therapeutic Activity: 8-22 mins                     Felecia Shelling  PTA Acute  Rehabilitation Services Pager      [phone] Office      [phone]

## 2020-08-22 NOTE — Progress Notes (Signed)
Recleaned     PROGRESS NOTE  Samantha Crane CBJ:[id number] DOB: 1929/12/04 DOA: 08/18/2020 PCP: Nadara Eaton, MD  HPI/Recap of past 94 hours: 87 year old female from assisted living with past medical history of hypertension, stage III chronic kidney disease and anxiety/depression injured her left leg after tripping on her walker and cutting her leg a month ago.  The wound was dressed at the time at the facility she resides in, but she did note shaking and chills yesterday.  Seen by her PCP and was noted to have urinary retention which was initially thought to be source of infection when she came to the emergency room.  However urinalysis negative, but on closer examination, found to have left lower extremity cellulitis and then lab work and vitals confirm sepsis.  Patient started on fluids and antibiotics and admitted to the hospitalist service.  By following day, 8/7, patient noted to be hypoxic requiring oxygen with worsening white blood cell count and increasing procalcitonin so patient started on Lasix, oxygen and antibiotics changed to broad-spectrum to cover cellulitis, possible pneumonia and UTI.  Since then, white blood cell count normalized and procalcitonin level continues to improve.  Patient has diuresed over 5 L and continues to feel better.  Oxygen down to 1 L and almost weaned off.  Assessment/Plan: Principal Problem:   Sepsis (HCC) secondary to cellulitis of left leg:, UTI and also possible pneumonia.  Patient meets criteria for sepsis on admission given leukocytosis and tachypnea with cellulitis as source: Antibiotics changed over to Unasyn.  Procalcitonin continues to improve and white count has normalized.  Sepsis resolved.  Active Problems:   Anxiety/depression: Continue home medications.    Chronic kidney disease (CKD) stage G3a/A1, moderately decreased glomerular filtration rate (GFR) between 45-59 mL/min/1.73 square meter and albuminuria creatinine ratio less than 30 mg/g  [facility]): Looks better than baseline.  She may be closer to stage II.  Acute diastolic heart failure: IV Lasix, has responded with diuresis of almost 3 L.  Echocardiogram pending.  Noted mild increase in creatinine and urine output has leveled off so we will discontinue Lasix    Essential hypertension: Blood pressure stable, continue home medications.    Hyperlipidemia: Continue statin.  Chronic UTIs with urinary retention: On suppressive Macrodantin.  Urinalysis notes increased white cells, but nitrite negative, but some results could be off due to suppressive antibiotics.  Have stopped Macrodantin and patient now on Unasyn.  White count improved.  Code Status: Full code  Family Communication: Daughter at the bedside  Disposition Plan: Patient from assisted living, but will need to go to skilled nursing for rehab.  Discharge once infection is better treated, weaned off of oxygen and fully diuresed.  Possibly 8/10.  COVID test has been ordered  Consultants: None  Procedures: None  Antimicrobials: IV vancomycin 8/5-8/7 IV Unasyn 8/7-present  DVT prophylaxis: Lovenox  Level of care: Med-Surg   Objective: Vitals:   08/21/20 2133 08/22/20 0538  BP: (!) 138/46 (!) 135/55  Pulse: 66 69  Resp: 16 15  Temp: 98 F (36.7 C) 98.4 F (36.9 C)  SpO2: 98% 94%    Intake/Output Summary (Last 24 hours) at 08/22/2020 0815 Last data filed at 08/22/2020 0600 Gross per 24 hour  Intake 947.77 ml  Output 2050 ml  Net -1102.23 ml    Filed Weights   08/18/20 1030  Weight: 59.4 kg   Body mass index is 21.8 kg/m.  Exam:  General: Alert and oriented x3, no acute distress Cardiovascular: Regular rate and  rhythm, S1-S2 Respiratory: Decreased breath sounds bibasilar, few bilateral crackles Abdomen: Soft, nontender, nondistended, positive bowel sounds Musculoskeletal: Left leg several inches below the knee and the anterior aspect notes a small 1 cm scabbed over wound.  Erythema fully  resolved Skin: As above, erythema fully resolved Psychiatry: Appropriate, no evidence of psychoses Neurology: No focal deficits   Data Reviewed: CBC: Recent Labs  Lab 08/18/20 0107 08/19/20 0347 08/20/20 0235 08/21/20 0317 08/22/20 0327  WBC 21.8* 16.3* 16.8* 9.9 7.1  NEUTROABS 20.4*  --   --   --   --   HGB 13.1 11.7* 11.8* 11.4* 10.9*  HCT 39.5 37.2 36.2 35.5* 34.0*  MCV 94.5 97.9 96.8 96.5 96.6  PLT 227 169 196 181 195    Basic Metabolic Panel: Recent Labs  Lab 08/18/20 0107 08/19/20 0347 08/20/20 0235 08/21/20 0317 08/22/20 0327  NA 137 133* 135 136 138  K 4.1 3.6 3.9 4.0 3.7  CL 97* 101 102 99 98  CO2 30 26 28 30 30   GLUCOSE 131* 97 134* 127* 110*  BUN 27* 26* 22 18 24*  CREATININE 1.03* 0.99 0.89 0.84 1.03*  CALCIUM 10.0 8.5* 8.5* 8.6* 8.4*    GFR: Estimated Creatinine Clearance: 34 mL/min (A) (by C-G formula based on SCr of 1.03 mg/dL (H)). Liver Function Tests: Recent Labs  Lab 08/18/20 0107 08/19/20 0347  AST 26 24  ALT 19 22  ALKPHOS 87 59  BILITOT 0.5 0.6  PROT 7.3 5.9*  ALBUMIN 4.5 3.3*    No results for input(s): LIPASE, AMYLASE in the last 168 hours. No results for input(s): AMMONIA in the last 168 hours. Coagulation Profile: Recent Labs  Lab 08/18/20 0107  INR 1.0    Cardiac Enzymes: No results for input(s): CKTOTAL, CKMB, CKMBINDEX, TROPONINI in the last 168 hours. BNP (last 3 results) No results for input(s): PROBNP in the last 8760 hours. HbA1C: No results for input(s): HGBA1C in the last 72 hours. CBG: No results for input(s): GLUCAP in the last 168 hours. Lipid Profile: No results for input(s): CHOL, HDL, LDLCALC, TRIG, CHOLHDL, LDLDIRECT in the last 72 hours. Thyroid Function Tests: No results for input(s): TSH, T4TOTAL, FREET4, T3FREE, THYROIDAB in the last 72 hours. Anemia Panel: No results for input(s): VITAMINB12, FOLATE, FERRITIN, TIBC, IRON, RETICCTPCT in the last 72 hours. Urine analysis:    Component Value  Date/Time   COLORURINE YELLOW 08/19/2020 1535   APPEARANCEUR HAZY (A) 08/19/2020 1535   LABSPEC 1.006 08/19/2020 1535   PHURINE 6.0 08/19/2020 1535   GLUCOSEU NEGATIVE 08/19/2020 1535   HGBUR MODERATE (A) 08/19/2020 1535   BILIRUBINUR NEGATIVE 08/19/2020 1535   KETONESUR NEGATIVE 08/19/2020 1535   PROTEINUR NEGATIVE 08/19/2020 1535   NITRITE NEGATIVE 08/19/2020 1535   LEUKOCYTESUR MODERATE (A) 08/19/2020 1535   Sepsis Labs: @LABRCNTIP (procalcitonin:4,lacticidven:4)  ) Recent Results (from the past 240 hour(s))  Urine Culture     Status: None   Collection Time: 08/18/20 12:33 AM   Specimen: In/Out Cath Urine  Result Value Ref Range Status   Specimen Description   Final    IN/OUT CATH URINE Performed at [facility], [address] [place], [place]    Special Requests   Final    NONE Performed at [facility], [address] [place], [place]    Culture   Final    NO GROWTH Performed at [facility], [address], [place], [address] [place]    Report Status 08/19/2020  FINAL  Final  Blood Culture (routine x 2)     Status: None (Preliminary result)   Collection Time: 08/18/20 12:38 AM   Specimen: BLOOD  Result Value Ref Range Status   Specimen Description BLOOD RIGHT ANTECUBITAL  Final   Special Requests AEROBIC BOTTLE ONLY Blood Culture adequate volume  Final   Culture   Final    NO GROWTH 4 DAYS Performed at [facility], [address], [place], [place] [address]    Report Status PENDING  Incomplete  SARS CORONAVIRUS 2 (TAT 6-24 HRS) Nasopharyngeal Nasopharyngeal Swab     Status: None   Collection Time: 08/18/20  4:29 AM   Specimen: Nasopharyngeal Swab  Result Value Ref Range Status   SARS Coronavirus 2 NEGATIVE NEGATIVE Final    Comment: (NOTE) SARS-CoV-2 target nucleic acids are NOT DETECTED.  The SARS-CoV-2 RNA is generally detectable in upper and lower respiratory specimens during the  acute phase of infection. Negative results do not preclude SARS-CoV-2 infection, do not rule out co-infections with other pathogens, and should not be used as the sole basis for treatment or other patient management decisions. Negative results must be combined with clinical observations, patient history, and epidemiological information. The expected result is Negative.  Fact Sheet for Patients: HairSlick.no  Fact Sheet for Healthcare Providers: quierodirigir.com  This test is not yet approved or cleared by the [place] FDA and  has been authorized for detection and/or diagnosis of SARS-CoV-2 by FDA under an Emergency Use Authorization (EUA). This EUA will remain  in effect (meaning this test can be used) for the duration of the COVID-19 declaration under Se ction 564(b)(1) of the Act, 21 U.S.C. section 360bbb-3(b)(1), unless the authorization is terminated or revoked sooner.  Performed at [facility], [address], [place], [place] [address]       Studies: No results found.  Scheduled Meds:  atorvastatin  10 mg Oral QHS   buPROPion  150 mg Oral BID   calcium-vitamin D  1 tablet Oral BID   clonazepam  0.25 mg Oral TID   conjugated estrogens  1 Applicatorful Vaginal Once per day on Mon Wed Fri   docusate sodium  100 mg Oral BID   enoxaparin (LOVENOX) injection  40 mg Subcutaneous Q24H   fluticasone  1 spray Each Nare QHS   gabapentin  300 mg Oral QHS   lisinopril  20 mg Oral Daily   loratadine  10 mg Oral Daily   mirtazapine  15 mg Oral QHS   multivitamin with minerals  1 tablet Oral Daily   pantoprazole  40 mg Oral Daily   sertraline  100 mg Oral Daily   sodium chloride  1 spray Each Nare Daily    Continuous Infusions:  sodium chloride 1,000 mL (08/20/20 2037)   ampicillin-sulbactam (UNASYN) IV 3 g (08/22/20 0307)     LOS: 3 days     Hollice Espy, MD [facility]   08/22/2020,  8:15 AM

## 2020-08-22 NOTE — Care Management Important Message (Signed)
Important Message  Patient Details IM Letter given to the Patient. Name: Zaneta Lightcap MRN: [id number] Date of Birth: 13-Apr-1929   Medicare Important Message Given:  Yes     Caren Macadam 08/22/2020, 12:19 PM

## 2020-08-23 LAB — BASIC METABOLIC PANEL
Anion gap: 9 (ref 5–15)
BUN: 21 mg/dL (ref 8–23)
CO2: 31 mmol/L (ref 22–32)
Calcium: 8.4 mg/dL — ABNORMAL LOW (ref 8.9–10.3)
Chloride: 98 mmol/L (ref 98–111)
Creatinine, Ser: 0.78 mg/dL (ref 0.44–1.00)
GFR, Estimated: >60 mL/min (ref >60)
Glucose, Bld: 101 mg/dL — ABNORMAL HIGH (ref 70–99)
Potassium: 3.7 mmol/L (ref 3.5–5.1)
Sodium: 138 mmol/L (ref 135–145)

## 2020-08-23 LAB — CULTURE, BLOOD (ROUTINE X 2)
Culture: NO GROWTH
Special Requests: ADEQUATE

## 2020-08-23 LAB — PROCALCITONIN: Procalcitonin: 0.46 ng/mL

## 2020-08-23 MED ORDER — CLONAZEPAM 0.5 MG PO TABS: 0.2500 mg | ORAL_TABLET | ORAL | 0 refills | Status: DC

## 2020-08-23 NOTE — Discharge Summary (Signed)
Physician Discharge Summary  [name] BMW:[id number] DOB: Dec 11, 1929 DOA: 08/18/2020  PCP: Nadara Eaton, MD  Admit date: 08/18/2020 Discharge date: 08/23/2020  Admitted From: [facility] Disposition: Skilled nursing facility  Recommendations for Outpatient Follow-up:  Follow up with PCP in 1-2 weeks   Home Health:no  Equipment/Devices: none  Discharge Condition: Stable Code Status:   Code Status: Full Code Diet recommendation:  Diet Order             Diet Heart Room service appropriate? Yes; Fluid consistency: Thin  Diet effective now                    Brief/Interim Summary: 87 year old female from assisted living with past medical history of hypertension, stage IIIa chronic kidney disease and anxiety/depression injured her left leg after tripping on her walker and cutting her leg a month ago.  The wound was dressed at the time at the facility she resides in, but she did note shaking and chills.  She was seen by her PCP and was noted to have urinary retention which was initially thought to be source of infection when she came to the emergency room.  However urinalysis negative, but on closer examination, found to have left lower extremity cellulitis and then lab work and vitals confirmed sepsis-she was started on fluids and antibiotics and admitted to the hospitalist service. By following day, 8/7, patient noted to be hypoxic requiring oxygen with worsening white blood cell count and increasing procalcitonin so patient started on Lasix, oxygen and antibiotics changed to broad-spectrum to cover cellulitis, possible pneumonia and UTI. Since then, white blood cell count normalized and procalcitonin level continues to improve.  Patient has diuresed over 5 L and continues to feel better.  Oxygen down to 1 L and almost weaned off. Overall remains medically stable  Discharge Diagnoses:   Sepsis POA 2/2 cellulitis of the left leg UTI and possible pneumonia: Procalcitonin  downtrending, leukocytosis resolved, hemodynamically stable and sepsis resolved.  Complete course with p.o. antibiotics on discharge, being treated with Unasyn while here. Blood culture negative hypokalemia.. Recent Labs  Lab 08/18/20 0107 08/18/20 2725 08/19/20 0347 08/20/20 0235 08/20/20 1005 08/21/20 0317 08/22/20 0327 08/23/20 0428  WBC 21.8*  --  16.3* 16.8*  --  9.9 7.1  --   LATICACIDVEN 1.2 1.5  --   --   --   --   --   --   PROCALCITON  --   --   --   --  1.92 1.43 0.98 0.46     Chronic kidney disease (CKD) stage G3a/A1: Renal function is stable. Samantha Crane has stage 2 ckd. Recent Labs  Lab 08/19/20 0347 08/20/20 0235 08/21/20 0317 08/22/20 0327 08/23/20 0428  BUN 26* 22 18 24* 21  CREATININE 0.99 0.89 0.84 1.03* 0.78   Chronic UTI with urine retention history can resume after completion of current antibiotics. Essential hypertension: Blood pressures controlled-on home medication Hyperlipidemia: Continue statin Anxiety/depression-stable continue her Klonopin Acute hypoxic respiratory failure with CHF-wound on oxygen during 1 to 2 L, on room air before discharge if unable to wean will pan to d/c on 2  [place] Acute diastolic CHF: Echo shows grade 2 DD.  Off Lasix.  Monitor volume status AND her weight on outpatient basis. Filed Weights   08/18/20 1030  Weight: 59.4 kg   Net IO Since Admission: -2,088.03 mL [08/23/20 0840]    Consults: none  Subjective: Alert awake oriented no acute events overnight.  Afebrile. Discharge Exam:  Vitals:   08/22/20 2034 08/23/20 0553  BP: (!) 150/63 (!) 139/55  Pulse: 73 61  Resp: 18 16  Temp: 98.2 F (36.8 C) (!) 97.5 F (36.4 C)  SpO2: 93% 98%   General: Pt is alert, awake, not in acute distress Cardiovascular: RRR, S1/S2 +, no rubs, no gallops Respiratory: CTA bilaterally, no wheezing, no rhonchi Abdominal: Soft, NT, ND, bowel sounds + Extremities: no edema, no cyanosis  Discharge Instructions  Discharge Instructions      (HEART FAILURE PATIENTS) Call MD:  Anytime you have any of the following symptoms: 1) 3 pound weight gain in 24 hours or 5 pounds in 1 week 2) shortness of breath, with or without a dry hacking cough 3) swelling in the hands, feet or stomach 4) if you have to sleep on extra pillows at night in order to breathe.   Complete by: As directed    Discharge instructions   Complete by: As directed    CBC BMP within 1 week.  Please call call MD or return to ER for similar or worsening recurring problem that brought you to hospital or if any fever,nausea/vomiting,abdominal pain, uncontrolled pain, chest pain,  shortness of breath or any other alarming symptoms.  Please follow-up your doctor as instructed in a week time and call the office for appointment.  Please avoid alcohol, smoking, or any other illicit substance and maintain healthy habits including taking your regular medications as prescribed.  You were cared for by a hospitalist during your hospital stay. If you have any questions about your discharge medications or the care you received while you were in the hospital after you are discharged, you can call the unit and ask to speak with the hospitalist on call if the hospitalist that took care of you is not available.  Once you are discharged, your primary care physician will handle any further medical issues. Please note that NO REFILLS for any discharge medications will be authorized once you are discharged, as it is imperative that you return to your primary care physician (or establish a relationship with a primary care physician if you do not have one) for your aftercare needs so that they can reassess your need for medications and monitor your lab values   Increase activity slowly   Complete by: As directed       Allergies as of 08/23/2020       Reactions   Prednisone    Other reaction(s): Other (See Comments) Manic tendencies   Amlodipine Swelling   Amoxicillin    Amoxicillin-pot  Clavulanate Diarrhea   Tolerated Unasyn 08/2020   Lactose Intolerance (gi)    Limonene Diarrhea   Sulfa Antibiotics         Medication List     STOP taking these medications    cephALEXin 250 MG capsule Commonly known as: KEFLEX       TAKE these medications    acetaminophen 325 MG tablet Commonly known as: TYLENOL Take 650 mg by mouth every 4 (four) hours as needed for mild pain or fever.   albuterol 108 (90 Base) MCG/ACT inhaler Commonly known as: VENTOLIN HFA Inhale 2 puffs into the lungs every 6 (six) hours as needed.   atorvastatin 10 MG tablet Commonly known as: LIPITOR Take 10 mg by mouth daily.   buPROPion 150 MG 12 hr tablet Commonly known as: WELLBUTRIN SR Take 150 mg by mouth 2 (two) times daily.   Calcium 600/Vitamin D3 600-800 MG-UNIT Tabs Generic drug: Calcium Carb-Cholecalciferol Take  1 tablet by mouth 2 (two) times daily.   calcium carbonate 500 MG chewable tablet Commonly known as: TUMS - dosed in mg elemental calcium Chew 1,000 mg by mouth every 4 (four) hours as needed for indigestion or heartburn.   cetirizine 5 MG tablet Commonly known as: ZYRTEC Take 5 mg by mouth daily.   Chloraseptic 6-10 MG lozenge Generic drug: benzocaine-menthol Take 1 lozenge by mouth 3 (three) times daily as needed for sore throat.   clonazePAM 0.5 MG tablet Commonly known as: KLONOPIN Take 0.5 tablets (0.25 mg total) by mouth See admin instructions for 4 doses. 0.25mg  oral three times daily And 0.25mg  oral twice daily as needed for anxiety   DOK 100 MG capsule Generic drug: docusate sodium Take 100 mg by mouth 2 (two) times daily.   ferrous sulfate 325 (65 FE) MG tablet Take 325 mg by mouth daily with breakfast.   fluticasone 50 MCG/ACT nasal spray Commonly known as: FLONASE Place 2 sprays into both nostrils daily as needed for allergies or rhinitis. What changed:  how much to take when to take this   gabapentin 300 MG capsule Commonly known as:  NEURONTIN Take 300 mg by mouth daily.   guaiFENesin 600 MG 12 hr tablet Commonly known as: MUCINEX Take 600 mg by mouth 2 (two) times daily as needed for cough.   lisinopril 20 MG tablet Commonly known as: ZESTRIL Take 20 mg by mouth daily.   loperamide 2 MG tablet Commonly known as: IMODIUM A-D Take 2 mg by mouth daily as needed for diarrhea or loose stools.   mirtazapine 15 MG tablet Commonly known as: REMERON Take 15 mg by mouth at bedtime.   Multi-Vitamins Tabs Take 1 tablet by mouth daily.   mupirocin ointment 2 % Commonly known as: BACTROBAN Apply 1 application topically 2 (two) times daily. To left lower limb   nitrofurantoin (macrocrystal-monohydrate) 100 MG capsule Commonly known as: MACROBID Take 100 mg by mouth 2 (two) times daily.   Ocean Nasal Spray 0.65 % nasal spray Generic drug: sodium chloride Place 1 spray into the nose daily.   ondansetron 4 MG tablet Commonly known as: ZOFRAN Take 8 mg by mouth every 8 (eight) hours as needed for nausea or vomiting.   pantoprazole 40 MG tablet Commonly known as: PROTONIX Take 40 mg by mouth daily.   polyethylene glycol 17 g packet Commonly known as: MIRALAX / GLYCOLAX Take 17 g by mouth daily as needed for moderate constipation.   Premarin vaginal cream Generic drug: conjugated estrogens Place 1 Applicatorful vaginally 3 (three) times a week. Days not Specified   sertraline 50 MG tablet Commonly known as: ZOLOFT Take 100 mg by mouth daily.   valACYclovir 1000 MG tablet Commonly known as: VALTREX Take 1 g by mouth 3 (three) times daily as needed (outbreak).   zaleplon 10 MG capsule Commonly known as: SONATA Take 10 mg by mouth at bedtime as needed for sleep.        Follow-up Information     Nadara Eaton, MD Follow up in 3 day(s).   Specialty: Internal Medicine Contact information: [address] [place] [address] [phone]                Allergies  Allergen Reactions    Prednisone     Other reaction(s): Other (See Comments) Manic tendencies   Amlodipine Swelling   Amoxicillin    Amoxicillin-Pot Clavulanate Diarrhea    Tolerated Unasyn 08/2020   Lactose Intolerance (Gi)  Limonene Diarrhea   Sulfa Antibiotics     The results of significant diagnostics from this hospitalization (including imaging, microbiology, ancillary and laboratory) are listed below for reference.    Microbiology: Recent Results (from the past 240 hour(s))  Urine Culture     Status: None   Collection Time: 08/18/20 12:33 AM   Specimen: In/Out Cath Urine  Result Value Ref Range Status   Specimen Description   Final    IN/OUT CATH URINE Performed at [facility], [address], [place], [place] [address]    Special Requests   Final    NONE Performed at [facility], [address], [place], [place] [address]    Culture   Final    NO GROWTH Performed at [facility], [address] [place], [place] [address]    Report Status 08/19/2020 FINAL  Final  Blood Culture (routine x 2)     Status: None (Preliminary result)   Collection Time: 08/18/20 12:38 AM   Specimen: BLOOD  Result Value Ref Range Status   Specimen Description BLOOD RIGHT ANTECUBITAL  Final   Special Requests AEROBIC BOTTLE ONLY Blood Culture adequate volume  Final   Culture   Final    NO GROWTH 4 DAYS Performed at [facility], [address] [place], [place] [address]    Report Status PENDING  Incomplete  SARS CORONAVIRUS 2 (TAT 6-24 HRS) Nasopharyngeal Nasopharyngeal Swab     Status: None   Collection Time: 08/18/20  4:29 AM   Specimen: Nasopharyngeal Swab  Result Value Ref Range Status   SARS Coronavirus 2 NEGATIVE NEGATIVE Final    Comment: (NOTE) SARS-CoV-2 target nucleic acids are NOT DETECTED.  The SARS-CoV-2 RNA is generally detectable in upper and lower respiratory specimens during the acute phase of infection. Negative results do not  preclude SARS-CoV-2 infection, do not rule out co-infections with other pathogens, and should not be used as the sole basis for treatment or other patient management decisions. Negative results must be combined with clinical observations, patient history, and epidemiological information. The expected result is Negative.  Fact Sheet for Patients: HairSlick.no  Fact Sheet for Healthcare Providers: quierodirigir.com  This test is not yet approved or cleared by the [place] FDA and  has been authorized for detection and/or diagnosis of SARS-CoV-2 by FDA under an Emergency Use Authorization (EUA). This EUA will remain  in effect (meaning this test can be used) for the duration of the COVID-19 declaration under Se ction 564(b)(1) of the Act, 21 U.S.C. section 360bbb-3(b)(1), unless the authorization is terminated or revoked sooner.  Performed at [facility], [address] [place], [place] [address]   SARS CORONAVIRUS 2 (TAT 6-24 HRS) Nasopharyngeal Nasopharyngeal Swab     Status: None   Collection Time: 08/22/20  2:13 PM   Specimen: Nasopharyngeal Swab  Result Value Ref Range Status   SARS Coronavirus 2 NEGATIVE NEGATIVE Final    Comment: (NOTE) SARS-CoV-2 target nucleic acids are NOT DETECTED.  The SARS-CoV-2 RNA is generally detectable in upper and lower respiratory specimens during the acute phase of infection. Negative results do not preclude SARS-CoV-2 infection, do not rule out co-infections with other pathogens, and should not be used as the sole basis for treatment or other patient management decisions. Negative results must be combined with clinical observations, patient history, and epidemiological information. The expected result is Negative.  Fact Sheet for Patients: HairSlick.no  Fact Sheet for Healthcare Providers: quierodirigir.com  This test  is not yet approved or cleared by the [place] and  has been authorized for detection and/or diagnosis of SARS-CoV-2 by FDA under an Emergency Use Authorization (EUA). This EUA will remain  in effect (meaning this test can be used) for the duration of the COVID-19 declaration under Se ction 564(b)(1) of the Act, 21 U.S.C. section 360bbb-3(b)(1), unless the authorization is terminated or revoked sooner.  Performed at [facility], [address], [place], [place] [address]     Procedures/Studies: DG Chest 2 View  Result Date: 08/20/2020 CLINICAL DATA:  Audible wheezing. EXAM: CHEST - 2 VIEW COMPARISON:  08/18/2020 FINDINGS: Lungs are mildly hyperinflated. There is perihilar peribronchial thickening, likely chronic. Small bilateral pleural effusions are present. There is associated bibasilar atelectasis. No focal consolidations. IMPRESSION: Small bilateral pleural effusions and bibasilar atelectasis. Chronic prominence of interstitial markings. Electronically Signed   By: Norva Pavlov M.D.   On: 08/20/2020 12:27   DG Chest Port 1 View  Result Date: 08/18/2020 CLINICAL DATA:  Questionable sepsis. EXAM: PORTABLE CHEST 1 VIEW COMPARISON:  Chest radiograph dated 03/14/2018. FINDINGS: Bibasilar streaky densities may represent atelectasis. Developing infiltrate is not excluded clinical correlation is recommended. No lobar consolidation, pleural effusion or pneumothorax. The cardiac silhouette is within normal limits. Atherosclerotic calcification of the aorta. No acute osseous pathology. IMPRESSION: Bibasilar streaky atelectasis versus less likely developing infiltrate. Electronically Signed   By: Elgie Collard M.D.   On: 08/18/2020 01:04   ECHOCARDIOGRAM COMPLETE  Result Date: 08/22/2020    ECHOCARDIOGRAM REPORT   Patient Name:   Samantha Crane Date of Exam: 08/22/2020 Medical Rec #:  [id number]    Height:       65.0 in Accession #:    [id number]   Weight:       131.0 lb Date of Birth:   [birth year]    BSA:          1.653 m Patient Age:    87 years     BP:           135/55 mmHg Patient Gender: F            HR:           72 bpm. Exam Location:  Inpatient Procedure: 2D Echo, Cardiac Doppler and Color Doppler Indications:    CHF-Acute Diastolic I50.31  History:        Patient has prior history of Echocardiogram examinations, most                 recent 11/10/2017. Risk Factors:Hypertension and Dyslipidemia.  Sonographer:    Eulah Pont RDCS Referring Phys: [id number] SENDIL K [facility] IMPRESSIONS  1. Left ventricular ejection fraction, by estimation, is >75%. The left ventricle has hyperdynamic function. The left ventricle has no regional wall motion abnormalities. Left ventricular diastolic parameters are consistent with Grade II diastolic dysfunction (pseudonormalization).  2. Right ventricular systolic function is normal. The right ventricular size is normal. There is mildly elevated pulmonary artery systolic pressure. The estimated right ventricular systolic pressure is 41.2 mmHg.  3. The mitral valve is normal in structure. Trivial mitral valve regurgitation. No evidence of mitral stenosis.  4. The aortic valve is normal in structure. Aortic valve regurgitation is not visualized. No aortic stenosis is present.  5. The inferior vena cava is normal in size with greater than 50% respiratory variability, suggesting right atrial pressure of 3 mmHg. Comparison(s): No significant change from prior study. Prior images reviewed side by side.  FINDINGS  Left Ventricle: Left ventricular ejection fraction, by estimation, is >75%. The left ventricle has hyperdynamic function. The left ventricle has no regional wall motion abnormalities. The left ventricular internal cavity size was normal in size. There is no left ventricular hypertrophy. Left ventricular diastolic parameters are consistent with Grade II diastolic dysfunction (pseudonormalization). Right Ventricle: The right ventricular size is normal. No increase  in right ventricular wall thickness. Right ventricular systolic function is normal. There is mildly elevated pulmonary artery systolic pressure. The tricuspid regurgitant velocity is 3.09  m/s, and with an assumed right atrial pressure of 3 mmHg, the estimated right ventricular systolic pressure is 41.2 mmHg. Left Atrium: Left atrial size was normal in size. Right Atrium: Right atrial size was normal in size. Pericardium: There is no evidence of pericardial effusion. Mitral Valve: The mitral valve is normal in structure. Trivial mitral valve regurgitation. No evidence of mitral valve stenosis. Tricuspid Valve: The tricuspid valve is normal in structure. Tricuspid valve regurgitation is mild . No evidence of tricuspid stenosis. Aortic Valve: The aortic valve is normal in structure. Aortic valve regurgitation is not visualized. No aortic stenosis is present. Pulmonic Valve: The pulmonic valve was normal in structure. Pulmonic valve regurgitation is not visualized. No evidence of pulmonic stenosis. Aorta: The aortic root is normal in size and structure. Venous: The inferior vena cava is normal in size with greater than 50% respiratory variability, suggesting right atrial pressure of 3 mmHg. IAS/Shunts: No atrial level shunt detected by color flow Doppler.  LEFT VENTRICLE PLAX 2D LVIDd:         3.50 cm  Diastology LVIDs:         2.00 cm  LV e' medial:    7.62 cm/s LV PW:         0.80 cm  LV E/e' medial:  15.5 LV IVS:        0.70 cm  LV e' lateral:   10.80 cm/s LVOT diam:     2.00 cm  LV E/e' lateral: 10.9 LV SV:         86 LV SV Index:   52 LVOT Area:     3.14 cm  RIGHT VENTRICLE RV S prime:     14.30 cm/s TAPSE (M-mode): 1.8 cm LEFT ATRIUM             Index       RIGHT ATRIUM           Index LA diam:        3.10 cm 1.88 cm/m  RA Area:     11.30 cm LA Vol (A2C):   34.0 ml 20.57 ml/m RA Volume:   21.50 ml  13.01 ml/m LA Vol (A4C):   26.9 ml 16.28 ml/m LA Biplane Vol: 30.6 ml 18.52 ml/m  AORTIC VALVE LVOT Vmax:    115.00 cm/s LVOT Vmean:  84.800 cm/s LVOT VTI:    0.273 m  AORTA Ao Root diam: 3.10 cm Ao Asc diam:  2.80 cm MITRAL VALVE                TRICUSPID VALVE MV Area (PHT): 4.49 cm     TR Peak grad:   38.2 mmHg MV Decel Time: 169 msec     TR Vmax:        309.00 cm/s MV E velocity: 118.00 cm/s MV A velocity: 96.30 cm/s   SHUNTS MV E/A ratio:  1.23         Systemic VTI:  0.27 m  Systemic Diam: 2.00 cm Donato Schultz MD Electronically signed by Donato Schultz MD Signature Date/Time: 08/22/2020/3:35:04 PM    Final     Labs: BNP (last 3 results) Recent Labs    08/20/20 1005  BNP 252.2*   Basic Metabolic Panel: Recent Labs  Lab 08/19/20 0347 08/20/20 0235 08/21/20 0317 08/22/20 0327 08/23/20 0428  NA 133* 135 136 138 138  K 3.6 3.9 4.0 3.7 3.7  CL 101 102 99 98 98  CO2 26 28 30 30 31   GLUCOSE 97 134* 127* 110* 101*  BUN 26* 22 18 24* 21  CREATININE 0.99 0.89 0.84 1.03* 0.78  CALCIUM 8.5* 8.5* 8.6* 8.4* 8.4*   Liver Function Tests: Recent Labs  Lab 08/18/20 0107 08/19/20 0347  AST 26 24  ALT 19 22  ALKPHOS 87 59  BILITOT 0.5 0.6  PROT 7.3 5.9*  ALBUMIN 4.5 3.3*   No results for input(s): LIPASE, AMYLASE in the last 168 hours. No results for input(s): AMMONIA in the last 168 hours. CBC: Recent Labs  Lab 08/18/20 0107 08/19/20 0347 08/20/20 0235 08/21/20 0317 08/22/20 0327  WBC 21.8* 16.3* 16.8* 9.9 7.1  NEUTROABS 20.4*  --   --   --   --   HGB 13.1 11.7* 11.8* 11.4* 10.9*  HCT 39.5 37.2 36.2 35.5* 34.0*  MCV 94.5 97.9 96.8 96.5 96.6  PLT 227 169 196 181 195   Cardiac Enzymes: No results for input(s): CKTOTAL, CKMB, CKMBINDEX, TROPONINI in the last 168 hours. BNP: Invalid input(s): POCBNP CBG: No results for input(s): GLUCAP in the last 168 hours. D-Dimer No results for input(s): DDIMER in the last 72 hours. Hgb A1c No results for input(s): HGBA1C in the last 72 hours. Lipid Profile No results for input(s): CHOL, HDL, LDLCALC, TRIG, CHOLHDL,  LDLDIRECT in the last 72 hours. Thyroid function studies No results for input(s): TSH, T4TOTAL, T3FREE, THYROIDAB in the last 72 hours.  Invalid input(s): FREET3 Anemia work up No results for input(s): VITAMINB12, FOLATE, FERRITIN, TIBC, IRON, RETICCTPCT in the last 72 hours. Urinalysis    Component Value Date/Time   COLORURINE YELLOW 08/19/2020 1535   APPEARANCEUR HAZY (A) 08/19/2020 1535   LABSPEC 1.006 08/19/2020 1535   PHURINE 6.0 08/19/2020 1535   GLUCOSEU NEGATIVE 08/19/2020 1535   HGBUR MODERATE (A) 08/19/2020 1535   BILIRUBINUR NEGATIVE 08/19/2020 1535   KETONESUR NEGATIVE 08/19/2020 1535   PROTEINUR NEGATIVE 08/19/2020 1535   NITRITE NEGATIVE 08/19/2020 1535   LEUKOCYTESUR MODERATE (A) 08/19/2020 1535   Sepsis Labs Invalid input(s): PROCALCITONIN,  WBC,  LACTICIDVEN Microbiology Recent Results (from the past 240 hour(s))  Urine Culture     Status: None   Collection Time: 08/18/20 12:33 AM   Specimen: In/Out Cath Urine  Result Value Ref Range Status   Specimen Description   Final    IN/OUT CATH URINE Performed at [facility], [address] [place], [place]    Special Requests   Final    NONE Performed at [facility], [address], [place], [place]    Culture   Final    NO GROWTH Performed at [facility], [address], [place], [place]    Report Status 08/19/2020 FINAL  Final  Blood Culture (routine x 2)     Status: None (Preliminary result)   Collection Time: 08/18/20 12:38 AM   Specimen: BLOOD  Result Value Ref Range Status   Specimen Description BLOOD RIGHT ANTECUBITAL  Final  Special Requests AEROBIC BOTTLE ONLY Blood Culture adequate volume  Final   Culture   Final    NO GROWTH 4 DAYS Performed at [facility], [address], [place], [place] [address]    Report Status PENDING  Incomplete  SARS CORONAVIRUS 2 (TAT 6-24 HRS) Nasopharyngeal Nasopharyngeal Swab      Status: None   Collection Time: 08/18/20  4:29 AM   Specimen: Nasopharyngeal Swab  Result Value Ref Range Status   SARS Coronavirus 2 NEGATIVE NEGATIVE Final    Comment: (NOTE) SARS-CoV-2 target nucleic acids are NOT DETECTED.  The SARS-CoV-2 RNA is generally detectable in upper and lower respiratory specimens during the acute phase of infection. Negative results do not preclude SARS-CoV-2 infection, do not rule out co-infections with other pathogens, and should not be used as the sole basis for treatment or other patient management decisions. Negative results must be combined with clinical observations, patient history, and epidemiological information. The expected result is Negative.  Fact Sheet for Patients: HairSlick.no  Fact Sheet for Healthcare Providers: quierodirigir.com  This test is not yet approved or cleared by the [place] FDA and  has been authorized for detection and/or diagnosis of SARS-CoV-2 by FDA under an Emergency Use Authorization (EUA). This EUA will remain  in effect (meaning this test can be used) for the duration of the COVID-19 declaration under Se ction 564(b)(1) of the Act, 21 U.S.C. section 360bbb-3(b)(1), unless the authorization is terminated or revoked sooner.  Performed at [facility], [address], [place], [place] [address]   SARS CORONAVIRUS 2 (TAT 6-24 HRS) Nasopharyngeal Nasopharyngeal Swab     Status: None   Collection Time: 08/22/20  2:13 PM   Specimen: Nasopharyngeal Swab  Result Value Ref Range Status   SARS Coronavirus 2 NEGATIVE NEGATIVE Final    Comment: (NOTE) SARS-CoV-2 target nucleic acids are NOT DETECTED.  The SARS-CoV-2 RNA is generally detectable in upper and lower respiratory specimens during the acute phase of infection. Negative results do not preclude SARS-CoV-2 infection, do not rule out co-infections with other pathogens, and should not be used as  the sole basis for treatment or other patient management decisions. Negative results must be combined with clinical observations, patient history, and epidemiological information. The expected result is Negative.  Fact Sheet for Patients: HairSlick.no  Fact Sheet for Healthcare Providers: quierodirigir.com  This test is not yet approved or cleared by the [place] FDA and  has been authorized for detection and/or diagnosis of SARS-CoV-2 by FDA under an Emergency Use Authorization (EUA). This EUA will remain  in effect (meaning this test can be used) for the duration of the COVID-19 declaration under Se ction 564(b)(1) of the Act, 21 U.S.C. section 360bbb-3(b)(1), unless the authorization is terminated or revoked sooner.  Performed at [facility], [address] [place], [place] [address]      Time coordinating discharge: 35 minutes  SIGNED: Lanae Boast, MD  [facility] 08/23/2020, 8:40 AM  If 7PM-7AM, please contact night-coverage [email or web address]

## 2020-08-23 NOTE — Progress Notes (Signed)
Discharge instructions placed in packet for [employer]

## 2020-08-23 NOTE — TOC Transition Note (Signed)
Transition of Care [facility] - CM/SW Discharge Note   Patient Details  Name: Samantha Crane MRN: [id number] Date of Birth: December 14, 1929  Transition of Care [facility]) CM/SW Contact:  Amada Jupiter, LCSW Phone Number: 08/23/2020, 10:54 AM   Clinical Narrative:    Pt medically cleared for dc today to SNF bed at [facility].  PTAR called at 10:50am.  RN to call report to [phone].  No further TOC needs.   Final next level of care: Skilled Nursing Facility Barriers to Discharge: Barriers Resolved   Patient Goals and CMS Choice Patient states their goals for this hospitalization and ongoing recovery are:: Discharge to [facility] CMS Medicare.gov Compare Post Acute Care list provided to:: Patient Choice offered to / list presented to : Patient  Discharge Placement              Patient chooses bed at: [facility] Patient to be transferred to facility by: PTAR Name of family member notified: daughter Patient and family notified of of transfer: 08/23/20  Discharge Plan and Services In-house Referral: Clinical Social Work   Post Acute Care Choice: Skilled Nursing Facility          DME Arranged: N/A DME Agency: NA                  Social Determinants of Health (SDOH) Interventions     Readmission Risk Interventions Readmission Risk Prevention Plan 08/22/2020  HRI or Home Care Consult Complete  Social Work Consult for Recovery Care Planning/Counseling Complete  Palliative Care Screening Not Applicable  Some recent data might be hidden

## 2020-09-13 ENCOUNTER — Ambulatory Visit (HOSPITAL_BASED_OUTPATIENT_CLINIC_OR_DEPARTMENT_OTHER): Payer: Medicare Other | Admitting: Cardiovascular Disease

## 2020-09-13 DIAGNOSIS — R54 Age-related physical debility: Secondary | ICD-10-CM | POA: Insufficient documentation

## 2020-09-22 ENCOUNTER — Encounter (HOSPITAL_COMMUNITY): Payer: Self-pay

## 2020-09-22 ENCOUNTER — Observation Stay (HOSPITAL_COMMUNITY)
Admission: EM | Admit: 2020-09-22 | Discharge: 2020-09-23 | Disposition: A | Discharge status: Home / Self Care | Payer: Medicare Other | Attending: Internal Medicine | Admitting: Internal Medicine

## 2020-09-22 ENCOUNTER — Other Ambulatory Visit: Payer: Self-pay

## 2020-09-22 ENCOUNTER — Emergency Department (HOSPITAL_COMMUNITY): Payer: Medicare Other

## 2020-09-22 ENCOUNTER — Ambulatory Visit (HOSPITAL_BASED_OUTPATIENT_CLINIC_OR_DEPARTMENT_OTHER): Payer: Medicare Other | Admitting: Cardiovascular Disease

## 2020-09-22 DIAGNOSIS — I130 Hypertensive heart and chronic kidney disease with heart failure and stage 1 through stage 4 chronic kidney disease, or unspecified chronic kidney disease: Secondary | ICD-10-CM | POA: Diagnosis not present

## 2020-09-22 DIAGNOSIS — R531 Weakness: Secondary | ICD-10-CM | POA: Diagnosis not present

## 2020-09-22 DIAGNOSIS — I10 Essential (primary) hypertension: Secondary | ICD-10-CM | POA: Diagnosis present

## 2020-09-22 DIAGNOSIS — Z79899 Other long term (current) drug therapy: Secondary | ICD-10-CM | POA: Diagnosis not present

## 2020-09-22 DIAGNOSIS — Z87891 Personal history of nicotine dependence: Secondary | ICD-10-CM | POA: Diagnosis not present

## 2020-09-22 DIAGNOSIS — I5032 Chronic diastolic (congestive) heart failure: Secondary | ICD-10-CM | POA: Diagnosis not present

## 2020-09-22 DIAGNOSIS — E871 Hypo-osmolality and hyponatremia: Secondary | ICD-10-CM | POA: Diagnosis present

## 2020-09-22 DIAGNOSIS — N1831 Chronic kidney disease, stage 3a: Secondary | ICD-10-CM | POA: Insufficient documentation

## 2020-09-22 DIAGNOSIS — I5033 Acute on chronic diastolic (congestive) heart failure: Secondary | ICD-10-CM | POA: Diagnosis present

## 2020-09-22 DIAGNOSIS — G459 Transient cerebral ischemic attack, unspecified: Secondary | ICD-10-CM | POA: Diagnosis not present

## 2020-09-22 DIAGNOSIS — R200 Anesthesia of skin: Secondary | ICD-10-CM | POA: Diagnosis present

## 2020-09-22 DIAGNOSIS — F419 Anxiety disorder, unspecified: Secondary | ICD-10-CM | POA: Diagnosis not present

## 2020-09-22 DIAGNOSIS — R29810 Facial weakness: Secondary | ICD-10-CM

## 2020-09-22 DIAGNOSIS — Z20822 Contact with and (suspected) exposure to covid-19: Secondary | ICD-10-CM | POA: Diagnosis not present

## 2020-09-22 DIAGNOSIS — F329 Major depressive disorder, single episode, unspecified: Secondary | ICD-10-CM | POA: Diagnosis present

## 2020-09-22 LAB — CBC WITH DIFFERENTIAL/PLATELET
Abs Immature Granulocytes: 0.02 10*3/uL (ref 0.00–0.07)
Basophils Absolute: 0.1 10*3/uL (ref 0.0–0.1)
Basophils Relative: 1 %
Eosinophils Absolute: 0.3 10*3/uL (ref 0.0–0.5)
Eosinophils Relative: 4 %
HCT: 36.6 % (ref 36.0–46.0)
Hemoglobin: 11.8 g/dL — ABNORMAL LOW (ref 12.0–15.0)
Immature Granulocytes: 0 %
Lymphocytes Relative: 25 %
Lymphs Abs: 1.8 10*3/uL (ref 0.7–4.0)
MCH: 30.5 pg (ref 26.0–34.0)
MCHC: 32.2 g/dL (ref 30.0–36.0)
MCV: 94.6 fL (ref 80.0–100.0)
Monocytes Absolute: 0.7 10*3/uL (ref 0.1–1.0)
Monocytes Relative: 9 %
Neutro Abs: 4.6 10*3/uL (ref 1.7–7.7)
Neutrophils Relative %: 61 %
Platelets: 172 10*3/uL (ref 150–400)
RBC: 3.87 MIL/uL (ref 3.87–5.11)
RDW: 13.1 % (ref 11.5–15.5)
WBC: 7.4 10*3/uL (ref 4.0–10.5)
nRBC: 0 % (ref 0.0–0.2)

## 2020-09-22 LAB — COMPREHENSIVE METABOLIC PANEL
ALT: 14 U/L (ref 0–44)
AST: 19 U/L (ref 15–41)
Albumin: 3.5 g/dL (ref 3.5–5.0)
Alkaline Phosphatase: 74 U/L (ref 38–126)
Anion gap: 10 (ref 5–15)
BUN: 17 mg/dL (ref 8–23)
CO2: 26 mmol/L (ref 22–32)
Calcium: 9.2 mg/dL (ref 8.9–10.3)
Chloride: 97 mmol/L — ABNORMAL LOW (ref 98–111)
Creatinine, Ser: 1.21 mg/dL — ABNORMAL HIGH (ref 0.44–1.00)
GFR, Estimated: 43 mL/min — ABNORMAL LOW (ref >60)
Glucose, Bld: 89 mg/dL (ref 70–99)
Potassium: 4.3 mmol/L (ref 3.5–5.1)
Sodium: 133 mmol/L — ABNORMAL LOW (ref 135–145)
Total Bilirubin: 0.6 mg/dL (ref 0.3–1.2)
Total Protein: 7.2 g/dL (ref 6.5–8.1)

## 2020-09-22 LAB — RESP PANEL BY RT-PCR (FLU A&B, COVID) ARPGX2
Influenza A by PCR: NEGATIVE
Influenza B by PCR: NEGATIVE
SARS Coronavirus 2 by RT PCR: NEGATIVE

## 2020-09-22 LAB — TROPONIN I (HIGH SENSITIVITY)
Troponin I (High Sensitivity): 8 ng/L (ref <18)
Troponin I (High Sensitivity): 5 ng/L (ref <18)

## 2020-09-22 MED ORDER — ATORVASTATIN CALCIUM 10 MG PO TABS
10.0000 mg | ORAL_TABLET | Freq: Every day | ORAL | Status: DC
  Administered 2020-09-23: 10 mg via ORAL
  Filled 2020-09-22: qty 1

## 2020-09-22 MED ORDER — BENZOCAINE-MENTHOL 6-10 MG MT LOZG: 1.0000 | LOZENGE | Freq: Three times a day (TID) | OROMUCOSAL | Status: DC | PRN

## 2020-09-22 MED ORDER — BUPROPION HCL ER (SR) 150 MG PO TB12
150.0000 mg | ORAL_TABLET | Freq: Two times a day (BID) | ORAL | Status: DC
  Administered 2020-09-23: 150 mg via ORAL
  Filled 2020-09-22 (×2): qty 1

## 2020-09-22 MED ORDER — HEPARIN SODIUM (PORCINE) 5000 UNIT/ML IJ SOLN
5000.0000 [IU] | Freq: Three times a day (TID) | INTRAMUSCULAR | Status: DC
  Administered 2020-09-23 (×2): 5000 [IU] via SUBCUTANEOUS
  Filled 2020-09-22 (×2): qty 1

## 2020-09-22 MED ORDER — PANTOPRAZOLE SODIUM 40 MG PO TBEC
40.0000 mg | DELAYED_RELEASE_TABLET | Freq: Every morning | ORAL | Status: DC
  Administered 2020-09-23: 40 mg via ORAL
  Filled 2020-09-22: qty 1

## 2020-09-22 MED ORDER — CLONAZEPAM 0.25 MG PO TBDP
0.2500 mg | ORAL_TABLET | Freq: Three times a day (TID) | ORAL | Status: DC
  Administered 2020-09-23: 0.25 mg via ORAL
  Filled 2020-09-22 (×2): qty 1

## 2020-09-22 MED ORDER — ALBUTEROL SULFATE (2.5 MG/3ML) 0.083% IN NEBU: 3.0000 mL | INHALATION_SOLUTION | Freq: Four times a day (QID) | RESPIRATORY_TRACT | Status: DC | PRN

## 2020-09-22 MED ORDER — ACETAMINOPHEN 650 MG RE SUPP: 650.0000 mg | RECTAL | Status: DC | PRN

## 2020-09-22 MED ORDER — SENNOSIDES-DOCUSATE SODIUM 8.6-50 MG PO TABS: 1.0000 | ORAL_TABLET | Freq: Every evening | ORAL | Status: DC | PRN

## 2020-09-22 MED ORDER — SERTRALINE HCL 100 MG PO TABS
100.0000 mg | ORAL_TABLET | Freq: Every morning | ORAL | Status: DC
  Administered 2020-09-23: 100 mg via ORAL
  Filled 2020-09-22: qty 1

## 2020-09-22 MED ORDER — GABAPENTIN 300 MG PO CAPS
300.0000 mg | ORAL_CAPSULE | Freq: Every morning | ORAL | Status: DC
  Administered 2020-09-23: 300 mg via ORAL
  Filled 2020-09-22: qty 1

## 2020-09-22 MED ORDER — SODIUM CHLORIDE 0.9 % IV SOLN: INTRAVENOUS | Status: DC

## 2020-09-22 MED ORDER — MIRTAZAPINE 15 MG PO TABS
15.0000 mg | ORAL_TABLET | Freq: Every day | ORAL | Status: DC
  Administered 2020-09-23: 15 mg via ORAL
  Filled 2020-09-22 (×2): qty 1

## 2020-09-22 MED ORDER — ACETAMINOPHEN 325 MG PO TABS: 650.0000 mg | ORAL_TABLET | ORAL | Status: DC | PRN

## 2020-09-22 MED ORDER — ACETAMINOPHEN 160 MG/5ML PO SOLN: 650.0000 mg | ORAL | Status: DC | PRN

## 2020-09-22 MED ORDER — GUAIFENESIN ER 600 MG PO TB12: 600.0000 mg | ORAL_TABLET | Freq: Two times a day (BID) | ORAL | Status: DC | PRN

## 2020-09-22 MED ORDER — MELATONIN 3 MG PO TABS
3.0000 mg | ORAL_TABLET | Freq: Every evening | ORAL | Status: DC | PRN
  Administered 2020-09-23: 3 mg via ORAL
  Filled 2020-09-22: qty 1

## 2020-09-22 MED ORDER — ASPIRIN 325 MG PO TABS
325.0000 mg | ORAL_TABLET | Freq: Every day | ORAL | Status: DC
  Administered 2020-09-23 (×2): 325 mg via ORAL
  Filled 2020-09-22 (×2): qty 1

## 2020-09-22 NOTE — Consult Note (Signed)
Neurology Consultation  Reason for Consult: TIA Referring Physician: Dr. Antionette Charpyd  CC: Left facial droop, left-sided weakness  History is obtained from: Patient, chart  HPI: Samantha Crane is a 87 y.o. female past medical history of TIA many years ago-does not remember symptoms, no residual deficits, anxiety, depression, hypertension, hyperlipidemia, orthostatic tremor, vitamin D deficiency, resident of a retirement home requiring considerable help with ADLs, presenting for evaluation of generalized weakness and left-sided weakness. Last known well was before 9 AM today. She was noted to have swerving to the left and EMS were called for generalized weakness and complaints of dropping objects noted left-sided facial droop.  The symptoms had resolved by the time she came to the emergency room around 5 PM.  Her being outside the window for IV tPA precluded activation of code stroke. CT head and MRI of the brain are negative She is admitted for TIA work-up secondary to focal left-sided transient symptoms. Reports considerable gait difficulty over the last few years requiring walker to walk. Was able to do her bills and finances up to 2 years ago but has since required help doing those-although does not report much in terms of memory issues but says that getting out these complex tasks has been difficult over the past couple of years.  Denies fevers chills shortness of breath cough.  Denies chest pain nausea vomiting.  Denies headache  LKW: Sometime before 9 AM on 09/22/2020 tpa given?: no, outside the window Premorbid modified Rankin scale (mRS): 3-4  ROS: Full ROS was performed and is negative except as noted in the HPI.  Past Medical History:  Diagnosis Date   Anxiety    Chronic kidney disease    pt denies, states chronic bladder infections   Depression    Hyperlipidemia    Hypertension    IBS (irritable bowel syndrome)    Orthostatic tremor    Vitamin D deficiency    Family History   Problem Relation Age of Onset   Heart attack Father    Stroke Mother    Stroke Brother    Stroke Paternal Aunt    Allergic rhinitis Neg Hx    Angioedema Neg Hx    Asthma Neg Hx    Immunodeficiency Neg Hx    Eczema Neg Hx    Urticaria Neg Hx      Social History:   reports that she has quit smoking. Her smoking use included cigarettes. She smoked an average of .25 packs per day. She has never used smokeless tobacco. She reports that she does not drink alcohol and does not use drugs.  Medications  Current Facility-Administered Medications:    0.9 %  sodium chloride infusion, , Intravenous, Continuous, Opyd, Lavone Neriimothy S, MD   acetaminophen (TYLENOL) tablet 650 mg, 650 mg, Oral, Q4H PRN **OR** acetaminophen (TYLENOL) 160 MG/5ML solution 650 mg, 650 mg, Per Tube, Q4H PRN **OR** acetaminophen (TYLENOL) suppository 650 mg, 650 mg, Rectal, Q4H PRN, Opyd, Timothy S, MD   albuterol (PROVENTIL) (2.5 MG/3ML) 0.083% nebulizer solution 3 mL, 3 mL, Inhalation, Q6H PRN, Opyd, Lavone Neriimothy S, MD   atorvastatin (LIPITOR) tablet 10 mg, 10 mg, Oral, QHS, Opyd, Lavone Neriimothy S, MD   [START ON 09/23/2020] buPROPion (WELLBUTRIN SR) 12 hr tablet 150 mg, 150 mg, Oral, BID, Opyd, Lavone Neriimothy S, MD   [START ON 09/23/2020] clonazePAM (KLONOPIN) disintegrating tablet 0.25 mg, 0.25 mg, Oral, TID, Opyd, Lavone Neriimothy S, MD   [START ON 09/23/2020] gabapentin (NEURONTIN) capsule 300 mg, 300 mg, Oral, q morning, Opyd, Timothy S,  MD   guaiFENesin (MUCINEX) 12 hr tablet 600 mg, 600 mg, Oral, BID PRN, Opyd, Lavone Neri, MD   heparin injection 5,000 Units, 5,000 Units, Subcutaneous, Q8H, Opyd, Lavone Neri, MD   mirtazapine (REMERON) tablet 15 mg, 15 mg, Oral, QHS, Opyd, Lavone Neri, MD   [START ON 09/23/2020] pantoprazole (PROTONIX) EC tablet 40 mg, 40 mg, Oral, q morning, Opyd, Lavone Neri, MD   senna-docusate (Senokot-S) tablet 1 tablet, 1 tablet, Oral, QHS PRN, Opyd, Lavone Neri, MD   [START ON 09/23/2020] sertraline (ZOLOFT) tablet 100 mg, 100 mg, Oral,  q morning, Opyd, Lavone Neri, MD  Current Outpatient Medications:    acetaminophen (TYLENOL) 325 MG tablet, Take 650 mg by mouth every 4 (four) hours as needed (elevated temperature/generalized discomfort)., Disp: , Rfl:    albuterol (PROVENTIL HFA;VENTOLIN HFA) 108 (90 Base) MCG/ACT inhaler, Inhale 2 puffs into the lungs every 6 (six) hours as needed for wheezing or shortness of breath., Disp: , Rfl:    atorvastatin (LIPITOR) 10 MG tablet, Take 10 mg by mouth at bedtime., Disp: , Rfl:    benzocaine-menthol (CHLORASEPTIC) 6-10 MG lozenge, Take 1 lozenge by mouth 3 (three) times daily as needed for sore throat., Disp: , Rfl:    buPROPion (WELLBUTRIN SR) 150 MG 12 hr tablet, Take 150 mg by mouth 2 (two) times daily., Disp: , Rfl:    CALCIUM 600/VITAMIN D3 600-800 MG-UNIT TABS, Take 1 tablet by mouth 2 (two) times daily., Disp: , Rfl:    calcium carbonate (TUMS - DOSED IN MG ELEMENTAL CALCIUM) 500 MG chewable tablet, Chew 1,000 mg by mouth every 4 (four) hours as needed (gas pain)., Disp: , Rfl:    cetirizine (ZYRTEC) 5 MG tablet, Take 5 mg by mouth every morning., Disp: , Rfl:    clonazePAM (KLONOPIN) 0.5 MG tablet, Take 0.5 tablets (0.25 mg total) by mouth See admin instructions for 4 doses. 0.25mg  oral three times daily And 0.25mg  oral twice daily as needed for anxiety (Patient taking differently: Take 0.25 mg by mouth 3 (three) times daily.), Disp: 4 tablet, Rfl: 0   conjugated estrogens (PREMARIN) vaginal cream, Place 1 applicator vaginally every Monday, Wednesday, and Friday. At bedtime, Disp: , Rfl:    docusate sodium (COLACE) 100 MG capsule, Take 100 mg by mouth 2 (two) times daily., Disp: , Rfl:    ferrous sulfate 325 (65 FE) MG tablet, Take 325 mg by mouth every morning., Disp: , Rfl:    fluticasone (FLONASE) 50 MCG/ACT nasal spray, Place 2 sprays into both nostrils daily as needed for allergies or rhinitis. (Patient taking differently: Place 1 spray into both nostrils at bedtime.), Disp: 16 g,  Rfl: 3   gabapentin (NEURONTIN) 300 MG capsule, Take 300 mg by mouth every morning., Disp: , Rfl:    guaiFENesin (MUCINEX) 600 MG 12 hr tablet, Take 600 mg by mouth 2 (two) times daily as needed for cough., Disp: , Rfl:    lisinopril (PRINIVIL,ZESTRIL) 20 MG tablet, Take 20 mg by mouth every morning., Disp: , Rfl:    loperamide (IMODIUM A-D) 2 MG tablet, Take 2 mg by mouth daily as needed for diarrhea or loose stools., Disp: , Rfl:    mirtazapine (REMERON) 15 MG tablet, Take 15 mg by mouth at bedtime. , Disp: , Rfl:    Multiple Vitamin (MULTIVITAMIN WITH MINERALS) TABS tablet, Take 1 tablet by mouth every morning., Disp: , Rfl:    nitrofurantoin (MACRODANTIN) 100 MG capsule, Take 100 mg by mouth 2 (two) times daily., Disp: ,  Rfl:    ondansetron (ZOFRAN) 4 MG tablet, Take 4-8 mg by mouth See admin instructions. Take one tablet (4 mg) by mouth every 8 hours as needed for nausea/vomiting; if not effective after 2 doses increase to 8 mg., Disp: , Rfl:    pantoprazole (PROTONIX) 40 MG tablet, Take 40 mg by mouth every morning., Disp: , Rfl:    polyethylene glycol (MIRALAX / GLYCOLAX) 17 g packet, Take 17 g by mouth daily as needed (constipation). Stir and dissolve in 4-8 oz gatorade/beverage, Disp: , Rfl:    Probiotic Product (BACID) CAPS, Take 1 capsule by mouth 2 (two) times daily. Bacid with Lactospore 1 billion cell, Disp: , Rfl:    sertraline (ZOLOFT) 100 MG tablet, Take 100 mg by mouth every morning., Disp: , Rfl:    sodium chloride (OCEAN) 0.65 % SOLN nasal spray, Place 1 spray into both nostrils every morning., Disp: , Rfl:    valACYclovir (VALTREX) 1000 MG tablet, Take 1 g by mouth 3 (three) times daily as needed (cold sores)., Disp: , Rfl:    zaleplon (SONATA) 10 MG capsule, Take 10 mg by mouth at bedtime as needed for sleep., Disp: , Rfl:    Exam: Current vital signs: BP 132/77   Pulse 66   Temp 98.2 F (36.8 C) (Oral)   Resp 12   Ht 5\' 5"  (1.651 m)   Wt 55.3 kg   SpO2 96%   BMI  20.30 kg/m  Vital signs in last 24 hours: Temp:  [98.2 F (36.8 C)] 98.2 F (36.8 C) (09/09 1643) Pulse Rate:  [58-66] 66 (09/09 2200) Resp:  [12-18] 12 (09/09 2200) BP: (121-157)/(57-96) 132/77 (09/09 2200) SpO2:  [92 %-98 %] 96 % (09/09 2200) Weight:  [55.3 kg] 55.3 kg (09/09 1644) General: Awake alert in no distress HEENT: Normocephalic/atraumatic Eyes: Clear Cardiovascular: Regular rhythm next abdomen nondistended nontender Extremities warm well perfused Neurological exam Awake alert oriented x3 No dysarthria No aphasia Slightly diminished attention concentration Cranial nerves II to XII intact Motor exam with antigravity extremities-all fours without drift Sensation intact light touch without extinction Coordination with no dysmetria.  There is mild action tremor. Gait testing deferred  NIHSS-0    Labs I have reviewed labs in epic and the results pertinent to this consultation are:   CBC    Component Value Date/Time   WBC 7.4 09/22/2020 1657   RBC 3.87 09/22/2020 1657   HGB 11.8 (L) 09/22/2020 1657   HCT 36.6 09/22/2020 1657   PLT 172 09/22/2020 1657   MCV 94.6 09/22/2020 1657   MCH 30.5 09/22/2020 1657   MCHC 32.2 09/22/2020 1657   RDW 13.1 09/22/2020 1657   LYMPHSABS 1.8 09/22/2020 1657   MONOABS 0.7 09/22/2020 1657   EOSABS 0.3 09/22/2020 1657   BASOSABS 0.1 09/22/2020 1657    CMP     Component Value Date/Time   NA 133 (L) 09/22/2020 1657   K 4.3 09/22/2020 1657   CL 97 (L) 09/22/2020 1657   CO2 26 09/22/2020 1657   GLUCOSE 89 09/22/2020 1657   BUN 17 09/22/2020 1657   CREATININE 1.21 (H) 09/22/2020 1657   CALCIUM 9.2 09/22/2020 1657   PROT 7.2 09/22/2020 1657   ALBUMIN 3.5 09/22/2020 1657   AST 19 09/22/2020 1657   ALT 14 09/22/2020 1657   ALKPHOS 74 09/22/2020 1657   BILITOT 0.6 09/22/2020 1657   GFRNONAA 43 (L) 09/22/2020 1657   GFRAA >60 09/19/2016 2020    Imaging I have reviewed the images  obtained: CT head: Negative for acute  process MRI examination of the brain-negative for acute stroke.  Moderate small vessel disease and atrophy. Chest x-ray unremarkable   Assessment: 87 year old presenting with transient episode of left-sided weakness and left facial droop noted by EMS.  Initial complaints of generalized weakness and dropping things mostly from left side and swerving to the left while sitting or standing. Prior history of TIA-felt this was similar to that but could not tell me exactly what symptoms she had during her prior TIA. Does not know if she is on a antiepileptic at home. Outpatient medication list reviewed does not reveal any antiplatelets.  Symptoms could be consistent with a right hemispheric TIA with transient left-sided symptoms.  MR imaging is negative for acute stroke.  Recommendations: Admit to hospitalist Telemetry Frequent neurochecks CTA head and neck Permissive hypertension-allow for blood pressure to be as high as 220 and treat only on a as needed basis if higher than 220 for the next 48 to 72 hours.  Eventual outpatient goal systolic blood pressure 140/90 or below. A1c Lipid panel 2D echo Start aspirin 325-final recommendation on prophylactic therapy after completion of the work-up above. PT OT Speech therapy N.p.o. until cleared by bedside swallow evaluation Chest x-ray with no evidence of active cardiopulmonary disease.  Check urinalysis to rule out UTI as a cause of current symptoms-?  Recrudescence of old symptoms.  Stroke team will follow with you. Plan communicated to Dr. Antionette Char via secure chat -- Milon Dikes, MD Neurologist [facility] Pager: [phone]

## 2020-09-22 NOTE — ED Notes (Signed)
Patient provided with food and drink with Provider approval. Patient denies any further needs at this time. Stretcher in low and locked position. Side rails up x2. Call bell in reach. Patient verbalizes understanding.   

## 2020-09-22 NOTE — ED Provider Notes (Signed)
[facility] EMERGENCY DEPARTMENT Provider Note   CSN: [id number] Arrival date & time: 09/22/20  1625     History Chief Complaint  Patient presents with   Facial Droop    Left sided     Samantha Crane is a 87 y.o. female.  87 year old female who resides at nursing home with history of depression, hyperlipidemia, IBS, diastolic CHF the ER secondary to difficulty with ambulation, dropping items, questionable numbness, weakness to the left upper extremity, facial droop, generalized malaise since approximately 9 AM this morning. Symptoms have improved/nearly resolved since the onset.  Daughter at bedside was able to assist with history.  Patient does have history of TIA multiple years ago.  Daughter feels the patient is speaking normally at this time.  No slurred speech.  No facial droop.  Patient denies recent medication or dietary changes.  No change in bowel or bladder function.  No chest pain, dyspnea, abd  pain, fevers or chills.  No other acute complaints offered.   The history is provided by the patient. No language interpreter was used.  Cerebrovascular Accident This is a new problem. The current episode started 6 to 12 hours ago. The problem has been resolved. Pertinent negatives include no chest pain, no abdominal pain, no headaches and no shortness of breath.      Past Medical History:  Diagnosis Date   Anxiety    Chronic kidney disease    pt denies, states chronic bladder infections   Depression    Hyperlipidemia    Hypertension    IBS (irritable bowel syndrome)    Orthostatic tremor    Vitamin D deficiency     Patient Active Problem List   Diagnosis Date Noted   Weakness 09/22/2020   TIA (transient ischemic attack)    Acute on chronic respiratory failure with hypoxia (HCC) 08/20/2020   Chronic diastolic CHF (congestive heart failure) (HCC) 08/20/2020   Sepsis (HCC) 08/19/2020   Cellulitis of left leg 08/18/2020   Hyponatremia 12/18/2016    Incomplete emptying of bladder 11/15/2016   Prolapse of female genital organs 10/21/2016   Acute lower UTI 10/21/2016   Vaginal atrophy 10/21/2016   Right sciatic nerve pain 07/08/2016   Chronic nonseasonal allergic rhinitis due to pollen 02/09/2016   Environmental allergies 02/09/2016   Allergic rhinitis 11/29/2015   Cough 11/29/2015   History of food allergy 11/29/2015   Primary insomnia 06/05/2015   Subacute maxillary sinusitis 01/06/2015   Chronic diarrhea 06/20/2014   Chronic kidney disease (CKD) stage G3a/A1, moderately decreased glomerular filtration rate (GFR) between 45-59 mL/min/1.73 square meter and albuminuria creatinine ratio less than 30 mg/g (HCC) 11/30/2013   Alopecia 03/17/2013   Dermatitis 12/04/2012   B-complex deficiency 11/19/2012   Essential tremor 11/19/2012   Duodenal ulcer with hemorrhage 05/20/2012   Orthostatic tremor 02/19/2012   Carotid stenosis, bilateral 09/17/2011   Stenosis of carotid artery 09/17/2011   Hyperlipidemia 04/16/2011   Personal history of transient ischemic attack (TIA), and cerebral infarction without residual deficits 03/04/2011   Vitamin D deficiency 09/20/2010   Anxiety 08/08/2010   Essential hypertension 08/08/2010   Irritable bowel syndrome without diarrhea 08/08/2010   Major depressive disorder, single episode 08/08/2010   Spinal stenosis 08/08/2007    Past Surgical History:  Procedure Laterality Date   lumbar spinal stenosis       OB History   No obstetric history on file.     Family History  Problem Relation Age of Onset   Heart attack Father  Stroke Mother    Stroke Brother    Stroke Paternal Aunt    Allergic rhinitis Neg Hx    Angioedema Neg Hx    Asthma Neg Hx    Immunodeficiency Neg Hx    Eczema Neg Hx    Urticaria Neg Hx     Social History   Tobacco Use   Smoking status: Former    Packs/day: 0.25    Types: Cigarettes   Smokeless tobacco: Never   Tobacco comments:    quit 30 years ago; social  smoker  Vaping Use   Vaping Use: Never used  Substance Use Topics   Alcohol use: No    Alcohol/week: 0.0 standard drinks    Comment: rarely   Drug use: No    Home Medications Prior to Admission medications   Medication Sig Start Date End Date Taking? Authorizing Provider  acetaminophen (TYLENOL) 325 MG tablet Take 650 mg by mouth every 4 (four) hours as needed (elevated temperature/generalized discomfort).   Yes [provider]  albuterol (PROVENTIL HFA;VENTOLIN HFA) 108 (90 Base) MCG/ACT inhaler Inhale 2 puffs into the lungs every 6 (six) hours as needed for wheezing or shortness of breath. 11/29/15  Yes [provider]  atorvastatin (LIPITOR) 10 MG tablet Take 10 mg by mouth at bedtime.   Yes [provider]  benzocaine-menthol (CHLORASEPTIC) 6-10 MG lozenge Take 1 lozenge by mouth 3 (three) times daily as needed for sore throat.   Yes [provider]  buPROPion (WELLBUTRIN SR) 150 MG 12 hr tablet Take 150 mg by mouth 2 (two) times daily. 02/07/19  Yes [provider]  CALCIUM 600/VITAMIN D3 600-800 MG-UNIT TABS Take 1 tablet by mouth 2 (two) times daily. 08/04/20  Yes [provider]  calcium carbonate (TUMS - DOSED IN MG ELEMENTAL CALCIUM) 500 MG chewable tablet Chew 1,000 mg by mouth every 4 (four) hours as needed (gas pain).   Yes [provider]  cetirizine (ZYRTEC) 5 MG tablet Take 5 mg by mouth every morning.   Yes [provider]  clonazePAM (KLONOPIN) 0.5 MG tablet Take 0.5 tablets (0.25 mg total) by mouth See admin instructions for 4 doses. 0.25mg  oral three times daily And 0.25mg  oral twice daily as needed for anxiety Patient taking differently: Take 0.25 mg by mouth 3 (three) times daily. 08/23/20  Yes Lanae Boast, MD  conjugated estrogens (PREMARIN) vaginal cream Place 1 applicator vaginally every Monday, Wednesday, and Friday. At bedtime   Yes [provider]  docusate sodium (COLACE) 100 MG capsule  Take 100 mg by mouth 2 (two) times daily.   Yes [provider]  ferrous sulfate 325 (65 FE) MG tablet Take 325 mg by mouth every morning.   Yes [provider]  fluticasone (FLONASE) 50 MCG/ACT nasal spray Place 2 sprays into both nostrils daily as needed for allergies or rhinitis. Patient taking differently: Place 1 spray into both nostrils at bedtime. 08/13/17  Yes Bobbitt, Heywood Iles, MD  gabapentin (NEURONTIN) 300 MG capsule Take 300 mg by mouth every morning.   Yes [provider]  guaiFENesin (MUCINEX) 600 MG 12 hr tablet Take 600 mg by mouth 2 (two) times daily as needed for cough.   Yes [provider]  lisinopril (PRINIVIL,ZESTRIL) 20 MG tablet Take 20 mg by mouth every morning.   Yes [provider]  loperamide (IMODIUM A-D) 2 MG tablet Take 2 mg by mouth daily as needed for diarrhea or loose stools.   Yes [provider]  mirtazapine (REMERON) 15 MG tablet Take 15 mg by mouth at bedtime.    Yes [provider]  Multiple Vitamin (MULTIVITAMIN WITH MINERALS) TABS tablet Take 1 tablet by mouth every morning.   Yes [provider]  nitrofurantoin (MACRODANTIN) 100 MG capsule Take 100 mg by mouth 2 (two) times daily.   Yes [provider]  ondansetron (ZOFRAN) 4 MG tablet Take 4-8 mg by mouth See admin instructions. Take one tablet (4 mg) by mouth every 8 hours as needed for nausea/vomiting; if not effective after 2 doses increase to 8 mg.   Yes [provider]  pantoprazole (PROTONIX) 40 MG tablet Take 40 mg by mouth every morning.   Yes [provider]  polyethylene glycol (MIRALAX / GLYCOLAX) 17 g packet Take 17 g by mouth daily as needed (constipation). Stir and dissolve in 4-8 oz gatorade/beverage   Yes [provider]  Probiotic Product (BACID) CAPS Take 1 capsule by mouth 2 (two) times daily. Bacid with Lactospore 1 billion cell   Yes [provider]  sertraline (ZOLOFT)  100 MG tablet Take 100 mg by mouth every morning.   Yes [provider]  sodium chloride (OCEAN) 0.65 % SOLN nasal spray Place 1 spray into both nostrils every morning.   Yes [provider]  valACYclovir (VALTREX) 1000 MG tablet Take 1 g by mouth 3 (three) times daily as needed (cold sores). 06/21/16  Yes [provider]  zaleplon (SONATA) 10 MG capsule Take 10 mg by mouth at bedtime as needed for sleep. 12/05/16  Yes [provider]    Allergies    Prednisone, Amlodipine, Amoxicillin, Amoxicillin-pot clavulanate, Lactose intolerance (gi), Limonene, and Sulfa antibiotics  Review of Systems   Review of Systems  Constitutional:  Negative for chills and fever.  HENT:  Negative for facial swelling and trouble swallowing.   Eyes:  Negative for photophobia and visual disturbance.  Respiratory:  Negative for cough and shortness of breath.   Cardiovascular:  Negative for chest pain and palpitations.  Gastrointestinal:  Negative for abdominal pain, nausea and vomiting.  Endocrine: Negative for polydipsia and polyuria.  Genitourinary:  Negative for difficulty urinating and hematuria.  Musculoskeletal:  Positive for gait problem. Negative for joint swelling.  Skin:  Negative for pallor and rash.  Neurological:  Positive for weakness and numbness. Negative for syncope and headaches.  Psychiatric/Behavioral:  Negative for agitation and confusion.    Physical Exam Updated Vital Signs BP 132/77   Pulse 66   Temp 98.2 F (36.8 C) (Oral)   Resp 12   Ht 5\' 5"  (1.651 m)   Wt 55.3 kg   SpO2 96%   BMI 20.30 kg/m   Physical Exam Vitals and nursing note reviewed.  Constitutional:      General: She is not in acute distress.    Appearance: Normal appearance.  HENT:     Head: Normocephalic and atraumatic.     Right Ear: External ear normal.     Left Ear: External ear normal.     Nose: Nose normal.     Mouth/Throat:     Mouth: Mucous membranes are moist.   Eyes:     General: No scleral icterus.       Right eye: No discharge.        Left eye: No discharge.     Extraocular Movements: Extraocular movements intact.     Pupils: Pupils are equal, round, and reactive to light.  Cardiovascular:     Rate  and Rhythm: Normal rate and regular rhythm.     Pulses: Normal pulses.     Heart sounds: Normal heart sounds.  Pulmonary:     Effort: Pulmonary effort is normal. No respiratory distress.     Breath sounds: Normal breath sounds.  Abdominal:     General: Abdomen is flat.     Tenderness: There is no abdominal tenderness.  Musculoskeletal:        General: Normal range of motion.     Cervical back: Normal range of motion.     Right lower leg: No edema.     Left lower leg: No edema.  Skin:    General: Skin is warm and dry.     Capillary Refill: Capillary refill takes less than 2 seconds.  Neurological:     Mental Status: She is alert and oriented to person, place, and time.     GCS: GCS eye subscore is 4. GCS verbal subscore is 5. GCS motor subscore is 6.     Cranial Nerves: Cranial nerves are intact. No facial asymmetry.     Sensory: Sensation is intact.     Motor: Motor function is intact. No tremor.     Coordination: Coordination is intact.     Comments: Peripheral visual fields are intact.  No visual field deficits.  Patient does have some mild word finding difficulty.  Psychiatric:        Mood and Affect: Mood normal.        Behavior: Behavior normal.    ED Results / Procedures / Treatments   Labs (all labs ordered are listed, but only abnormal results are displayed) Labs Reviewed  CBC WITH DIFFERENTIAL/PLATELET - Abnormal; Notable for the following components:      Result Value   Hemoglobin 11.8 (*)    All other components within normal limits  COMPREHENSIVE METABOLIC PANEL - Abnormal; Notable for the following components:   Sodium 133 (*)    Chloride 97 (*)    Creatinine, Ser 1.21 (*)    GFR, Estimated 43 (*)    All other  components within normal limits  RESP PANEL BY RT-PCR (FLU A&B, COVID) ARPGX2  HEMOGLOBIN A1C  LIPID PANEL  BASIC METABOLIC PANEL  CBC  URINALYSIS, ROUTINE W REFLEX MICROSCOPIC  TROPONIN I (HIGH SENSITIVITY)  TROPONIN I (HIGH SENSITIVITY)    EKG EKG Interpretation  Date/Time:  Friday September 22 2020 16:40:24 EDT Ventricular Rate:  66 PR Interval:  152 QRS Duration: 86 QT Interval:  443 QTC Calculation: 465 R Axis:   18 Text Interpretation: Sinus rhythm Left atrial enlargement Similar to prior tracing Confirmed by Tanda Rockers ([id number]) on 09/22/2020 4:44:20 PM  Radiology DG Chest 2 View  Result Date: 09/22/2020 CLINICAL DATA:  Fatigue, facial droop EXAM: CHEST - 2 VIEW COMPARISON:  08/20/2020 FINDINGS: The heart size and mediastinal contours are within normal limits. Previously noted pleural effusions have resolved. No focal pulmonary opacity. The visualized skeletal structures are unremarkable. IMPRESSION: No active cardiopulmonary disease. Electronically Signed   By: Wiliam Ke M.D.   On: 09/22/2020 18:16   CT HEAD WO CONTRAST ( )  Result Date: 09/22/2020 CLINICAL DATA:  TIA.  Left facial droop left arm weakness. EXAM: CT HEAD WITHOUT CONTRAST TECHNIQUE: Contiguous axial images were obtained from the base of the skull through the vertex without intravenous contrast. COMPARISON:  CT head 03/08/2019. FINDINGS: Brain: No evidence of acute infarction, hemorrhage, hydrocephalus, extra-axial collection or mass lesion/mass effect. There is stable mild diffuse atrophy. There is also  stable moderate periventricular white matter hypodensity, likely chronic small vessel ischemic change. Vascular: Atherosclerotic calcifications are present within the cavernous internal carotid arteries. Skull: Normal. Negative for fracture or focal lesion. Sinuses/Orbits: No acute finding. Other: None. IMPRESSION: 1. No acute intracranial process. 2. Stable mild diffuse atrophy and moderate chronic small vessel  ischemic change. Electronically Signed   By: Darliss CheneyAmy  Guttmann M.D.   On: 09/22/2020 18:07   MR BRAIN WO CONTRAST  Result Date: 09/22/2020 CLINICAL DATA:  Transient ischemic attack (TIA) EXAM: MRI HEAD WITHOUT CONTRAST TECHNIQUE: Multiplanar, multiecho pulse sequences of the brain and surrounding structures were obtained without intravenous contrast. COMPARISON:  MRI 09/11/2012.  CT head from the same day. FINDINGS: Brain: No acute infarction, hemorrhage, hydrocephalus, extra-axial collection or mass lesion. Moderate atrophy with ex vacuo ventricular dilation. Moderate patchy T2 hyperintensity within the supratentorial and pontine white matter, nonspecific but compatible with chronic microvascular ischemic disease. Vascular: Major arterial flow voids are maintained at the skull base. Skull and upper cervical spine: Normal marrow signal. Sinuses/Orbits: Clear sinuses.  Unremarkable orbits. Other: No mastoid effusions. IMPRESSION: 1. No evidence of acute intracranial abnormality. 2. Moderate atrophy and chronic microvascular ischemic disease. Electronically Signed   By: Feliberto HartsFrederick S Jones M.D.   On: 09/22/2020 20:24    Procedures Procedures   Medications Ordered in ED Medications  atorvastatin (LIPITOR) tablet 10 mg (has no administration in time range)  buPROPion (WELLBUTRIN SR) 12 hr tablet 150 mg (has no administration in time range)  mirtazapine (REMERON) tablet 15 mg (has no administration in time range)  sertraline (ZOLOFT) tablet 100 mg (has no administration in time range)  pantoprazole (PROTONIX) EC tablet 40 mg (has no administration in time range)  clonazePAM (KLONOPIN) disintegrating tablet 0.25 mg (has no administration in time range)  gabapentin (NEURONTIN) capsule 300 mg (has no administration in time range)  albuterol (PROVENTIL) (2.5 MG/3ML) 0.083% nebulizer solution 3 mL (has no administration in time range)  guaiFENesin (MUCINEX) 12 hr tablet 600 mg (has no administration in time range)   acetaminophen (TYLENOL) tablet 650 mg (has no administration in time range)    Or  acetaminophen (TYLENOL) 160 MG/5ML solution 650 mg (has no administration in time range)    Or  acetaminophen (TYLENOL) suppository 650 mg (has no administration in time range)  senna-docusate (Senokot-S) tablet 1 tablet (has no administration in time range)  heparin injection 5,000 Units (has no administration in time range)  melatonin tablet 3 mg (has no administration in time range)  0.9 %  sodium chloride infusion (has no administration in time range)  aspirin tablet 325 mg (has no administration in time range)    ED Course  I have reviewed the triage vital signs and the nursing notes.  Pertinent labs & imaging results that were available during my care of the patient were reviewed by me and considered in my medical decision making (see chart for details).    MDM Rules/Calculators/A&P                           This patient complains of neurologic complaints, malaise; this involves an extensive number of treatment options and is a complaint that carries with it a high risk of complications and morbidity.  Vital signs reviewed and are stable serious etiologies considered.   Neurologic exam without focal deficit.  There is some very minimal word finding difficulty/aphasia.  No facial droop or sensation deficits. NIH stroke scale of 1  Patient  is outside treatment window for tPA.  Will obtain CT head without IV contrast.  Imaging has been reviewed, CT non-acute. Given non-acute CT will obtain MRI without contrast; this was completed and is also non-acute.  Labs reviewed and are stable.  Pt re-assessed at bedside, she does feel as though her symptoms have resolved entirely. Daughter at bedside also agrees that she is back to her baseline, more interactive than in previous assessment.   At this time recommend admission for TIA, pt is agreeable. D/w admitting team who accepts the patient.  Stable  for admission  Final Clinical Impression(s) / ED Diagnoses Final diagnoses:  TIA (transient ischemic attack)  Facial droop    Rx / DC Orders ED Discharge Orders     None        Sloan Leiter, DO 09/23/20 0001

## 2020-09-22 NOTE — ED Triage Notes (Signed)
BIB EMS from home. C/o left sided facial droop and left arm weakness that is now resolved. EMS stated last known well time was 9 am

## 2020-09-22 NOTE — H&P (Addendum)
History and Physical    Samantha Crane UVO:[id number] DOB: [birth year] DOA: 09/22/2020  PCP: Nadara Eaton, MD   Patient coming from: ALF   Chief Complaint: weakness, fatigue, transient left-sided weakness    HPI: Samantha Crane is a 87 y.o. female with medical history significant for depression, anxiety, insomnia, hypertension, chronic kidney disease stage IIIa, and remote TIA now presenting with weakness, fatigue, and transient left-sided weakness.  Patient had been admitted a month ago with sepsis secondary to cellulitis, was discharged to an SNF, just returned to her ALF yesterday, reports increased fatigue yesterday, and then was having difficulty ambulating and dropping things this morning. EMS reported a left facial droop and left arm weakness that resolved prior to arrival to ED. She uses a walker at baseline, has resting tremor that she reports to be worsening over several months. She denies fall or trauma, denies fever or chills, and denies chest pain or palpitations.   ED Course: Upon arrival to the ED, patient is found to be afebrile with stable vitals. EKG with SR. CXR, head CT, and MRI brain all negative for acute findings. Labs notable for mild hyponatremia and renal insufficiency. Hospitalists were asked to admit for TIA workup.   Review of Systems:  All other systems reviewed and apart from HPI, are negative.  Past Medical History:  Diagnosis Date   Anxiety    Chronic kidney disease    pt denies, states chronic bladder infections   Depression    Hyperlipidemia    Hypertension    IBS (irritable bowel syndrome)    Orthostatic tremor    Vitamin D deficiency     Past Surgical History:  Procedure Laterality Date   lumbar spinal stenosis      Social History:   reports that she has quit smoking. Her smoking use included cigarettes. She smoked an average of .25 packs per day. She has never used smokeless tobacco. She reports that she does not drink alcohol and does not use  drugs.  Allergies  Allergen Reactions   Prednisone Other (See Comments)     Manic tendencies   Amlodipine Swelling   Amoxicillin Other (See Comments)    Unknown reaction   Amoxicillin-Pot Clavulanate Diarrhea    Tolerated Unasyn 08/2020   Lactose Intolerance (Gi) Other (See Comments)    Unknown reaction   Limonene Diarrhea   Sulfa Antibiotics Other (See Comments)    Unknown reaction    Family History  Problem Relation Age of Onset   Heart attack Father    Stroke Mother    Stroke Brother    Stroke Paternal Aunt    Allergic rhinitis Neg Hx    Angioedema Neg Hx    Asthma Neg Hx    Immunodeficiency Neg Hx    Eczema Neg Hx    Urticaria Neg Hx      Prior to Admission medications   Medication Sig Start Date End Date Taking? Authorizing Provider  acetaminophen (TYLENOL) 325 MG tablet Take 650 mg by mouth every 4 (four) hours as needed (elevated temperature/generalized discomfort).   Yes [provider]  albuterol (PROVENTIL HFA;VENTOLIN HFA) 108 (90 Base) MCG/ACT inhaler Inhale 2 puffs into the lungs every 6 (six) hours as needed for wheezing or shortness of breath. 11/29/15  Yes [provider]  atorvastatin (LIPITOR) 10 MG tablet Take 10 mg by mouth at bedtime.   Yes [provider]  benzocaine-menthol (CHLORASEPTIC) 6-10 MG lozenge Take 1 lozenge by mouth 3 (three) times daily as needed  for sore throat.   Yes [provider]  buPROPion (WELLBUTRIN SR) 150 MG 12 hr tablet Take 150 mg by mouth 2 (two) times daily. 02/07/19  Yes [provider]  CALCIUM 600/VITAMIN D3 600-800 MG-UNIT TABS Take 1 tablet by mouth 2 (two) times daily. 08/04/20  Yes [provider]  calcium carbonate (TUMS - DOSED IN MG ELEMENTAL CALCIUM) 500 MG chewable tablet Chew 1,000 mg by mouth every 4 (four) hours as needed (gas pain).   Yes [provider]  cetirizine (ZYRTEC) 5 MG tablet Take 5 mg by mouth every morning.   Yes [provider]   clonazePAM (KLONOPIN) 0.5 MG tablet Take 0.5 tablets (0.25 mg total) by mouth See admin instructions for 4 doses. 0.25mg  oral three times daily And 0.25mg  oral twice daily as needed for anxiety Patient taking differently: Take 0.25 mg by mouth 3 (three) times daily. 08/23/20  Yes Lanae BoastKc, Ramesh, MD  conjugated estrogens (PREMARIN) vaginal cream Place 1 applicator vaginally every Monday, Wednesday, and Friday. At bedtime   Yes [provider]  docusate sodium (COLACE) 100 MG capsule Take 100 mg by mouth 2 (two) times daily.   Yes [provider]  ferrous sulfate 325 (65 FE) MG tablet Take 325 mg by mouth every morning.   Yes [provider]  fluticasone (FLONASE) 50 MCG/ACT nasal spray Place 2 sprays into both nostrils daily as needed for allergies or rhinitis. Patient taking differently: Place 1 spray into both nostrils at bedtime. 08/13/17  Yes Bobbitt, Heywood Ilesalph Carter, MD  gabapentin (NEURONTIN) 300 MG capsule Take 300 mg by mouth every morning.   Yes [provider]  guaiFENesin (MUCINEX) 600 MG 12 hr tablet Take 600 mg by mouth 2 (two) times daily as needed for cough.   Yes [provider]  lisinopril (PRINIVIL,ZESTRIL) 20 MG tablet Take 20 mg by mouth every morning.   Yes [provider]  loperamide (IMODIUM A-D) 2 MG tablet Take 2 mg by mouth daily as needed for diarrhea or loose stools.   Yes [provider]  mirtazapine (REMERON) 15 MG tablet Take 15 mg by mouth at bedtime.    Yes [provider]  Multiple Vitamin (MULTIVITAMIN WITH MINERALS) TABS tablet Take 1 tablet by mouth every morning.   Yes [provider]  nitrofurantoin (MACRODANTIN) 100 MG capsule Take 100 mg by mouth 2 (two) times daily.   Yes [provider]  ondansetron (ZOFRAN) 4 MG tablet Take 4-8 mg by mouth See admin instructions. Take one tablet (4 mg) by mouth every 8 hours as needed for nausea/vomiting; if not effective after 2 doses increase  to 8 mg.   Yes [provider]  pantoprazole (PROTONIX) 40 MG tablet Take 40 mg by mouth every morning.   Yes [provider]  polyethylene glycol (MIRALAX / GLYCOLAX) 17 g packet Take 17 g by mouth daily as needed (constipation). Stir and dissolve in 4-8 oz gatorade/beverage   Yes [provider]  Probiotic Product (BACID) CAPS Take 1 capsule by mouth 2 (two) times daily. Bacid with Lactospore 1 billion cell   Yes [provider]  sertraline (ZOLOFT) 100 MG tablet Take 100 mg by mouth every morning.   Yes [provider]  sodium chloride (OCEAN) 0.65 % SOLN nasal spray Place 1 spray into both nostrils every morning.   Yes [provider]  valACYclovir (VALTREX) 1000 MG tablet Take 1 g by mouth 3 (three) times daily as needed (cold sores). 06/21/16  Yes [provider]  zaleplon (SONATA) 10 MG capsule Take 10 mg by mouth at bedtime as needed for sleep. 12/05/16  Yes [provider]    Physical Exam: Vitals:   09/22/20 1843 09/22/20 1913 09/22/20 2143 09/22/20 2200  BP: (!) 121/96 (!) 148/59 (!) 152/61 132/77  Pulse: 61 61 (!) 58 66  Resp: Temp:      TempSrc:      SpO2: 96% 92% 93% 96%  Weight:      Height:         Constitutional: NAD, calm  Eyes: PERTLA, lids and conjunctivae normal ENMT: Mucous membranes are moist. Posterior pharynx clear of any exudate or lesions.   Neck: normal, supple, no masses, no thyromegaly Respiratory: clear to auscultation bilaterally, no wheezing, no crackles. No accessory muscle use.  Cardiovascular: S1 & S2 heard, regular rate and rhythm. No extremity edema. No significant JVD. Abdomen: No distension, no tenderness, soft. Bowel sounds active.  Musculoskeletal: no clubbing / cyanosis. No joint deformity upper and lower extremities.   Skin: no significant rashes, lesions, ulcers. Warm, dry, well-perfused. Neurologic: CN 2-12 grossly intact. Sensation intact, DTR normal.  Strength 5/5 in all 4 limbs.  Psychiatric: Alert and oriented to person, place, and situation. Pleasant and cooperative.    Labs and Imaging on Admission: I have personally reviewed following labs and imaging studies  CBC: Recent Labs  Lab 09/22/20 1657  WBC 7.4  NEUTROABS 4.6  HGB 11.8*  HCT 36.6  MCV 94.6  PLT 172   Basic Metabolic Panel: Recent Labs  Lab 09/22/20 1657  NA 133*  K 4.3  CL 97*  CO2 26  GLUCOSE 89  BUN 17  CREATININE 1.21*  CALCIUM 9.2   GFR: Estimated Creatinine Clearance: 28.1 mL/min (A) (by C-G formula based on SCr of 1.21 mg/dL (H)). Liver Function Tests: Recent Labs  Lab 09/22/20 1657  AST 19  ALT 14  ALKPHOS 74  BILITOT 0.6  PROT 7.2  ALBUMIN 3.5   No results for input(s): LIPASE, AMYLASE in the last 168 hours. No results for input(s): AMMONIA in the last 168 hours. Coagulation Profile: No results for input(s): INR, PROTIME in the last 168 hours. Cardiac Enzymes: No results for input(s): CKTOTAL, CKMB, CKMBINDEX, TROPONINI in the last 168 hours. BNP (last 3 results) No results for input(s): PROBNP in the last 8760 hours. HbA1C: No results for input(s): HGBA1C in the last 72 hours. CBG: No results for input(s): GLUCAP in the last 168 hours. Lipid Profile: No results for input(s): CHOL, HDL, LDLCALC, TRIG, CHOLHDL, LDLDIRECT in the last 72 hours. Thyroid Function Tests: No results for input(s): TSH, T4TOTAL, FREET4, T3FREE, THYROIDAB in the last 72 hours. Anemia Panel: No results for input(s): VITAMINB12, FOLATE, FERRITIN, TIBC, IRON, RETICCTPCT in the last 72 hours. Urine analysis:    Component Value Date/Time   COLORURINE YELLOW 08/19/2020 1535   APPEARANCEUR HAZY (A) 08/19/2020 1535   LABSPEC 1.006 08/19/2020 1535   PHURINE 6.0 08/19/2020 1535   GLUCOSEU NEGATIVE 08/19/2020 1535   HGBUR MODERATE (A) 08/19/2020 1535   BILIRUBINUR NEGATIVE 08/19/2020 1535   KETONESUR NEGATIVE 08/19/2020 1535   PROTEINUR NEGATIVE 08/19/2020  1535   NITRITE NEGATIVE 08/19/2020 1535   LEUKOCYTESUR MODERATE (A) 08/19/2020 1535   Sepsis Labs: (procalcitonin:4,lacticidven:4) ) Recent Results (from the past 240 hour(s))  Resp Panel by RT-PCR (Flu A&B, Covid) Nasopharyngeal Swab     Status: None   Collection Time: 09/22/20  5:55 PM  Specimen: Nasopharyngeal Swab; Nasopharyngeal(NP) swabs in vial transport medium  Result Value Ref Range Status   SARS Coronavirus 2 by RT PCR NEGATIVE NEGATIVE Final    Comment: (NOTE) SARS-CoV-2 target nucleic acids are NOT DETECTED.  The SARS-CoV-2 RNA is generally detectable in upper respiratory specimens during the acute phase of infection. The lowest concentration of SARS-CoV-2 viral copies this assay can detect is 138 copies/mL. A negative result does not preclude SARS-Cov-2 infection and should not be used as the sole basis for treatment or other patient management decisions. A negative result may occur with  improper specimen collection/handling, submission of specimen other than nasopharyngeal swab, presence of viral mutation(s) within the areas targeted by this assay, and inadequate number of viral copies(<138 copies/mL). A negative result must be combined with clinical observations, patient history, and epidemiological information. The expected result is Negative.  Fact Sheet for Patients:  BloggerCourse.com  Fact Sheet for Healthcare Providers:  SeriousBroker.it  This test is no t yet approved or cleared by the [place] FDA and  has been authorized for detection and/or diagnosis of SARS-CoV-2 by FDA under an Emergency Use Authorization (EUA). This EUA will remain  in effect (meaning this test can be used) for the duration of the COVID-19 declaration under Section 564(b)(1) of the Act, 21 U.S.C.section 360bbb-3(b)(1), unless the authorization is terminated  or revoked sooner.       Influenza A by PCR NEGATIVE  NEGATIVE Final   Influenza B by PCR NEGATIVE NEGATIVE Final    Comment: (NOTE) The Xpert Xpress SARS-CoV-2/FLU/RSV plus assay is intended as an aid in the diagnosis of influenza from Nasopharyngeal swab specimens and should not be used as a sole basis for treatment. Nasal washings and aspirates are unacceptable for Xpert Xpress SARS-CoV-2/FLU/RSV testing.  Fact Sheet for Patients: BloggerCourse.com  Fact Sheet for Healthcare Providers: SeriousBroker.it  This test is not yet approved or cleared by the [place] FDA and has been authorized for detection and/or diagnosis of SARS-CoV-2 by FDA under an Emergency Use Authorization (EUA). This EUA will remain in effect (meaning this test can be used) for the duration of the COVID-19 declaration under Section 564(b)(1) of the Act, 21 U.S.C. section 360bbb-3(b)(1), unless the authorization is terminated or revoked.  Performed at [facility], [address], [place], [place] [address]      Radiological Exams on Admission: DG Chest 2 View  Result Date: 09/22/2020 CLINICAL DATA:  Fatigue, facial droop EXAM: CHEST - 2 VIEW COMPARISON:  08/20/2020 FINDINGS: The heart size and mediastinal contours are within normal limits. Previously noted pleural effusions have resolved. No focal pulmonary opacity. The visualized skeletal structures are unremarkable. IMPRESSION: No active cardiopulmonary disease. Electronically Signed   By: Wiliam Ke M.D.   On: 09/22/2020 18:16   CT HEAD WO CONTRAST ( )  Result Date: 09/22/2020 CLINICAL DATA:  TIA.  Left facial droop left arm weakness. EXAM: CT HEAD WITHOUT CONTRAST TECHNIQUE: Contiguous axial images were obtained from the base of the skull through the vertex without intravenous contrast. COMPARISON:  CT head 03/08/2019. FINDINGS: Brain: No evidence of acute infarction, hemorrhage, hydrocephalus, extra-axial collection or mass lesion/mass effect.  There is stable mild diffuse atrophy. There is also stable moderate periventricular white matter hypodensity, likely chronic small vessel ischemic change. Vascular: Atherosclerotic calcifications are present within the cavernous internal carotid arteries. Skull: Normal. Negative for fracture or focal lesion. Sinuses/Orbits: No acute finding. Other: None. IMPRESSION: 1. No acute intracranial process. 2. Stable mild diffuse atrophy and  moderate chronic small vessel ischemic change. Electronically Signed   By: Darliss Cheney M.D.   On: 09/22/2020 18:07   MR BRAIN WO CONTRAST  Result Date: 09/22/2020 CLINICAL DATA:  Transient ischemic attack (TIA) EXAM: MRI HEAD WITHOUT CONTRAST TECHNIQUE: Multiplanar, multiecho pulse sequences of the brain and surrounding structures were obtained without intravenous contrast. COMPARISON:  MRI 09/11/2012.  CT head from the same day. FINDINGS: Brain: No acute infarction, hemorrhage, hydrocephalus, extra-axial collection or mass lesion. Moderate atrophy with ex vacuo ventricular dilation. Moderate patchy T2 hyperintensity within the supratentorial and pontine white matter, nonspecific but compatible with chronic microvascular ischemic disease. Vascular: Major arterial flow voids are maintained at the skull base. Skull and upper cervical spine: Normal marrow signal. Sinuses/Orbits: Clear sinuses.  Unremarkable orbits. Other: No mastoid effusions. IMPRESSION: 1. No evidence of acute intracranial abnormality. 2. Moderate atrophy and chronic microvascular ischemic disease. Electronically Signed   By: Feliberto Harts M.D.   On: 09/22/2020 20:24    EKG: Independently reviewed. Sinus rhythm,   Assessment/Plan   1. Transient left-sided weakness  - Patient reports fatigue yesterday then difficulty ambulating this morning, was reportedly dropping things, then had transient left facial droop and left arm weakness reported by EMS  - Daughter was in ED and felt that patient had returned to  baseline  - No acute findings on MRI brain   - Appreciate neurology consultation, plan for frequent neuro checks, PT/OT/SLP assessments, NPO pending swallow screen, echo, CTA head and neck, permissive HTN, A1c, lipids, and start ASA    2. CKD 3a  - SCr is 1.21 on admission, up from 0.78 a month ago  - She appears hypovolemic in ED, plan to hold lisinopril, hydrate with NS infusion before and after CT contrast, and monitor    3. Depression, anxiety  - Continue home regimen, consider reducing Wellbutrin dose if renal function does not improve    4. Hyponatremia  - Serum sodium 133 on admission in setting of hypovolemia  - Hydrate with NS, monitor   5. Chronic diastolic CHF  - Appears hypovolemic on admission  - Hydrate overnight, monitor volume status    6. Hypertension  - Hold lisinopril initially    DVT prophylaxis: sq heparin  Code Status: Full, confirmed on admission   Level of Care: Level of care: Telemetry Medical Family Communication: Daughter updated by phone  Disposition Plan:  Patient is from: ALF  Anticipated d/c is to: TBD Anticipated d/c date is: 9/10 or 09/24/20  Patient currently: Pending TIA workup  Consults called: neurology  Admission status: Observation     Briscoe Deutscher, MD [facility]  09/22/2020, 11:09 PM

## 2020-09-23 ENCOUNTER — Observation Stay (HOSPITAL_BASED_OUTPATIENT_CLINIC_OR_DEPARTMENT_OTHER): Payer: Medicare Other

## 2020-09-23 ENCOUNTER — Observation Stay (HOSPITAL_COMMUNITY): Payer: Medicare Other

## 2020-09-23 DIAGNOSIS — G459 Transient cerebral ischemic attack, unspecified: Secondary | ICD-10-CM

## 2020-09-23 DIAGNOSIS — R531 Weakness: Secondary | ICD-10-CM | POA: Diagnosis not present

## 2020-09-23 LAB — BASIC METABOLIC PANEL
Anion gap: 8 (ref 5–15)
BUN: 15 mg/dL (ref 8–23)
CO2: 26 mmol/L (ref 22–32)
Calcium: 8.6 mg/dL — ABNORMAL LOW (ref 8.9–10.3)
Chloride: 102 mmol/L (ref 98–111)
Creatinine, Ser: 1.07 mg/dL — ABNORMAL HIGH (ref 0.44–1.00)
GFR, Estimated: 50 mL/min — ABNORMAL LOW (ref >60)
Glucose, Bld: 93 mg/dL (ref 70–99)
Potassium: 4.5 mmol/L (ref 3.5–5.1)
Sodium: 136 mmol/L (ref 135–145)

## 2020-09-23 LAB — CBC
HCT: 36.3 % (ref 36.0–46.0)
Hemoglobin: 11.9 g/dL — ABNORMAL LOW (ref 12.0–15.0)
MCH: 30.4 pg (ref 26.0–34.0)
MCHC: 32.8 g/dL (ref 30.0–36.0)
MCV: 92.6 fL (ref 80.0–100.0)
Platelets: 184 10*3/uL (ref 150–400)
RBC: 3.92 MIL/uL (ref 3.87–5.11)
RDW: 13.2 % (ref 11.5–15.5)
WBC: 5.9 10*3/uL (ref 4.0–10.5)
nRBC: 0 % (ref 0.0–0.2)

## 2020-09-23 LAB — URINALYSIS, ROUTINE W REFLEX MICROSCOPIC
Bilirubin Urine: NEGATIVE
Glucose, UA: NEGATIVE mg/dL
Hgb urine dipstick: NEGATIVE
Ketones, ur: NEGATIVE mg/dL
Leukocytes,Ua: NEGATIVE
Nitrite: NEGATIVE
Protein, ur: NEGATIVE mg/dL
Specific Gravity, Urine: <1.005 — ABNORMAL LOW (ref 1.005–1.030)
pH: 6.5 (ref 5.0–8.0)

## 2020-09-23 LAB — ECHOCARDIOGRAM LIMITED
Area-P 1/2: 2.91 cm2
Height: 65 in
S' Lateral: 2.7 cm
Weight: 1952 oz

## 2020-09-23 LAB — LIPID PANEL
Cholesterol: 178 mg/dL (ref 0–200)
HDL: 64 mg/dL (ref >40)
LDL Cholesterol: 99 mg/dL (ref 0–99)
Total CHOL/HDL Ratio: 2.8 RATIO
Triglycerides: 76 mg/dL (ref <150)
VLDL: 15 mg/dL (ref 0–40)

## 2020-09-23 LAB — HEMOGLOBIN A1C
Hgb A1c MFr Bld: 5.3 % (ref 4.8–5.6)
Mean Plasma Glucose: 105.41 mg/dL

## 2020-09-23 MED ORDER — ATORVASTATIN CALCIUM 40 MG PO TABS: 40.0000 mg | ORAL_TABLET | Freq: Every day | ORAL | 0 refills | Status: DC

## 2020-09-23 MED ORDER — ATORVASTATIN CALCIUM 40 MG PO TABS: 40.0000 mg | ORAL_TABLET | Freq: Every day | ORAL | Status: DC

## 2020-09-23 MED ORDER — ASPIRIN 325 MG PO TABS: 325.0000 mg | ORAL_TABLET | Freq: Every day | ORAL | 0 refills | Status: AC

## 2020-09-23 MED ORDER — IOHEXOL 350 MG/ML SOLN
75.0000 mL | Freq: Once | INTRAVENOUS | Status: AC | PRN
  Administered 2020-09-23: 75 mL via INTRAVENOUS

## 2020-09-23 MED ORDER — ATORVASTATIN CALCIUM 40 MG PO TABS: 40.0000 mg | ORAL_TABLET | Freq: Every day | ORAL | 0 refills | Status: AC

## 2020-09-23 MED ORDER — ASPIRIN 325 MG PO TABS: 325.0000 mg | ORAL_TABLET | Freq: Every day | ORAL | 0 refills | Status: DC

## 2020-09-23 NOTE — Progress Notes (Signed)
  Echocardiogram 2D Echocardiogram has been performed.  Samantha Crane 09/23/2020, 10:24 AM

## 2020-09-23 NOTE — Evaluation (Signed)
Physical Therapy Evaluation Patient Details Name: Samantha Crane MRN: [id number] DOB: 31-Aug-1929 Today's Date: 09/23/2020   History of Present Illness  (P) Pt admitted with sepsis 2* L LE cellulitis and with hx of CKD, spinal stenosis, T-9 compression fx and "orthostatic tremor".  Now PNA.  Clinical Impression  Pt presents to PT with deficits in strength, power, gait, balance, endurance, cognition. Pt awoken by PT and groggy initially during session, requiring cues for technique and some physical assistance for initial bouts of mobility. With further transfer and gait attempts mobility quality improves, with assistance only for safety. Pt is adamant about returning home to ALF, reporting increased assistance available during the day as needed. Pt will benefit from continued aggressive mobilization to improve mobility quality and reduce falls risk. PT recommends return to ALF with PT services.    Follow Up Recommendations Home health PT;Supervision for mobility/OOB (return to ALF with HHPT)    Equipment Recommendations  None recommended by PT    Recommendations for Other Services       Precautions / Restrictions Precautions Precautions: (P) None Restrictions Weight Bearing Restrictions: (P) No      Mobility  Bed Mobility Overal bed mobility: Needs Assistance Bed Mobility: Rolling;Sidelying to Sit Rolling: Min guard Sidelying to sit: Min assist       General bed mobility comments: verbal cues for technique and assistance to elevate trunk into sitting position    Transfers Overall transfer level: Needs assistance Equipment used: 4-wheeled walker Transfers: Sit to/from Stand Sit to Stand: Min assist;Min guard         General transfer comment: minA for initial transfer from bed, minG with verbal cues for anterior trunk lean on second transfer  Ambulation/Gait Ambulation/Gait assistance: Min guard Gait Distance (Feet): 30 Feet (additional trial of 15') Assistive device:  4-wheeled walker Gait Pattern/deviations: Step-through pattern Gait velocity: reduced Gait velocity interpretation: <1.8 ft/sec, indicate of risk for recurrent falls General Gait Details: pt with slowed step-through gait with reduced step length  Stairs            Wheelchair Mobility    Modified Rankin (Stroke Patients Only) Modified Rankin (Stroke Patients Only) Pre-Morbid Rankin Score: Moderate disability Modified Rankin: Moderately severe disability     Balance Overall balance assessment: Needs assistance Sitting-balance support: Single extremity supported;Feet supported Sitting balance-Leahy Scale: Poor Sitting balance - Comments: reliant on UE support of bed Postural control: Posterior lean Standing balance support: Bilateral upper extremity supported Standing balance-Leahy Scale: Poor Standing balance comment: reliant on UE support of Rollator                             Pertinent Vitals/Pain Pain Assessment: No/denies pain    Home Living Family/patient expects to be discharged to:: (P) Private residence Living Arrangements: (P) Alone             Home Equipment: Walker - 4 wheels      Prior Function Level of Independence: Independent with assistive device(s)         Comments: pt reports ambulating independently with use of 4 wheeled walker with seat. Of note pt recently hospitalized and discharged to SNF, having just returned to ALF prior to current admission.     Hand Dominance        Extremity/Trunk Assessment   Upper Extremity Assessment Upper Extremity Assessment: Defer to OT evaluation    Lower Extremity Assessment Lower Extremity Assessment: Generalized weakness    Cervical /  Trunk Assessment Cervical / Trunk Assessment: Kyphotic  Communication   Communication: No difficulties  Cognition Arousal/Alertness: Awake/alert Behavior During Therapy: WFL for tasks assessed/performed Overall Cognitive Status: No  family/caregiver present to determine baseline cognitive functioning                                 General Comments: pt is oriented x4, pt often losing track of thought during session but is able to recall recent events of SNF stay and return to ALF. Pt follows commands well and demonstrates awareness of strength/mobility deficits.      General Comments General comments (skin integrity, edema, etc.): VSS on RA    Exercises     Assessment/Plan    PT Assessment Patient needs continued PT services  PT Problem List Decreased strength;Decreased activity tolerance;Decreased balance;Decreased mobility;Decreased cognition;Decreased safety awareness       PT Treatment Interventions DME instruction;Gait training;Functional mobility training;Therapeutic activities;Therapeutic exercise;Balance training;Neuromuscular re-education;Patient/family education    PT Goals (Current goals can be found in the Care Plan section)  Acute Rehab PT Goals Patient Stated Goal: to go home to ALF PT Goal Formulation: With patient Time For Goal Achievement: 10/07/20 Potential to Achieve Goals: Good    Frequency Min 3X/week   Barriers to discharge        Co-evaluation               AM-PAC PT "6 Clicks" Mobility  Outcome Measure Help needed turning from your back to your side while in a flat bed without using bedrails?: A Little Help needed moving from lying on your back to sitting on the side of a flat bed without using bedrails?: A Little Help needed moving to and from a bed to a chair (including a wheelchair)?: A Little Help needed standing up from a chair using your arms (e.g., wheelchair or bedside chair)?: A Little Help needed to walk in hospital room?: A Little Help needed climbing 3-5 steps with a railing? : A Lot 6 Click Score: 17    End of Session   Activity Tolerance: Patient tolerated treatment well Patient left: in chair;with call bell/phone within reach;Other  (comment) (pt left with OT) Nurse Communication: Mobility status PT Visit Diagnosis: Unsteadiness on feet (R26.81);Other abnormalities of gait and mobility (R26.89);Muscle weakness (generalized) (M62.81)    Time: 9371-6967 PT Time Calculation (min) (ACUTE ONLY): 33 min   Charges:   PT Evaluation $PT Eval Low Complexity: 1 Low          Arlyss Gandy, PT, DPT Acute Rehabilitation Pager: [phone]   Arlyss Gandy 09/23/2020, 9:25 AM

## 2020-09-23 NOTE — Progress Notes (Signed)
STROKE TEAM PROGRESS NOTE   INTERVAL HISTORY 87 year old white female with history of tia in past with no residual. Presented from SNF for generalized weakness, left more than right side. MRI brain shows no acute intracranial abnormality. Her daughter is at the bedside providing support.   Vitals:   09/23/20 0102 09/23/20 0254 09/23/20 0500 09/23/20 0807  BP: 125/66 (!) 135/56 (!) 133/55 (!) 136/51  Pulse: 61 (!) 55 60 64  Resp: Temp: (!) 97.4 F (36.3 C) (!) 97.4 F (36.3 C) (!) 97.5 F (36.4 C) 97.6 F (36.4 C)  TempSrc: Oral Oral Oral Oral  SpO2: 98% 99% 98% 98%  Weight:      Height:       CBC:  Recent Labs  Lab 09/22/20 1657 09/23/20 0552  WBC 7.4 5.9  NEUTROABS 4.6  --   HGB 11.8* 11.9*  HCT 36.6 36.3  MCV 94.6 92.6  PLT 172 184   Basic Metabolic Panel:  Recent Labs  Lab 09/22/20 1657 09/23/20 0552  NA 133* 136  K 4.3 4.5  CL 97* 102  CO2 26 26  GLUCOSE 89 93  BUN 17 15  CREATININE 1.21* 1.07*  CALCIUM 9.2 8.6*    Lipid Panel:  Recent Labs  Lab 09/23/20 0552  CHOL 178  TRIG 76  HDL 64  CHOLHDL 2.8  VLDL 15  LDLCALC 99    HgbA1c:  Recent Labs  Lab 09/23/20 0552  HGBA1C 5.3   Urine Drug Screen: No results for input(s): LABOPIA, COCAINSCRNUR, LABBENZ, AMPHETMU, THCU, LABBARB in the last 168 hours.  Alcohol Level No results for input(s): ETH in the last 168 hours.  IMAGING past 24 hours CT ANGIO HEAD W OR WO CONTRAST  Result Date: 09/23/2020 CLINICAL DATA:  Follow-up examination for stroke. EXAM: CT ANGIOGRAPHY HEAD AND NECK TECHNIQUE: Multidetector CT imaging of the head and neck was performed using the standard protocol during bolus administration of intravenous contrast. Multiplanar CT image reconstructions and MIPs were obtained to evaluate the vascular anatomy. Carotid stenosis measurements (when applicable) are obtained utilizing NASCET criteria, using the distal internal carotid diameter as the denominator. CONTRAST:  75mL  OMNIPAQUE IOHEXOL 350 MG/ML SOLN COMPARISON:  Prior MRI and CT from 09/22/2020. FINDINGS: CT HEAD FINDINGS Brain: Stable atrophy with chronic small vessel ischemic disease. No acute intracranial hemorrhage. No acute large vessel territory infarct. No mass lesion or midline shift. No hydrocephalus or extra-axial fluid collection. Vascular: No hyperdense vessel. Skull: Scalp soft tissues and calvarium are unchanged with no new abnormality. Sinuses: Clear. Orbits: Unremarkable. Review of the MIP images confirms the above findings CTA NECK FINDINGS Aortic arch: Aortic arch and origin of the great vessels not visualized on this exam. Right carotid system: Visualized right CCA patent to the bifurcation without stenosis. Eccentric bulky calcified plaque at the right carotid bulb/proximal right ICA with associated stenosis of up to 50% by NASCET criteria. Right ICA patent distally without stenosis. Mild irregularity involving the mid-distal right ICA favored to be atherosclerotic in nature, although changes of mild FMD could be considered. Left carotid system: Visualized left CCA patent to the bifurcation without stenosis. Bulky eccentric plaque at the left carotid bulb with mild 30% stenosis by NASCET criteria. Multifocal irregularity of the mid-distal left ICA favored to be atherosclerotic in nature, although changes of FMD could again be considered. Vertebral arteries: Both vertebral arteries arise from the subclavian arteries. Atheromatous plaque at the origin of the left vertebral artery with moderate  stenosis. Vertebral arteries otherwise patent within the neck without stenosis or other abnormality. Skeleton: No visible acute osseous finding. No worrisome osseous lesions. Moderate spondylosis at C5-6 and C6-7 with grade 1 anterolisthesis of C7 on T1. Other neck: No other acute soft tissue abnormality within the neck. No mass or adenopathy. 4 mm left thyroid nodule, of doubtful significance given size and patient age,  no follow-up imaging recommended (ref: J Am Coll Radiol. 2015 Feb;12(2): 143-50). Upper chest: Visualized lung apices are clear. Visualized upper esophagus is patulous. Review of the MIP images confirms the above findings CTA HEAD FINDINGS Anterior circulation: Petrous segments patent bilaterally. Atheromatous change within the carotid siphons without hemodynamically significant stenosis. 2 mm focal outpouching at the expected takeoff of the left PCOM noted, which could reflect a small vascular infundibulum related to a hypoplastic left posterior communicating artery versus a small aneurysm (series 10, image 107). A1 segments patent bilaterally. Normal anterior communicating artery complex. Anterior cerebral arteries patent their distal aspects without stenosis. No M1 stenosis or occlusion. Normal MCA bifurcations. Distal MCA branches well perfused and symmetric. Posterior circulation: Both V4 segments patent to the vertebrobasilar junction without stenosis. Both PICA origins patent and normal. Basilar patent to its distal aspect without stenosis. Superior cerebellar arteries patent bilaterally. Both PCAs patent and well perfused to their distal aspects. Venous sinuses: Not well assessed due to timing the contrast bolus. Anatomic variants: None significant. Review of the MIP images confirms the above findings IMPRESSION: CT HEAD IMPRESSION: 1. Stable head CT.  No acute intracranial abnormality. 2. Atrophy with moderate chronic microvascular ischemic disease. CTA HEAD AND NECK IMPRESSION: 1. Negative CTA for large vessel occlusion. 2. Atheromatous change about the carotid bifurcations with associated 50% stenosis on the right and 30% stenosis on the left. 3. Moderate stenosis at the origin of the left vertebral artery. Otherwise wide patency of the posterior circulation. 4. 2 mm focal outpouching at the expected takeoff of the left PCOM, which could reflect a small vascular infundibulum versus a small aneurysm.  Attention at follow-up recommended. 5. Mild irregularity involving the mid-distal ICAs bilaterally, favored to be atherosclerotic in nature, although changes of mild FMD could be considered. Electronically Signed   By: Rise Mu M.D.   On: 09/23/2020 03:36   DG Chest 2 View  Result Date: 09/22/2020 CLINICAL DATA:  Fatigue, facial droop EXAM: CHEST - 2 VIEW COMPARISON:  08/20/2020 FINDINGS: The heart size and mediastinal contours are within normal limits. Previously noted pleural effusions have resolved. No focal pulmonary opacity. The visualized skeletal structures are unremarkable. IMPRESSION: No active cardiopulmonary disease. Electronically Signed   By: Wiliam Ke M.D.   On: 09/22/2020 18:16   CT HEAD WO CONTRAST ( )  Result Date: 09/22/2020 CLINICAL DATA:  TIA.  Left facial droop left arm weakness. EXAM: CT HEAD WITHOUT CONTRAST TECHNIQUE: Contiguous axial images were obtained from the base of the skull through the vertex without intravenous contrast. COMPARISON:  CT head 03/08/2019. FINDINGS: Brain: No evidence of acute infarction, hemorrhage, hydrocephalus, extra-axial collection or mass lesion/mass effect. There is stable mild diffuse atrophy. There is also stable moderate periventricular white matter hypodensity, likely chronic small vessel ischemic change. Vascular: Atherosclerotic calcifications are present within the cavernous internal carotid arteries. Skull: Normal. Negative for fracture or focal lesion. Sinuses/Orbits: No acute finding. Other: None. IMPRESSION: 1. No acute intracranial process. 2. Stable mild diffuse atrophy and moderate chronic small vessel ischemic change. Electronically Signed   By: Darliss Cheney M.D.   On: 09/22/2020  18:07   CT ANGIO NECK W OR WO CONTRAST  Result Date: 09/23/2020 CLINICAL DATA:  Follow-up examination for stroke. EXAM: CT ANGIOGRAPHY HEAD AND NECK TECHNIQUE: Multidetector CT imaging of the head and neck was performed using the standard  protocol during bolus administration of intravenous contrast. Multiplanar CT image reconstructions and MIPs were obtained to evaluate the vascular anatomy. Carotid stenosis measurements (when applicable) are obtained utilizing NASCET criteria, using the distal internal carotid diameter as the denominator. CONTRAST:  41mL OMNIPAQUE IOHEXOL 350 MG/ML SOLN COMPARISON:  Prior MRI and CT from 09/22/2020. FINDINGS: CT HEAD FINDINGS Brain: Stable atrophy with chronic small vessel ischemic disease. No acute intracranial hemorrhage. No acute large vessel territory infarct. No mass lesion or midline shift. No hydrocephalus or extra-axial fluid collection. Vascular: No hyperdense vessel. Skull: Scalp soft tissues and calvarium are unchanged with no new abnormality. Sinuses: Clear. Orbits: Unremarkable. Review of the MIP images confirms the above findings CTA NECK FINDINGS Aortic arch: Aortic arch and origin of the great vessels not visualized on this exam. Right carotid system: Visualized right CCA patent to the bifurcation without stenosis. Eccentric bulky calcified plaque at the right carotid bulb/proximal right ICA with associated stenosis of up to 50% by NASCET criteria. Right ICA patent distally without stenosis. Mild irregularity involving the mid-distal right ICA favored to be atherosclerotic in nature, although changes of mild FMD could be considered. Left carotid system: Visualized left CCA patent to the bifurcation without stenosis. Bulky eccentric plaque at the left carotid bulb with mild 30% stenosis by NASCET criteria. Multifocal irregularity of the mid-distal left ICA favored to be atherosclerotic in nature, although changes of FMD could again be considered. Vertebral arteries: Both vertebral arteries arise from the subclavian arteries. Atheromatous plaque at the origin of the left vertebral artery with moderate stenosis. Vertebral arteries otherwise patent within the neck without stenosis or other abnormality.  Skeleton: No visible acute osseous finding. No worrisome osseous lesions. Moderate spondylosis at C5-6 and C6-7 with grade 1 anterolisthesis of C7 on T1. Other neck: No other acute soft tissue abnormality within the neck. No mass or adenopathy. 4 mm left thyroid nodule, of doubtful significance given size and patient age, no follow-up imaging recommended (ref: J Am Coll Radiol. 2015 Feb;12(2): 143-50). Upper chest: Visualized lung apices are clear. Visualized upper esophagus is patulous. Review of the MIP images confirms the above findings CTA HEAD FINDINGS Anterior circulation: Petrous segments patent bilaterally. Atheromatous change within the carotid siphons without hemodynamically significant stenosis. 2 mm focal outpouching at the expected takeoff of the left PCOM noted, which could reflect a small vascular infundibulum related to a hypoplastic left posterior communicating artery versus a small aneurysm (series 10, image 107). A1 segments patent bilaterally. Normal anterior communicating artery complex. Anterior cerebral arteries patent their distal aspects without stenosis. No M1 stenosis or occlusion. Normal MCA bifurcations. Distal MCA branches well perfused and symmetric. Posterior circulation: Both V4 segments patent to the vertebrobasilar junction without stenosis. Both PICA origins patent and normal. Basilar patent to its distal aspect without stenosis. Superior cerebellar arteries patent bilaterally. Both PCAs patent and well perfused to their distal aspects. Venous sinuses: Not well assessed due to timing the contrast bolus. Anatomic variants: None significant. Review of the MIP images confirms the above findings IMPRESSION: CT HEAD IMPRESSION: 1. Stable head CT.  No acute intracranial abnormality. 2. Atrophy with moderate chronic microvascular ischemic disease. CTA HEAD AND NECK IMPRESSION: 1. Negative CTA for large vessel occlusion. 2. Atheromatous change about the carotid  bifurcations with  associated 50% stenosis on the right and 30% stenosis on the left. 3. Moderate stenosis at the origin of the left vertebral artery. Otherwise wide patency of the posterior circulation. 4. 2 mm focal outpouching at the expected takeoff of the left PCOM, which could reflect a small vascular infundibulum versus a small aneurysm. Attention at follow-up recommended. 5. Mild irregularity involving the mid-distal ICAs bilaterally, favored to be atherosclerotic in nature, although changes of mild FMD could be considered. Electronically Signed   By: Rise Mu M.D.   On: 09/23/2020 03:36   MR BRAIN WO CONTRAST  Result Date: 09/22/2020 CLINICAL DATA:  Transient ischemic attack (TIA) EXAM: MRI HEAD WITHOUT CONTRAST TECHNIQUE: Multiplanar, multiecho pulse sequences of the brain and surrounding structures were obtained without intravenous contrast. COMPARISON:  MRI 09/11/2012.  CT head from the same day. FINDINGS: Brain: No acute infarction, hemorrhage, hydrocephalus, extra-axial collection or mass lesion. Moderate atrophy with ex vacuo ventricular dilation. Moderate patchy T2 hyperintensity within the supratentorial and pontine white matter, nonspecific but compatible with chronic microvascular ischemic disease. Vascular: Major arterial flow voids are maintained at the skull base. Skull and upper cervical spine: Normal marrow signal. Sinuses/Orbits: Clear sinuses.  Unremarkable orbits. Other: No mastoid effusions. IMPRESSION: 1. No evidence of acute intracranial abnormality. 2. Moderate atrophy and chronic microvascular ischemic disease. Electronically Signed   By: Feliberto Harts M.D.   On: 09/22/2020 20:24    PHYSICAL EXAM  General: Awake alert in no distress HEENT: Normocephalic/atraumatic Eyes: Clear Cardiovascular: Regular rhythm next abdomen nondistended nontender Extremities warm well perfused Neurological exam Awake alert oriented x3 No dysarthria No aphasia Slightly diminished attention  concentration Cranial nerves II to XII intact Motor exam with antigravity extremities-all fours without drift Sensation intact light touch without extinction Coordination with no dysmetria.  There is mild action tremor. Gait testing deferred  ASSESSMENT/PLAN Ms. Samantha Crane is a 87 y.o. female with history of  TIA many years ago-does not remember symptoms, no residual deficits, anxiety, depression, hypertension, hyperlipidemia, orthostatic tremor, vitamin D deficiency, resident of a retirement home requiring considerable help with ADLs, presenting for evaluation of generalized weakness and left-sided weakness. Last known well was before 9 AM today. She was noted to have swerving to the left and EMS were called for generalized weakness and complaints of dropping objects noted left-sided facial droop.  The symptoms had resolved by the time she came to the emergency room around 5 PM.  Her being outside the window for IV tPA precluded activation of code stroke. CT head and MRI of the brain are negative She is admitted for TIA work-up secondary to focal left-sided transient symptoms.    TIA:     Code Stroke  CT head No acute abnormality.  Small vessel disease. Atrophy.  ASPECTS 10.     CTA head & neck: . Negative CTA for large vessel occlusion. 2. Atheromatous change about the carotid bifurcations with associated 50% stenosis on the right and 30% stenosis on the left. 3. Moderate stenosis at the origin of the left vertebral artery. Otherwise wide patency of the posterior circulation. 4. 2 mm focal outpouching at the expected takeoff of the left PCOM, which could reflect a small vascular infundibulum versus a small aneurysm. Attention at follow-up recommended. 5. Mild irregularity involving the mid-distal ICAs bilaterally, favored to be atherosclerotic in nature, although changes of mild FMD could be considered.  MRI BRAIN: 1. No evidence of acute intracranial abnormality. 2. Moderate atrophy  and chronic microvascular ischemic  disease. 2D Echo done and results are pending   LDL 99 HgbA1c 5.3 VTE prophylaxis -  scd     Diet   DIET SOFT Room service appropriate? Yes; Fluid consistency: Thin   No antithrombotic prior to admission, now on aspirin 81 mg daily.   Therapy recommendations:  home health PT.  Disposition:  snf  We will sign off at this time but remain available to assist.   Hypertension On lisinopril 20mg  daily, not resumed  Long term BP goal normotensive  Hyperlipidemia Home meds:  atorvastatin 10mg  ,  resumed in hospital LDL 99, goal < 70   High intensity statin not added d/t advanced age  Continue statin at discharge  HgbA1c 5.3, goal < 7.0 CBGs No results for input(s): GLUCAP in the last 72 hours.  SSI  Other Stroke Risk Factors  Advanced Age >/= 7265  Hx stroke/TIA  Other Active Problems  2. CKD 3a  - SCr is 1.21 on admission, up from 0.78 a month ago  - She appears hypovolemic in ED, plan to hold lisinopril, hydrate with NS infusion before and after CT contrast, and monitor     3. Depression, anxiety  - Continue home regimen, consider reducing Wellbutrin dose if renal function does not improve     4. Hyponatremia  - Serum sodium 133 on admission in setting of hypovolemia  - Hydrate with NS, monitor    5. Chronic diastolic CHF  - Appears hypovolemic on admission  - Hydrate overnight, monitor volume status     6. Hypertension  - Hold lisinopril initially   Hospital day # 0     To contact Stroke Continuity provider, please refer to [email or web address]. After hours, contact General Neurology

## 2020-09-23 NOTE — Progress Notes (Signed)
Occupational Therapy Evaluation Patient Details Name: Samantha Crane MRN: [id number] DOB: Jun 23, 1929 Today's Date: 09/23/2020    History of Present Illness 87 y.o. female presents to [facility] ED on 09/22/2020 with transient L sided weakness and fatigue. Pt recently returned to ALF from SNF when symptoms began. Imaging negative for acute findings. PMH includes depression, anxiety, insomnia, hypertension, resting tremor, chronic kidney disease stage IIIa, and remote TIA.   Clinical Impression   Samantha Crane was evaluated s/p the above admission list. PTA she resided at an ALF where she was mod I with use of rollator, completed ADLs independently with the exception of bathing (supervision A for showering). Upon evaluation, pt was min guard-set up for all ADLs including dressing and toileting. She also completed functional ambulation with rollator with min guard. Pt demonstrated poor activity tolerance and fatigues very quickly, creating safety concerns and fall risk.pt would benefit from OT acutely. Recommend d/c to ALF with increased support from staff and [facility] services.     Follow Up Recommendations  Home health OT;Supervision - Intermittent (D/c back to ALF if incrased staff assist is available. If pt is not able to receive incrased support at ALF, she will likely need short ANF stay prior to d/c home at a mod I level.)    Equipment Recommendations  None recommended by OT (Facility has all needs)    Recommendations for Other Services       Precautions / Restrictions Precautions Precautions: None Restrictions Weight Bearing Restrictions: No      Mobility Bed Mobility Overal bed mobility: Needs Assistance Bed Mobility: Rolling;Sidelying to Sit Rolling: Min guard Sidelying to sit: Min assist       General bed mobility comments: pt sitting in recliner upon arrival    Transfers Overall transfer level: Needs assistance Equipment used: 4-wheeled walker Transfers: Sit to/from Stand Sit to Stand: Min  guard         General transfer comment: min guard for all sit<>stnads this session for safety. Pt required incrased time and effort however no physcial assist needed    Balance Overall balance assessment: Needs assistance Sitting-balance support: Single extremity supported;Feet supported Sitting balance-Leahy Scale: Poor Sitting balance - Comments: pt completed LB ADLs sitting on edge of chair however demonstrated poor trunk control and continued to melt backwards into the chair Postural control: Posterior lean Standing balance support: Bilateral upper extremity supported Standing balance-Leahy Scale: Poor Standing balance comment: reliant on UE support of Rollator                           ADL either performed or assessed with clinical judgement   ADL Overall ADL's : Needs assistance/impaired Eating/Feeding: NPO   Grooming: Set up;Sitting   Upper Body Bathing: Set up;Sitting   Lower Body Bathing: Minimal assistance;Sit to/from stand Lower Body Bathing Details (indicate cue type and reason): min A for rear perineal area in standing Upper Body Dressing : Set up;Sitting   Lower Body Dressing: Set up;Minimal assistance;Sit to/from stand Lower Body Dressing Details (indicate cue type and reason): pt able to don bilat socks in sitting with set up. Min a for adjusting LB clothing over bilat hips in standing Toilet Transfer: Min guard;RW;Regular Toilet;Grab bars;Ambulation Toilet Transfer Details (indicate cue type and reason): pt ambulated from chair>toilet with rollator given min guard for safety Toileting- Clothing Manipulation and Hygiene: Supervision/safety;Sitting/lateral lean Toileting - Clothing Manipulation Details (indicate cue type and reason): able to complete pericare in sitting  Functional mobility during ADLs: Min guard (rollator) General ADL Comments: pt completed ADLs well this session with very minimal assist given; however pt fatigues very quickly and  requires multiple rest breaks for safety     Vision Baseline Vision/History: 0 No visual deficits Ability to See in Adequate Light: 0 Adequate Vision Assessment?: No apparent visual deficits     Perception     Praxis      Pertinent Vitals/Pain Pain Assessment: No/denies pain     Hand Dominance Right   Extremity/Trunk Assessment Upper Extremity Assessment Upper Extremity Assessment: Generalized weakness   Lower Extremity Assessment Lower Extremity Assessment: Defer to PT evaluation   Cervical / Trunk Assessment Cervical / Trunk Assessment: Kyphotic   Communication Communication Communication: No difficulties   Cognition Arousal/Alertness: Awake/alert Behavior During Therapy: WFL for tasks assessed/performed Overall Cognitive Status: No family/caregiver present to determine baseline cognitive functioning                                 General Comments: pt is oriented x4. pt was noted getting off track in conversation however was abel to recall history and curent events well. She does have limited insight to deficits and safety   General Comments  VSS on RA. Pt verbalized strong desire to return back to her ALF. Also reported feeling grogey, likely from medication    Exercises     Shoulder Instructions      Home Living Family/patient expects to be discharged to:: Assisted living ([facility]) Living Arrangements: Alone Available Help at Discharge: Available PRN/intermittently                         Home Equipment: Walker - 4 wheels          Prior Functioning/Environment Level of Independence: Independent with assistive device(s)        Comments: pt reports ambulating independently with use of 4 wheeled walker with seat. Of note pt recently hospitalized and discharged to SNF, having just returned to ALF prior to current admission.        OT Problem List: Decreased strength;Decreased range of motion;Decreased activity  tolerance;Impaired balance (sitting and/or standing);Decreased safety awareness      OT Treatment/Interventions: Self-care/ADL training;Therapeutic exercise;DME and/or AE instruction;Balance training;Patient/family education;Therapeutic activities    OT Goals(Current goals can be found in the care plan section) Acute Rehab OT Goals Patient Stated Goal: to go home to ALF OT Goal Formulation: With patient Time For Goal Achievement: 10/07/20 Potential to Achieve Goals: Fair ADL Goals Pt Will Perform Lower Body Bathing: with modified independence;sit to/from stand Pt Will Perform Lower Body Dressing: with modified independence;sit to/from stand Pt Will Transfer to Toilet: with modified independence;ambulating;regular height toilet;grab bars Pt Will Perform Tub/Shower Transfer: with min guard assist;ambulating;shower seat Additional ADL Goal #1: Pt will tolerate at least 5 minutes of OOB funcitonal activity with supervision A to ensure safe d/c home  OT Frequency: Min 2X/week   Barriers to D/C:            Co-evaluation              AM-PAC OT "6 Clicks" Daily Activity     Outcome Measure Help from another person eating meals?: Total Help from another person taking care of personal grooming?: A Little Help from another person toileting, which includes using toliet, bedpan, or urinal?: A Little Help from another person bathing (including washing, rinsing,  drying)?: A Little Help from another person to put on and taking off regular upper body clothing?: None Help from another person to put on and taking off regular lower body clothing?: A Little 6 Click Score: 17   End of Session Equipment Utilized During Treatment: Gait belt (rollator) Nurse Communication: Weight bearing status;Precautions;Mobility status (left purwick out)  Activity Tolerance: Patient tolerated treatment well Patient left: in chair;with call bell/phone within reach;with chair alarm set  OT Visit Diagnosis:  Unsteadiness on feet (R26.81);Other abnormalities of gait and mobility (R26.89);Muscle weakness (generalized) (M62.81);Pain                Time: 8938-1017 OT Time Calculation (min): 32 min Charges:  OT General Charges $OT Visit: 1 Visit OT Evaluation $OT Eval Moderate Complexity: 1 Mod OT Treatments $Self Care/Home Management : 8-22 mins   Winta Barcelo A Dondre Catalfamo 09/23/2020, 9:44 AM

## 2020-09-23 NOTE — Discharge Summary (Addendum)
Discharge Summary  Samantha Crane NIO:[id number] DOB: 29-Sep-1929  PCP: Nadara Eaton, MD  Admit date: 09/22/2020 Discharge date: 09/23/2020  Time spent: 35 minutes   Recommendations for Outpatient Follow-up:  Follow up with neurology Follow up with your PCP Take your medications as prescribed  Continue fall precautions Resume your home health PT OT at ALF.  Discharge Diagnoses:  Active Hospital Problems   Diagnosis Date Noted   Weakness 09/22/2020   Chronic diastolic CHF (congestive heart failure) (HCC) 08/20/2020   Hyponatremia 12/18/2016   Chronic kidney disease (CKD) stage G3a/A1, moderately decreased glomerular filtration rate (GFR) between 45-59 mL/min/1.73 square meter and albuminuria creatinine ratio less than 30 mg/g (HCC) 11/30/2013   Essential hypertension 08/08/2010   Anxiety 08/08/2010   Major depressive disorder, single episode 08/08/2010    Resolved Hospital Problems  No resolved problems to display.    Discharge Condition: Stable   Diet recommendation: Resume previous diet   Vitals:   09/23/20 0807 09/23/20 1208  BP: (!) 136/51 (!) 116/41  Pulse: 64 64  Resp: 17 18  Temp: 97.6 F (36.4 C) 97.6 F (36.4 C)  SpO2: 98% 96%    History of present illness:   Samantha Crane is a 87 y.o. female with medical history significant for depression, anxiety, insomnia, hypertension, chronic kidney disease stage IIIa, and remote TIA now presenting with weakness, fatigue, and transient left-sided weakness.  Patient had been admitted a month ago with sepsis secondary to cellulitis, was discharged to an SNF, just returned to her ALF yesterday, reports increased fatigue yesterday, and then was having difficulty ambulating and dropping things this morning. EMS reported a left facial droop and left arm weakness that resolved prior to arrival to ED. She uses a walker at baseline, has resting tremor that she reports to be worsening over several months. She denies fall or trauma,  denies fever or chills, and denies chest pain or palpitations.    ED Course: Upon arrival to the ED, patient is found to be afebrile with stable vitals. EKG with SR. CXR, head CT, and MRI brain all negative for acute findings. Labs notable for mild hyponatremia and renal insufficiency. Hospitalists were asked to admit for TIA workup.   09/23/20: Patient was seen and examined at her bedside.  She reports all her symptoms have resolved.  She has no new complaints.  She wants to go home.  Hospital Course:  Principal Problem:   Weakness Active Problems:   Anxiety   Chronic kidney disease (CKD) stage G3a/A1, moderately decreased glomerular filtration rate (GFR) between 45-59 mL/min/1.73 square meter and albuminuria creatinine ratio less than 30 mg/g (HCC)   Essential hypertension   Hyponatremia   Major depressive disorder, single episode   Chronic diastolic CHF (congestive heart failure) (HCC)  TIA:     Code Stroke  CT head No acute abnormality.  Small vessel disease. Atrophy.  ASPECTS 10.     CTA head & neck: .Negative CTA for large vessel occlusion. 2. Atheromatous change about the carotid bifurcations with associated 50% stenosis on the right and 30% stenosis on the left. 3. Moderate stenosis at the origin of the left vertebral artery. Otherwise wide patency of the posterior circulation. 4. 2 mm focal outpouching at the expected takeoff of the left PCOM, which could reflect a small vascular infundibulum versus a small aneurysm. Attention at follow-up recommended. 5. Mild irregularity involving the mid-distal ICAs bilaterally, favored to be atherosclerotic in nature, although changes of mild FMD could be considered.  MRI  BRAIN:  1. No evidence of acute intracranial abnormality. 2. Moderate atrophy and chronic microvascular ischemic disease. 2D Echo done and results: Left ventricular ejection fraction, by estimation, is 60 to 65%. The left ventricle has normal function. LDL 99 HgbA1c  5.3 No antithrombotic prior to admission, now on aspirin 325 mg daily.   Stroke team/neurology recommended aspirin 325 mg daily. Follow-up with neurology outpatient. Therapy recommendations: Resume home health PT OT at ALF.    Hypertension Long term BP goal normotensive Follow-up with your PCP.   Hyperlipidemia Home meds:  atorvastatin 10mg , dose increased to 40 mg nightly. LDL 99, goal < 70 Continue Lipitor 40 mg nightly. Follow-up with your PCP  Other Stroke Risk Factors  Advanced Age >/= 75  Hx of TIA   Other Active Problems AKI, resolving - SCr is 1.21 on admission, up from 0.78 a month ago  Creatinine is down trending to 1.07 with GFR 50. Follow-up with your PCP.   Depression, anxiety  - Continue home regimen   Resolved hypovolemic hyponatremia post IV fluid NS hydration - Serum sodium 133 on admission Serum sodium 136 on 09/23/2020   Chronic diastolic CHF  -No acute issues 2D echo 09/23/2020 with normal LVEF 60 to 65% and grade 1 diastolic dysfunction.   Procedures: 2D echo  Consultations: Neurology  Discharge Exam: BP (!) 116/41 (BP Location: Left Arm)   Pulse 64   Temp 97.6 F (36.4 C) (Oral)   Resp 18   Ht 5\' 5"  (1.651 m)   Wt 55.3 kg   SpO2 96%   BMI 20.30 kg/m  General: 87 y.o. year-old female well developed well nourished in no acute distress.  Alert and oriented x3. Cardiovascular: Regular rate and rhythm with no rubs or gallops.  No thyromegaly or JVD noted.   Respiratory: Clear to auscultation with no wheezes or rales. Good inspiratory effort. Abdomen: Soft nontender nondistended with normal bowel sounds x4 quadrants. Musculoskeletal: No lower extremity edema. 2/4 pulses in all 4 extremities. Skin: No ulcerative lesions noted or rashes, Psychiatry: Mood is appropriate for condition and setting  Discharge Instructions You were cared for by a hospitalist during your hospital stay. If you have any questions about your discharge medications or  the care you received while you were in the hospital after you are discharged, you can call the unit and asked to speak with the hospitalist on call if the hospitalist that took care of you is not available. Once you are discharged, your primary care physician will handle any further medical issues. Please note that NO REFILLS for any discharge medications will be authorized once you are discharged, as it is imperative that you return to your primary care physician (or establish a relationship with a primary care physician if you do not have one) for your aftercare needs so that they can reassess your need for medications and monitor your lab values.   Allergies as of 09/23/2020       Reactions   Prednisone Other (See Comments)   Manic tendencies   Amlodipine Swelling   Amoxicillin Other (See Comments)   Unknown reaction   Amoxicillin-pot Clavulanate Diarrhea   Tolerated Unasyn 08/2020   Lactose Intolerance (gi) Other (See Comments)   Unknown reaction   Limonene Diarrhea   Sulfa Antibiotics Other (See Comments)   Unknown reaction        Medication List     TAKE these medications    acetaminophen 325 MG tablet Commonly known as: TYLENOL Take 650 mg  by mouth every 4 (four) hours as needed (elevated temperature/generalized discomfort).   albuterol 108 (90 Base) MCG/ACT inhaler Commonly known as: VENTOLIN HFA Inhale 2 puffs into the lungs every 6 (six) hours as needed for wheezing or shortness of breath.   aspirin 325 MG tablet Take 1 tablet (325 mg total) by mouth daily.   atorvastatin 40 MG tablet Commonly known as: LIPITOR Take 1 tablet (40 mg total) by mouth at bedtime. What changed:  medication strength how much to take   Bacid Caps Take 1 capsule by mouth 2 (two) times daily. Bacid with Lactospore 1 billion cell   buPROPion 150 MG 12 hr tablet Commonly known as: WELLBUTRIN SR Take 150 mg by mouth 2 (two) times daily.   Calcium 600/Vitamin D3 600-800 MG-UNIT  Tabs Generic drug: Calcium Carb-Cholecalciferol Take 1 tablet by mouth 2 (two) times daily.   calcium carbonate 500 MG chewable tablet Commonly known as: TUMS - dosed in mg elemental calcium Chew 1,000 mg by mouth every 4 (four) hours as needed (gas pain).   cetirizine 5 MG tablet Commonly known as: ZYRTEC Take 5 mg by mouth every morning.   Chloraseptic 6-10 MG lozenge Generic drug: benzocaine-menthol Take 1 lozenge by mouth 3 (three) times daily as needed for sore throat.   clonazePAM 0.5 MG tablet Commonly known as: KLONOPIN Take 0.5 tablets (0.25 mg total) by mouth See admin instructions for 4 doses. 0.25mg  oral three times daily And 0.25mg  oral twice daily as needed for anxiety What changed:  when to take this additional instructions   conjugated estrogens vaginal cream Commonly known as: PREMARIN Place 1 applicator vaginally every Monday, Wednesday, and Friday. At bedtime   docusate sodium 100 MG capsule Commonly known as: COLACE Take 100 mg by mouth 2 (two) times daily.   ferrous sulfate 325 (65 FE) MG tablet Take 325 mg by mouth every morning.   fluticasone 50 MCG/ACT nasal spray Commonly known as: FLONASE Place 2 sprays into both nostrils daily as needed for allergies or rhinitis. What changed:  how much to take when to take this   gabapentin 300 MG capsule Commonly known as: NEURONTIN Take 300 mg by mouth every morning.   guaiFENesin 600 MG 12 hr tablet Commonly known as: MUCINEX Take 600 mg by mouth 2 (two) times daily as needed for cough.   lisinopril 20 MG tablet Commonly known as: ZESTRIL Take 20 mg by mouth every morning.   loperamide 2 MG tablet Commonly known as: IMODIUM A-D Take 2 mg by mouth daily as needed for diarrhea or loose stools.   mirtazapine 15 MG tablet Commonly known as: REMERON Take 15 mg by mouth at bedtime.   multivitamin with minerals Tabs tablet Take 1 tablet by mouth every morning.   nitrofurantoin 100 MG  capsule Commonly known as: MACRODANTIN Take 100 mg by mouth 2 (two) times daily.   ondansetron 4 MG tablet Commonly known as: ZOFRAN Take 4-8 mg by mouth See admin instructions. Take one tablet (4 mg) by mouth every 8 hours as needed for nausea/vomiting; if not effective after 2 doses increase to 8 mg.   pantoprazole 40 MG tablet Commonly known as: PROTONIX Take 40 mg by mouth every morning.   polyethylene glycol 17 g packet Commonly known as: MIRALAX / GLYCOLAX Take 17 g by mouth daily as needed (constipation). Stir and dissolve in 4-8 oz gatorade/beverage   sertraline 100 MG tablet Commonly known as: ZOLOFT Take 100 mg by mouth every morning.  sodium chloride 0.65 % Soln nasal spray Commonly known as: OCEAN Place 1 spray into both nostrils every morning.   valACYclovir 1000 MG tablet Commonly known as: VALTREX Take 1 g by mouth 3 (three) times daily as needed (cold sores).   zaleplon 10 MG capsule Commonly known as: SONATA Take 10 mg by mouth at bedtime as needed for sleep.       Allergies  Allergen Reactions   Prednisone Other (See Comments)     Manic tendencies   Amlodipine Swelling   Amoxicillin Other (See Comments)    Unknown reaction   Amoxicillin-Pot Clavulanate Diarrhea    Tolerated Unasyn 08/2020   Lactose Intolerance (Gi) Other (See Comments)    Unknown reaction   Limonene Diarrhea   Sulfa Antibiotics Other (See Comments)    Unknown reaction    Follow-up Information     Nadara Eaton, MD. Call today.   Specialty: Internal Medicine Why: Please call for a posthospital follow-up appointment. Contact information: [address] [place] [address] [phone]         [facility]. Call in 1 day(s).   Why: Please call for a post hospital follow-up appointment. Contact information: [address]     [address] [place] [address] [phone]                 The results of significant  diagnostics from this hospitalization (including imaging, microbiology, ancillary and laboratory) are listed below for reference.    Significant Diagnostic Studies: CT ANGIO HEAD W OR WO CONTRAST  Result Date: 09/23/2020 CLINICAL DATA:  Follow-up examination for stroke. EXAM: CT ANGIOGRAPHY HEAD AND NECK TECHNIQUE: Multidetector CT imaging of the head and neck was performed using the standard protocol during bolus administration of intravenous contrast. Multiplanar CT image reconstructions and MIPs were obtained to evaluate the vascular anatomy. Carotid stenosis measurements (when applicable) are obtained utilizing NASCET criteria, using the distal internal carotid diameter as the denominator. CONTRAST:  25mL OMNIPAQUE IOHEXOL 350 MG/ML SOLN COMPARISON:  Prior MRI and CT from 09/22/2020. FINDINGS: CT HEAD FINDINGS Brain: Stable atrophy with chronic small vessel ischemic disease. No acute intracranial hemorrhage. No acute large vessel territory infarct. No mass lesion or midline shift. No hydrocephalus or extra-axial fluid collection. Vascular: No hyperdense vessel. Skull: Scalp soft tissues and calvarium are unchanged with no new abnormality. Sinuses: Clear. Orbits: Unremarkable. Review of the MIP images confirms the above findings CTA NECK FINDINGS Aortic arch: Aortic arch and origin of the great vessels not visualized on this exam. Right carotid system: Visualized right CCA patent to the bifurcation without stenosis. Eccentric bulky calcified plaque at the right carotid bulb/proximal right ICA with associated stenosis of up to 50% by NASCET criteria. Right ICA patent distally without stenosis. Mild irregularity involving the mid-distal right ICA favored to be atherosclerotic in nature, although changes of mild FMD could be considered. Left carotid system: Visualized left CCA patent to the bifurcation without stenosis. Bulky eccentric plaque at the left carotid bulb with mild 30% stenosis by NASCET criteria.  Multifocal irregularity of the mid-distal left ICA favored to be atherosclerotic in nature, although changes of FMD could again be considered. Vertebral arteries: Both vertebral arteries arise from the subclavian arteries. Atheromatous plaque at the origin of the left vertebral artery with moderate stenosis. Vertebral arteries otherwise patent within the neck without stenosis or other abnormality. Skeleton: No visible acute osseous finding. No worrisome osseous lesions. Moderate spondylosis at C5-6 and C6-7 with grade 1  anterolisthesis of C7 on T1. Other neck: No other acute soft tissue abnormality within the neck. No mass or adenopathy. 4 mm left thyroid nodule, of doubtful significance given size and patient age, no follow-up imaging recommended (ref: J Am Coll Radiol. 2015 Feb;12(2): 143-50). Upper chest: Visualized lung apices are clear. Visualized upper esophagus is patulous. Review of the MIP images confirms the above findings CTA HEAD FINDINGS Anterior circulation: Petrous segments patent bilaterally. Atheromatous change within the carotid siphons without hemodynamically significant stenosis. 2 mm focal outpouching at the expected takeoff of the left PCOM noted, which could reflect a small vascular infundibulum related to a hypoplastic left posterior communicating artery versus a small aneurysm (series 10, image 107). A1 segments patent bilaterally. Normal anterior communicating artery complex. Anterior cerebral arteries patent their distal aspects without stenosis. No M1 stenosis or occlusion. Normal MCA bifurcations. Distal MCA branches well perfused and symmetric. Posterior circulation: Both V4 segments patent to the vertebrobasilar junction without stenosis. Both PICA origins patent and normal. Basilar patent to its distal aspect without stenosis. Superior cerebellar arteries patent bilaterally. Both PCAs patent and well perfused to their distal aspects. Venous sinuses: Not well assessed due to timing the  contrast bolus. Anatomic variants: None significant. Review of the MIP images confirms the above findings IMPRESSION: CT HEAD IMPRESSION: 1. Stable head CT.  No acute intracranial abnormality. 2. Atrophy with moderate chronic microvascular ischemic disease. CTA HEAD AND NECK IMPRESSION: 1. Negative CTA for large vessel occlusion. 2. Atheromatous change about the carotid bifurcations with associated 50% stenosis on the right and 30% stenosis on the left. 3. Moderate stenosis at the origin of the left vertebral artery. Otherwise wide patency of the posterior circulation. 4. 2 mm focal outpouching at the expected takeoff of the left PCOM, which could reflect a small vascular infundibulum versus a small aneurysm. Attention at follow-up recommended. 5. Mild irregularity involving the mid-distal ICAs bilaterally, favored to be atherosclerotic in nature, although changes of mild FMD could be considered. Electronically Signed   By: Rise Mu M.D.   On: 09/23/2020 03:36   DG Chest 2 View  Result Date: 09/22/2020 CLINICAL DATA:  Fatigue, facial droop EXAM: CHEST - 2 VIEW COMPARISON:  08/20/2020 FINDINGS: The heart size and mediastinal contours are within normal limits. Previously noted pleural effusions have resolved. No focal pulmonary opacity. The visualized skeletal structures are unremarkable. IMPRESSION: No active cardiopulmonary disease. Electronically Signed   By: Wiliam Ke M.D.   On: 09/22/2020 18:16   CT HEAD WO CONTRAST ( )  Result Date: 09/22/2020 CLINICAL DATA:  TIA.  Left facial droop left arm weakness. EXAM: CT HEAD WITHOUT CONTRAST TECHNIQUE: Contiguous axial images were obtained from the base of the skull through the vertex without intravenous contrast. COMPARISON:  CT head 03/08/2019. FINDINGS: Brain: No evidence of acute infarction, hemorrhage, hydrocephalus, extra-axial collection or mass lesion/mass effect. There is stable mild diffuse atrophy. There is also stable moderate  periventricular white matter hypodensity, likely chronic small vessel ischemic change. Vascular: Atherosclerotic calcifications are present within the cavernous internal carotid arteries. Skull: Normal. Negative for fracture or focal lesion. Sinuses/Orbits: No acute finding. Other: None. IMPRESSION: 1. No acute intracranial process. 2. Stable mild diffuse atrophy and moderate chronic small vessel ischemic change. Electronically Signed   By: Darliss Cheney M.D.   On: 09/22/2020 18:07   CT ANGIO NECK W OR WO CONTRAST  Result Date: 09/23/2020 CLINICAL DATA:  Follow-up examination for stroke. EXAM: CT ANGIOGRAPHY HEAD AND NECK TECHNIQUE: Multidetector CT imaging of  the head and neck was performed using the standard protocol during bolus administration of intravenous contrast. Multiplanar CT image reconstructions and MIPs were obtained to evaluate the vascular anatomy. Carotid stenosis measurements (when applicable) are obtained utilizing NASCET criteria, using the distal internal carotid diameter as the denominator. CONTRAST:  75mL OMNIPAQUE IOHEXOL 350 MG/ML SOLN COMPARISON:  Prior MRI and CT from 09/22/2020. FINDINGS: CT HEAD FINDINGS Brain: Stable atrophy with chronic small vessel ischemic disease. No acute intracranial hemorrhage. No acute large vessel territory infarct. No mass lesion or midline shift. No hydrocephalus or extra-axial fluid collection. Vascular: No hyperdense vessel. Skull: Scalp soft tissues and calvarium are unchanged with no new abnormality. Sinuses: Clear. Orbits: Unremarkable. Review of the MIP images confirms the above findings CTA NECK FINDINGS Aortic arch: Aortic arch and origin of the great vessels not visualized on this exam. Right carotid system: Visualized right CCA patent to the bifurcation without stenosis. Eccentric bulky calcified plaque at the right carotid bulb/proximal right ICA with associated stenosis of up to 50% by NASCET criteria. Right ICA patent distally without  stenosis. Mild irregularity involving the mid-distal right ICA favored to be atherosclerotic in nature, although changes of mild FMD could be considered. Left carotid system: Visualized left CCA patent to the bifurcation without stenosis. Bulky eccentric plaque at the left carotid bulb with mild 30% stenosis by NASCET criteria. Multifocal irregularity of the mid-distal left ICA favored to be atherosclerotic in nature, although changes of FMD could again be considered. Vertebral arteries: Both vertebral arteries arise from the subclavian arteries. Atheromatous plaque at the origin of the left vertebral artery with moderate stenosis. Vertebral arteries otherwise patent within the neck without stenosis or other abnormality. Skeleton: No visible acute osseous finding. No worrisome osseous lesions. Moderate spondylosis at C5-6 and C6-7 with grade 1 anterolisthesis of C7 on T1. Other neck: No other acute soft tissue abnormality within the neck. No mass or adenopathy. 4 mm left thyroid nodule, of doubtful significance given size and patient age, no follow-up imaging recommended (ref: J Am Coll Radiol. 2015 Feb;12(2): 143-50). Upper chest: Visualized lung apices are clear. Visualized upper esophagus is patulous. Review of the MIP images confirms the above findings CTA HEAD FINDINGS Anterior circulation: Petrous segments patent bilaterally. Atheromatous change within the carotid siphons without hemodynamically significant stenosis. 2 mm focal outpouching at the expected takeoff of the left PCOM noted, which could reflect a small vascular infundibulum related to a hypoplastic left posterior communicating artery versus a small aneurysm (series 10, image 107). A1 segments patent bilaterally. Normal anterior communicating artery complex. Anterior cerebral arteries patent their distal aspects without stenosis. No M1 stenosis or occlusion. Normal MCA bifurcations. Distal MCA branches well perfused and symmetric. Posterior  circulation: Both V4 segments patent to the vertebrobasilar junction without stenosis. Both PICA origins patent and normal. Basilar patent to its distal aspect without stenosis. Superior cerebellar arteries patent bilaterally. Both PCAs patent and well perfused to their distal aspects. Venous sinuses: Not well assessed due to timing the contrast bolus. Anatomic variants: None significant. Review of the MIP images confirms the above findings IMPRESSION: CT HEAD IMPRESSION: 1. Stable head CT.  No acute intracranial abnormality. 2. Atrophy with moderate chronic microvascular ischemic disease. CTA HEAD AND NECK IMPRESSION: 1. Negative CTA for large vessel occlusion. 2. Atheromatous change about the carotid bifurcations with associated 50% stenosis on the right and 30% stenosis on the left. 3. Moderate stenosis at the origin of the left vertebral artery. Otherwise wide patency of the posterior circulation.  4. 2 mm focal outpouching at the expected takeoff of the left PCOM, which could reflect a small vascular infundibulum versus a small aneurysm. Attention at follow-up recommended. 5. Mild irregularity involving the mid-distal ICAs bilaterally, favored to be atherosclerotic in nature, although changes of mild FMD could be considered. Electronically Signed   By: Rise Mu M.D.   On: 09/23/2020 03:36   MR BRAIN WO CONTRAST  Result Date: 09/22/2020 CLINICAL DATA:  Transient ischemic attack (TIA) EXAM: MRI HEAD WITHOUT CONTRAST TECHNIQUE: Multiplanar, multiecho pulse sequences of the brain and surrounding structures were obtained without intravenous contrast. COMPARISON:  MRI 09/11/2012.  CT head from the same day. FINDINGS: Brain: No acute infarction, hemorrhage, hydrocephalus, extra-axial collection or mass lesion. Moderate atrophy with ex vacuo ventricular dilation. Moderate patchy T2 hyperintensity within the supratentorial and pontine white matter, nonspecific but compatible with chronic microvascular  ischemic disease. Vascular: Major arterial flow voids are maintained at the skull base. Skull and upper cervical spine: Normal marrow signal. Sinuses/Orbits: Clear sinuses.  Unremarkable orbits. Other: No mastoid effusions. IMPRESSION: 1. No evidence of acute intracranial abnormality. 2. Moderate atrophy and chronic microvascular ischemic disease. Electronically Signed   By: Feliberto Harts M.D.   On: 09/22/2020 20:24   ECHOCARDIOGRAM LIMITED  Result Date: 09/23/2020    ECHOCARDIOGRAM LIMITED REPORT   Patient Name:   MARTICIA REIFSCHNEIDER Date of Exam: 09/23/2020 Medical Rec #:  [id number]    Height:       65.0 in Accession #:    [id number]   Weight:       122.0 lb Date of Birth:  10-14-1929    BSA:          1.603 m Patient Age:    87 years     BP:           136/51 mmHg Patient Gender: F            HR:           56 bpm. Exam Location:  Inpatient Procedure: Limited Color Doppler, Cardiac Doppler and Limited Echo Indications:    TIA  History:        Patient has prior history of Echocardiogram examinations, most                 recent 08/22/2020. CHF, chronic kidney disease.; Risk                 Factors:Hypertension.  Sonographer:    Delcie Roch RDCS Referring Phys: [id number] TIMOTHY S OPYD IMPRESSIONS  1. Left ventricular ejection fraction, by estimation, is 60 to 65%. The left ventricle has normal function. The left ventricle has no regional wall motion abnormalities. Left ventricular diastolic parameters are consistent with Grade I diastolic dysfunction (impaired relaxation).  2. Right ventricular systolic function is normal. The right ventricular size is mildly enlarged. There is normal pulmonary artery systolic pressure.  3. The mitral valve is normal in structure. Trivial mitral valve regurgitation. No evidence of mitral stenosis.  4. The aortic valve is tricuspid. Aortic valve regurgitation is trivial. No aortic stenosis is present.  5. The inferior vena cava is normal in size with greater than 50% respiratory  variability, suggesting right atrial pressure of 3 mmHg. FINDINGS  Left Ventricle: Left ventricular ejection fraction, by estimation, is 60 to 65%. The left ventricle has normal function. The left ventricle has no regional wall motion abnormalities. The left ventricular internal cavity size was normal in size. There is  no left ventricular hypertrophy. Left  ventricular diastolic parameters are consistent with Grade I diastolic dysfunction (impaired relaxation). Indeterminate filling pressures. Right Ventricle: The right ventricular size is mildly enlarged. No increase in right ventricular wall thickness. Right ventricular systolic function is normal. There is normal pulmonary artery systolic pressure. The tricuspid regurgitant velocity is 2.82  m/s, and with an assumed right atrial pressure of 3 mmHg, the estimated right ventricular systolic pressure is 34.8 mmHg. Left Atrium: Left atrial size was normal in size. Right Atrium: Right atrial size was normal in size. Pericardium: There is no evidence of pericardial effusion. Mitral Valve: The mitral valve is normal in structure. Trivial mitral valve regurgitation. No evidence of mitral valve stenosis. Tricuspid Valve: The tricuspid valve is normal in structure. Tricuspid valve regurgitation is mild . No evidence of tricuspid stenosis. Aortic Valve: The aortic valve is tricuspid. Aortic valve regurgitation is trivial. No aortic stenosis is present. Pulmonic Valve: The pulmonic valve was normal in structure. Pulmonic valve regurgitation is not visualized. No evidence of pulmonic stenosis. Aorta: The aortic root is normal in size and structure. Venous: The inferior vena cava is normal in size with greater than 50% respiratory variability, suggesting right atrial pressure of 3 mmHg. IAS/Shunts: No atrial level shunt detected by color flow Doppler. LEFT VENTRICLE PLAX 2D LVIDd:         4.00 cm Diastology LVIDs:         2.70 cm LV e' medial:    7.72 cm/s LV PW:         0.80  cm LV E/e' medial:  11.7 LV IVS:        0.70 cm LV e' lateral:   7.72 cm/s                        LV E/e' lateral: 11.7  IVC IVC diam: 1.00 cm LEFT ATRIUM         Index LA diam:    3.10 cm 1.93 cm/m  AORTIC VALVE LVOT Vmax:   85.90 cm/s LVOT Vmean:  56.200 cm/s LVOT VTI:    0.206 m MITRAL VALVE               TRICUSPID VALVE MV Area (PHT): 2.91 cm    TV Peak grad:   25.4 mmHg MV Decel Time: 261 msec    TV Vmax:        2.52 m/s MV E velocity: 90.00 cm/s  TR Peak grad:   31.8 mmHg MV A velocity: 91.30 cm/s  TR Vmax:        282.00 cm/s MV E/A ratio:  0.99                            SHUNTS                            Systemic VTI: 0.21 m Chilton Siiffany Kathryn MD Electronically signed by Chilton Siiffany [place] MD Signature Date/Time: 09/23/2020/1:34:35 PM    Final     Microbiology: Recent Results (from the past 240 hour(s))  Resp Panel by RT-PCR (Flu A&B, Covid) Nasopharyngeal Swab     Status: None   Collection Time: 09/22/20  5:55 PM   Specimen: Nasopharyngeal Swab; Nasopharyngeal(NP) swabs in vial transport medium  Result Value Ref Range Status   SARS Coronavirus 2 by RT PCR NEGATIVE NEGATIVE Final    Comment: (NOTE) SARS-CoV-2 target nucleic acids are NOT DETECTED.  The SARS-CoV-2 RNA is generally detectable in upper respiratory specimens during the acute phase of infection. The lowest concentration of SARS-CoV-2 viral copies this assay can detect is 138 copies/mL. A negative result does not preclude SARS-Cov-2 infection and should not be used as the sole basis for treatment or other patient management decisions. A negative result may occur with  improper specimen collection/handling, submission of specimen other than nasopharyngeal swab, presence of viral mutation(s) within the areas targeted by this assay, and inadequate number of viral copies(<138 copies/mL). A negative result must be combined with clinical observations, patient history, and epidemiological information. The expected result is  Negative.  Fact Sheet for Patients:  BloggerCourse.com  Fact Sheet for Healthcare Providers:  SeriousBroker.it  This test is no t yet approved or cleared by the [place] FDA and  has been authorized for detection and/or diagnosis of SARS-CoV-2 by FDA under an Emergency Use Authorization (EUA). This EUA will remain  in effect (meaning this test can be used) for the duration of the COVID-19 declaration under Section 564(b)(1) of the Act, 21 U.S.C.section 360bbb-3(b)(1), unless the authorization is terminated  or revoked sooner.       Influenza A by PCR NEGATIVE NEGATIVE Final   Influenza B by PCR NEGATIVE NEGATIVE Final    Comment: (NOTE) The Xpert Xpress SARS-CoV-2/FLU/RSV plus assay is intended as an aid in the diagnosis of influenza from Nasopharyngeal swab specimens and should not be used as a sole basis for treatment. Nasal washings and aspirates are unacceptable for Xpert Xpress SARS-CoV-2/FLU/RSV testing.  Fact Sheet for Patients: BloggerCourse.com  Fact Sheet for Healthcare Providers: SeriousBroker.it  This test is not yet approved or cleared by the [place] FDA and has been authorized for detection and/or diagnosis of SARS-CoV-2 by FDA under an Emergency Use Authorization (EUA). This EUA will remain in effect (meaning this test can be used) for the duration of the COVID-19 declaration under Section 564(b)(1) of the Act, 21 U.S.C. section 360bbb-3(b)(1), unless the authorization is terminated or revoked.  Performed at [facility], [address] [place], [place] [address]      Labs: Basic Metabolic Panel: Recent Labs  Lab 09/22/20 1657 09/23/20 0552  NA 133* 136  K 4.3 4.5  CL 97* 102  CO2 26 26  GLUCOSE 89 93  BUN 17 15  CREATININE 1.21* 1.07*  CALCIUM 9.2 8.6*   Liver Function Tests: Recent Labs  Lab 09/22/20 1657  AST 19  ALT 14   ALKPHOS 74  BILITOT 0.6  PROT 7.2  ALBUMIN 3.5   No results for input(s): LIPASE, AMYLASE in the last 168 hours. No results for input(s): AMMONIA in the last 168 hours. CBC: Recent Labs  Lab 09/22/20 1657 09/23/20 0552  WBC 7.4 5.9  NEUTROABS 4.6  --   HGB 11.8* 11.9*  HCT 36.6 36.3  MCV 94.6 92.6  PLT 172 184   Cardiac Enzymes: No results for input(s): CKTOTAL, CKMB, CKMBINDEX, TROPONINI in the last 168 hours. BNP: BNP (last 3 results) Recent Labs    08/20/20 1005  BNP 252.2*    ProBNP (last 3 results) No results for input(s): PROBNP in the last 8760 hours.  CBG: No results for input(s): GLUCAP in the last 168 hours.     Signed:  Darlin Drop, MD [facility] 09/23/2020, 4:39 PM

## 2020-09-23 NOTE — Progress Notes (Signed)
Discussed antithrombotic agent with Dr. Gerilyn Pilgrim, recommended aspirin 325 mg daily.

## 2020-09-23 NOTE — Progress Notes (Signed)
The patient is admitted from ER to 5 C room 12. A & O x 4. Denied any acute pain at this time. Full assessment to epic completed. Patient voiced no concern. Will continue to monitor.

## 2020-09-24 NOTE — Progress Notes (Signed)
Discharge instructions reviewed and given to pt and pt daughter, pt/daughter acknowledge understanding.

## 2020-09-26 ENCOUNTER — Encounter: Payer: Self-pay | Admitting: Neurology

## 2020-10-12 NOTE — Progress Notes (Deleted)
NEUROLOGY CONSULTATION NOTE  Samantha Crane MRN: [id number] DOB: November 20, 1929  Referring provider: Dow Adolph, DO Primary care provider: Wilhelmina Mcardle, MD  Reason for consult:  TIA  Assessment/Plan:   Transient ischemic attack, possibly right hemispheric Hypertension Hyperlipidemia  1  Secondary stroke prevention as managed by PCP:  - ASA 81mg  daily  - Statin.  LDL goal less than 70  - Hgb A1c goal less than 7  - Normotensive blood pressure 2  Follow up in 6 months.   Subjective:  Samantha Crane is an 87 year old ***-handed female with HTN, HLD, and orthostatic tremor who presents for transient ischemic attack.  History supplemented by hospital records.  On 09/22/2020, she developed generalized weakness, predominantly on the left side as well as left sided facial droop.  Symptoms resolved prior to arrival at the ED.  She was admitted for TIA/stroke workup.  CT and MRI of brain without contrast personally reviewed were negative for acute stroke.  CTA of head and neck personally reviewed showed atheromatous change with 50% right carotid bifurcation stenosis and 30% left carotid bifurcation stenosis as well as moderate stenosis at origin of left vertebral artery but no LVO.  Incidental 2 mm infundibulum vs small aneurysm at takeoff of left PCOM noted.  2D echocardiogram showed EF *** with no cardiac source of emboli.  LDL was 99 and Hgb A1c 5.3.  She was not on antithrombotic therapy prior to admission.  She was discharged on ASA 81mg  daily and continued on atorvastatin 10mg .       PAST MEDICAL HISTORY: Past Medical History:  Diagnosis Date   Anxiety    Chronic kidney disease    pt denies, states chronic bladder infections   Depression    Hyperlipidemia    Hypertension    IBS (irritable bowel syndrome)    Orthostatic tremor    Vitamin D deficiency     PAST SURGICAL HISTORY: Past Surgical History:  Procedure Laterality Date   lumbar spinal stenosis       MEDICATIONS: Current Outpatient Medications on File Prior to Visit  Medication Sig Dispense Refill   acetaminophen (TYLENOL) 325 MG tablet Take 650 mg by mouth every 4 (four) hours as needed (elevated temperature/generalized discomfort).     albuterol (PROVENTIL HFA;VENTOLIN HFA) 108 (90 Base) MCG/ACT inhaler Inhale 2 puffs into the lungs every 6 (six) hours as needed for wheezing or shortness of breath.     aspirin 325 MG tablet Take 1 tablet (325 mg total) by mouth daily. 90 tablet 0   atorvastatin (LIPITOR) 40 MG tablet Take 1 tablet (40 mg total) by mouth at bedtime. 90 tablet 0   benzocaine-menthol (CHLORASEPTIC) 6-10 MG lozenge Take 1 lozenge by mouth 3 (three) times daily as needed for sore throat.     buPROPion (WELLBUTRIN SR) 150 MG 12 hr tablet Take 150 mg by mouth 2 (two) times daily.     CALCIUM 600/VITAMIN D3 600-800 MG-UNIT TABS Take 1 tablet by mouth 2 (two) times daily.     calcium carbonate (TUMS - DOSED IN MG ELEMENTAL CALCIUM) 500 MG chewable tablet Chew 1,000 mg by mouth every 4 (four) hours as needed (gas pain).     cetirizine (ZYRTEC) 5 MG tablet Take 5 mg by mouth every morning.     clonazePAM (KLONOPIN) 0.5 MG tablet Take 0.5 tablets (0.25 mg total) by mouth See admin instructions for 4 doses. 0.25mg  oral three times daily And 0.25mg  oral twice daily as needed for anxiety (Patient taking differently:  Take 0.25 mg by mouth 3 (three) times daily.) 4 tablet 0   conjugated estrogens (PREMARIN) vaginal cream Place 1 applicator vaginally every Monday, Wednesday, and Friday. At bedtime     docusate sodium (COLACE) 100 MG capsule Take 100 mg by mouth 2 (two) times daily.     ferrous sulfate 325 (65 FE) MG tablet Take 325 mg by mouth every morning.     fluticasone (FLONASE) 50 MCG/ACT nasal spray Place 2 sprays into both nostrils daily as needed for allergies or rhinitis. (Patient taking differently: Place 1 spray into both nostrils at bedtime.) 16 g 3   gabapentin (NEURONTIN)  300 MG capsule Take 300 mg by mouth every morning.     guaiFENesin (MUCINEX) 600 MG 12 hr tablet Take 600 mg by mouth 2 (two) times daily as needed for cough.     lisinopril (PRINIVIL,ZESTRIL) 20 MG tablet Take 20 mg by mouth every morning.     loperamide (IMODIUM A-D) 2 MG tablet Take 2 mg by mouth daily as needed for diarrhea or loose stools.     mirtazapine (REMERON) 15 MG tablet Take 15 mg by mouth at bedtime.      Multiple Vitamin (MULTIVITAMIN WITH MINERALS) TABS tablet Take 1 tablet by mouth every morning.     nitrofurantoin (MACRODANTIN) 100 MG capsule Take 100 mg by mouth 2 (two) times daily.     ondansetron (ZOFRAN) 4 MG tablet Take 4-8 mg by mouth See admin instructions. Take one tablet (4 mg) by mouth every 8 hours as needed for nausea/vomiting; if not effective after 2 doses increase to 8 mg.     pantoprazole (PROTONIX) 40 MG tablet Take 40 mg by mouth every morning.     polyethylene glycol (MIRALAX / GLYCOLAX) 17 g packet Take 17 g by mouth daily as needed (constipation). Stir and dissolve in 4-8 oz gatorade/beverage     Probiotic Product (BACID) CAPS Take 1 capsule by mouth 2 (two) times daily. Bacid with Lactospore 1 billion cell     sertraline (ZOLOFT) 100 MG tablet Take 100 mg by mouth every morning.     sodium chloride (OCEAN) 0.65 % SOLN nasal spray Place 1 spray into both nostrils every morning.     valACYclovir (VALTREX) 1000 MG tablet Take 1 g by mouth 3 (three) times daily as needed (cold sores).     zaleplon (SONATA) 10 MG capsule Take 10 mg by mouth at bedtime as needed for sleep.     No current facility-administered medications on file prior to visit.    ALLERGIES: Allergies  Allergen Reactions   Prednisone Other (See Comments)     Manic tendencies   Amlodipine Swelling   Amoxicillin Other (See Comments)    Unknown reaction   Amoxicillin-Pot Clavulanate Diarrhea    Tolerated Unasyn 08/2020   Lactose Intolerance (Gi) Other (See Comments)    Unknown reaction    Limonene Diarrhea   Sulfa Antibiotics Other (See Comments)    Unknown reaction    FAMILY HISTORY: Family History  Problem Relation Age of Onset   Heart attack Father    Stroke Mother    Stroke Brother    Stroke Paternal Aunt    Allergic rhinitis Neg Hx    Angioedema Neg Hx    Asthma Neg Hx    Immunodeficiency Neg Hx    Eczema Neg Hx    Urticaria Neg Hx     Objective:  *** General: No acute distress.  Patient appears well-groomed.   Head:  Normocephalic/atraumatic Eyes:  fundi examined but not visualized Neck: supple, no paraspinal tenderness, full range of motion Back: No paraspinal tenderness Heart: regular rate and rhythm Lungs: Clear to auscultation bilaterally. Vascular: No carotid bruits. Neurological Exam: Mental status: alert and oriented to person, place, and time, recent and remote memory intact, fund of knowledge intact, attention and concentration intact, speech fluent and not dysarthric, language intact. Cranial nerves: CN I: not tested CN II: pupils equal, round and reactive to light, visual fields intact CN III, IV, VI:  full range of motion, no nystagmus, no ptosis CN V: facial sensation intact. CN VII: upper and lower face symmetric CN VIII: hearing intact CN IX, X: gag intact, uvula midline CN XI: sternocleidomastoid and trapezius muscles intact CN XII: tongue midline Bulk & Tone: normal, no fasciculations. Motor:  muscle strength 5/5 throughout Sensation:  Pinprick, temperature and vibratory sensation intact. Deep Tendon Reflexes:  2+ throughout,  toes downgoing.   Finger to nose testing:  Without dysmetria.   Heel to shin:  Without dysmetria.   Gait:  Normal station and stride.  Romberg negative.    Thank you for allowing me to take part in the care of this patient.  Shon Millet, DO  CC: ***

## 2020-10-13 ENCOUNTER — Ambulatory Visit: Payer: Medicare Other | Admitting: Neurology

## 2020-10-25 NOTE — Progress Notes (Signed)
NEUROLOGY CONSULTATION NOTE  Samantha Crane MRN: [id number] DOB: February 28, 1929  Referring provider: Dow Adolph, DO (hospital referral) Primary care provider: Wilhelmina Mcardle, MD  Reason for consult:  transient ischemic attack  Assessment/Plan:   My suspicion is that she did not have a TIA but rather an anxiety attack.  The only information of left-sided weakness was hearsay.  Nobody who examined the patient in the hospital appreciated any focal/lateralizing weakness.  Patient herself did not appreciate experiencing unilateral weakness.  She has known history of tremor which was likely exacerbated by her anxiety.  That being said, it does not change management for secondary stroke prevention, as she does have prior history of TIA - ASA, optimize blood pressure control, statin with LDL goal less than 70 I would recommend that her PCP re-evaluate management of her anxiety I would like to monitor and have her follow up in 6 months.    Subjective:  Samantha Crane is an 87 year old female with HTN, CHF, CKD, depression and HLD who presents for transient ischemic attack.  History supplemented by hospital records.  She is accompanied by her daughter who provides collateral history.  CT head, CTA head and neck and MRI of brain from hospital personally reviewed.  Her daughter, Samantha Crane, accompanied for visit via phone.  In September, patient was residing in SNF following hospitalization for cellulitis.  On 9/10, she didn't quite feel right.  Earlier in the day, she was dropping objects and had difficulty putting on her makeup.  She later became tremulous and started exhibiting generalized shaking.  She has a history of anxiety and easily becomes overwhelmed.  When she wasn't feeling right, she became becoming fearful that she was going to have a stroke (she has longstanding fear of having a stroke because a nurse once told her that she was eventually going to die from a stroke due to strong family history).   EMS was called and she was taken to the hospital.  As per history relayed to the providers in the hospital, she appeared to have left sided weakness.  However, she had no lateralizing symptoms on exam once she was in the hospital.  She was admitted for stroke workup.  Labs, including CBC and UA, were negative for infection.  However, she did have an elevated Cr 1.21 and GFR 43 (0.78 and >60 the previous month).  CT head and follow up MRI of brain showed moderate atrophy and chronic small vessel ischemic changes but no acute intracranial abnormality.  CTA of head and neck showed atheromatous changes at the carotid bifurcations (50% stenosis on right and 30% stenosis on left) but no LVO.  Incidental 2 mm infundibulum vs small aneurysm noted at takeoff of left PCOM.  LDL was 99 and Hgb A1c was 5.3.  She was diagnosed with probable right-hemispheric TIA.  She was not on antithrombotic therapy and was started on ASA 325mg  daily.  Atorvastatin was increased from 10mg  to 40mg  daily.  She had a couple of other episodes of shaking since then.  Lasts about 20 minutes.  No loss of consciousness but she is fatigued for a couple of hours afterwards.  They occurred during time of increased emotional stress.  She reports fear of having a stroke because years ago a nurse told her that she was likely going to die of a stroke because she had so many family members, including her mother and brother, who had strokes.  She reports that she did have a TIA many years  ago presenting as left sided weakness.  PAST MEDICAL HISTORY: Past Medical History:  Diagnosis Date   Anxiety    Chronic kidney disease    pt denies, states chronic bladder infections   Depression    Hyperlipidemia    Hypertension    IBS (irritable bowel syndrome)    Orthostatic tremor    Vitamin D deficiency     PAST SURGICAL HISTORY: Past Surgical History:  Procedure Laterality Date   lumbar spinal stenosis      MEDICATIONS: Current Outpatient  Medications on File Prior to Visit  Medication Sig Dispense Refill   acetaminophen (TYLENOL) 325 MG tablet Take 650 mg by mouth every 4 (four) hours as needed (elevated temperature/generalized discomfort).     albuterol (PROVENTIL HFA;VENTOLIN HFA) 108 (90 Base) MCG/ACT inhaler Inhale 2 puffs into the lungs every 6 (six) hours as needed for wheezing or shortness of breath.     aspirin 325 MG tablet Take 1 tablet (325 mg total) by mouth daily. 90 tablet 0   atorvastatin (LIPITOR) 40 MG tablet Take 1 tablet (40 mg total) by mouth at bedtime. 90 tablet 0   benzocaine-menthol (CHLORASEPTIC) 6-10 MG lozenge Take 1 lozenge by mouth 3 (three) times daily as needed for sore throat.     buPROPion (WELLBUTRIN SR) 150 MG 12 hr tablet Take 150 mg by mouth 2 (two) times daily.     CALCIUM 600/VITAMIN D3 600-800 MG-UNIT TABS Take 1 tablet by mouth 2 (two) times daily.     calcium carbonate (TUMS - DOSED IN MG ELEMENTAL CALCIUM) 500 MG chewable tablet Chew 1,000 mg by mouth every 4 (four) hours as needed (gas pain).     cetirizine (ZYRTEC) 5 MG tablet Take 5 mg by mouth every morning.     clonazePAM (KLONOPIN) 0.5 MG tablet Take 0.5 tablets (0.25 mg total) by mouth See admin instructions for 4 doses. 0.25mg  oral three times daily And 0.25mg  oral twice daily as needed for anxiety (Patient taking differently: Take 0.25 mg by mouth 3 (three) times daily.) 4 tablet 0   conjugated estrogens (PREMARIN) vaginal cream Place 1 applicator vaginally every Monday, Wednesday, and Friday. At bedtime     docusate sodium (COLACE) 100 MG capsule Take 100 mg by mouth 2 (two) times daily.     ferrous sulfate 325 (65 FE) MG tablet Take 325 mg by mouth every morning.     fluticasone (FLONASE) 50 MCG/ACT nasal spray Place 2 sprays into both nostrils daily as needed for allergies or rhinitis. (Patient taking differently: Place 1 spray into both nostrils at bedtime.) 16 g 3   gabapentin (NEURONTIN) 300 MG capsule Take 300 mg by mouth  every morning.     guaiFENesin (MUCINEX) 600 MG 12 hr tablet Take 600 mg by mouth 2 (two) times daily as needed for cough.     lisinopril (PRINIVIL,ZESTRIL) 20 MG tablet Take 20 mg by mouth every morning.     loperamide (IMODIUM A-D) 2 MG tablet Take 2 mg by mouth daily as needed for diarrhea or loose stools.     mirtazapine (REMERON) 15 MG tablet Take 15 mg by mouth at bedtime.      Multiple Vitamin (MULTIVITAMIN WITH MINERALS) TABS tablet Take 1 tablet by mouth every morning.     nitrofurantoin (MACRODANTIN) 100 MG capsule Take 100 mg by mouth 2 (two) times daily.     ondansetron (ZOFRAN) 4 MG tablet Take 4-8 mg by mouth See admin instructions. Take one tablet (4 mg) by mouth  every 8 hours as needed for nausea/vomiting; if not effective after 2 doses increase to 8 mg.     pantoprazole (PROTONIX) 40 MG tablet Take 40 mg by mouth every morning.     polyethylene glycol (MIRALAX / GLYCOLAX) 17 g packet Take 17 g by mouth daily as needed (constipation). Stir and dissolve in 4-8 oz gatorade/beverage     Probiotic Product (BACID) CAPS Take 1 capsule by mouth 2 (two) times daily. Bacid with Lactospore 1 billion cell     sertraline (ZOLOFT) 100 MG tablet Take 100 mg by mouth every morning.     sodium chloride (OCEAN) 0.65 % SOLN nasal spray Place 1 spray into both nostrils every morning.     valACYclovir (VALTREX) 1000 MG tablet Take 1 g by mouth 3 (three) times daily as needed (cold sores).     zaleplon (SONATA) 10 MG capsule Take 10 mg by mouth at bedtime as needed for sleep.     No current facility-administered medications on file prior to visit.    ALLERGIES: Allergies  Allergen Reactions   Prednisone Other (See Comments)     Manic tendencies   Amlodipine Swelling   Amoxicillin Other (See Comments)    Unknown reaction   Amoxicillin-Pot Clavulanate Diarrhea    Tolerated Unasyn 08/2020   Lactose Intolerance (Gi) Other (See Comments)    Unknown reaction   Limonene Diarrhea   Sulfa  Antibiotics Other (See Comments)    Unknown reaction    FAMILY HISTORY: Family History  Problem Relation Age of Onset   Heart attack Father    Stroke Mother    Stroke Brother    Stroke Paternal Aunt    Allergic rhinitis Neg Hx    Angioedema Neg Hx    Asthma Neg Hx    Immunodeficiency Neg Hx    Eczema Neg Hx    Urticaria Neg Hx     Objective:  Blood pressure (!) 188/61, height 5\' 4"  (1.626 m), weight 129 lb (58.5 kg). General: No acute distress.  Patient appears well-groomed.   Head:  Normocephalic/atraumatic Eyes:  fundi examined but not visualized Neck: supple, no paraspinal tenderness, full range of motion Back: No paraspinal tenderness Heart: regular rate and rhythm Lungs: Clear to auscultation bilaterally. Vascular: No carotid bruits. Neurological Exam: Mental status: alert and oriented to person, place, and time, recent and remote memory intact, fund of knowledge intact, attention and concentration intact, speech fluent and not dysarthric, language intact. Cranial nerves: CN I: not tested CN II: pupils equal, round and reactive to light, visual fields intact CN III, IV, VI:  full range of motion, no nystagmus, no ptosis CN V: facial sensation intact. CN VII: upper and lower face symmetric CN VIII: hearing intact CN IX, X: gag intact, uvula midline CN XI: sternocleidomastoid and trapezius muscles intact CN XII: tongue midline Bulk & Tone: normal, no fasciculations. Motor:  muscle strength 5/5 throughout Sensation:  Pinprick, temperature and vibratory sensation intact. Deep Tendon Reflexes:  2+ throughout,  toes downgoing.   Finger to nose testing:  Without dysmetria.   Heel to shin:  Without dysmetria.   Gait:  Ambulates with walker, broad-based gait.  Romberg negative.    Thank you for allowing me to take part in the care of this patient.  , DO  CC: Shon Millet, MD

## 2020-10-26 ENCOUNTER — Encounter: Payer: Self-pay | Admitting: Neurology

## 2020-10-26 ENCOUNTER — Ambulatory Visit (INDEPENDENT_AMBULATORY_CARE_PROVIDER_SITE_OTHER): Payer: Medicare Other | Admitting: Neurology

## 2020-10-26 ENCOUNTER — Other Ambulatory Visit: Payer: Self-pay

## 2020-10-26 VITALS — BP 188/61 | Ht 64.0 in | Wt 129.0 lb

## 2020-10-26 DIAGNOSIS — I10 Essential (primary) hypertension: Secondary | ICD-10-CM

## 2020-10-26 DIAGNOSIS — F410 Panic disorder [episodic paroxysmal anxiety] without agoraphobia: Secondary | ICD-10-CM

## 2020-10-26 NOTE — Patient Instructions (Signed)
I think that you had an anxiety attack, not a TIA.  That does not change management, however.  Goal is to prevent another TIA or stroke as you do have a history of TIA  Follow up in 6 months

## 2020-12-22 ENCOUNTER — Emergency Department (HOSPITAL_COMMUNITY)
Admission: EM | Admit: 2020-12-22 | Discharge: 2020-12-23 | Disposition: A | Discharge status: Home / Self Care | Payer: Medicare Other | Attending: Emergency Medicine | Admitting: Emergency Medicine

## 2020-12-22 ENCOUNTER — Emergency Department (HOSPITAL_COMMUNITY): Payer: Medicare Other

## 2020-12-22 ENCOUNTER — Other Ambulatory Visit: Payer: Self-pay

## 2020-12-22 ENCOUNTER — Encounter (HOSPITAL_COMMUNITY): Payer: Self-pay

## 2020-12-22 DIAGNOSIS — Z20822 Contact with and (suspected) exposure to covid-19: Secondary | ICD-10-CM | POA: Diagnosis not present

## 2020-12-22 DIAGNOSIS — Z7982 Long term (current) use of aspirin: Secondary | ICD-10-CM | POA: Insufficient documentation

## 2020-12-22 DIAGNOSIS — I130 Hypertensive heart and chronic kidney disease with heart failure and stage 1 through stage 4 chronic kidney disease, or unspecified chronic kidney disease: Secondary | ICD-10-CM | POA: Diagnosis not present

## 2020-12-22 DIAGNOSIS — S8991XA Unspecified injury of right lower leg, initial encounter: Secondary | ICD-10-CM | POA: Diagnosis present

## 2020-12-22 DIAGNOSIS — Z79899 Other long term (current) drug therapy: Secondary | ICD-10-CM | POA: Insufficient documentation

## 2020-12-22 DIAGNOSIS — Z87891 Personal history of nicotine dependence: Secondary | ICD-10-CM | POA: Diagnosis not present

## 2020-12-22 DIAGNOSIS — I5032 Chronic diastolic (congestive) heart failure: Secondary | ICD-10-CM | POA: Diagnosis not present

## 2020-12-22 DIAGNOSIS — M79604 Pain in right leg: Secondary | ICD-10-CM | POA: Diagnosis not present

## 2020-12-22 DIAGNOSIS — N1831 Chronic kidney disease, stage 3a: Secondary | ICD-10-CM | POA: Insufficient documentation

## 2020-12-22 DIAGNOSIS — S72401A Unspecified fracture of lower end of right femur, initial encounter for closed fracture: Secondary | ICD-10-CM | POA: Insufficient documentation

## 2020-12-22 DIAGNOSIS — S82024A Nondisplaced longitudinal fracture of right patella, initial encounter for closed fracture: Secondary | ICD-10-CM

## 2020-12-22 DIAGNOSIS — W1839XA Other fall on same level, initial encounter: Secondary | ICD-10-CM | POA: Diagnosis not present

## 2020-12-22 DIAGNOSIS — W19XXXA Unspecified fall, initial encounter: Secondary | ICD-10-CM

## 2020-12-22 LAB — TYPE AND SCREEN
ABO/RH(D): O POS
Antibody Screen: NEGATIVE

## 2020-12-22 LAB — COMPREHENSIVE METABOLIC PANEL
ALT: 23 U/L (ref 0–44)
AST: 25 U/L (ref 15–41)
Albumin: 3.8 g/dL (ref 3.5–5.0)
Alkaline Phosphatase: 82 U/L (ref 38–126)
Anion gap: 8 (ref 5–15)
BUN: 26 mg/dL — ABNORMAL HIGH (ref 8–23)
CO2: 26 mmol/L (ref 22–32)
Calcium: 8.8 mg/dL — ABNORMAL LOW (ref 8.9–10.3)
Chloride: 97 mmol/L — ABNORMAL LOW (ref 98–111)
Creatinine, Ser: 1 mg/dL (ref 0.44–1.00)
GFR, Estimated: 54 mL/min — ABNORMAL LOW (ref >60)
Glucose, Bld: 94 mg/dL (ref 70–99)
Potassium: 4.3 mmol/L (ref 3.5–5.1)
Sodium: 131 mmol/L — ABNORMAL LOW (ref 135–145)
Total Bilirubin: 0.6 mg/dL (ref 0.3–1.2)
Total Protein: 7.6 g/dL (ref 6.5–8.1)

## 2020-12-22 LAB — CBC
HCT: 38.1 % (ref 36.0–46.0)
Hemoglobin: 12.2 g/dL (ref 12.0–15.0)
MCH: 30.2 pg (ref 26.0–34.0)
MCHC: 32 g/dL (ref 30.0–36.0)
MCV: 94.3 fL (ref 80.0–100.0)
Platelets: 177 10*3/uL (ref 150–400)
RBC: 4.04 MIL/uL (ref 3.87–5.11)
RDW: 13.5 % (ref 11.5–15.5)
WBC: 8.5 10*3/uL (ref 4.0–10.5)
nRBC: 0 % (ref 0.0–0.2)

## 2020-12-22 LAB — RESP PANEL BY RT-PCR (FLU A&B, COVID) ARPGX2
Influenza A by PCR: NEGATIVE
Influenza B by PCR: NEGATIVE
SARS Coronavirus 2 by RT PCR: NEGATIVE

## 2020-12-22 NOTE — ED Provider Notes (Signed)
Care assumed from Dr. Audley Hose, patient with pain in the right leg and concern for possible fracture in the distal femur, pending CT report.  CT shows minimally displaced fracture of the lateral edge of the patella.  Knee immobilizer is applied.  She will be referred to orthopedics for follow-up.  Family has requested urinalysis be obtained, she apparently has had problems with frequent UTIs.  Urinalysis does show greater than 50 WBCs as well as bacteria and budding yeast.  Patient has been apparently on continuous nitrofurantoin for suppression of chronic UTIs.  Since she does not have a clinical infection now, I would not give her a new antibiotic prescription.  [facility] Injury Treatment  Date/Time: 12/23/2020 12:52 AM Performed by: Dione Booze, MD Authorized by: Dione Booze, MD   Consent:    Consent obtained:  Verbal   Consent given by:  Guardian   Risks discussed:  Restricted joint movement   Alternatives discussed:  No treatmentInjury location: knee Location details: right knee Injury type: fracture Fracture type: patellar Pre-procedure neurovascular assessment: neurovascularly intact Pre-procedure distal perfusion: normal Pre-procedure neurological function: normal Pre-procedure range of motion: reduced  Anesthesia: Local anesthesia used: no  Patient sedated: NoManipulation performed: no Immobilization: brace Splint Applied by: ED Nurse Supplies used: Knee immobilizer. Post-procedure neurovascular assessment: post-procedure neurovascularly intact Post-procedure distal perfusion: normal Post-procedure neurological function: normal Post-procedure range of motion: unchanged      Dione Booze, MD 12/23/20 [phone]

## 2020-12-22 NOTE — ED Triage Notes (Signed)
Patient BIB [facility] from [facility]. Fall on Monday. Progressively complaining of right leg pain. Had xrays today, right femur fracture confirmed. Pain only when she bears weight on the leg.

## 2020-12-22 NOTE — ED Provider Notes (Signed)
[facility]   CSN: [id number] Arrival date & time: 12/22/20  2042     History Chief Complaint  Patient presents with   Samantha Crane    Samantha RheinBette Crane is a 87 y.o. female.  Patient from an assisted living facility, presents to the ER with a chief complaint of right lower extremity pain.  She thinks the pain somewhere along her knee or hip.  Reportedly had a fall approximately 3 days ago at ground-level.  He had x-rays done today which reported a fracture of the distal femur.  Patient otherwise denies headache or neck pain or back pain denies fevers cough vomiting or diarrhea.      Past Medical History:  Diagnosis Date   Anxiety    Chronic kidney disease    pt denies, states chronic bladder infections   Depression    Hyperlipidemia    Hypertension    IBS (irritable bowel syndrome)    Orthostatic tremor    Vitamin D deficiency     Patient Active Problem List   Diagnosis Date Noted   Weakness 09/22/2020   TIA (transient ischemic attack)    Acute on chronic respiratory failure with hypoxia (HCC) 08/20/2020   Chronic diastolic CHF (congestive heart failure) (HCC) 08/20/2020   Sepsis (HCC) 08/19/2020   Cellulitis of left leg 08/18/2020   Hyponatremia 12/18/2016   Incomplete emptying of bladder 11/15/2016   Prolapse of female genital organs 10/21/2016   Acute lower UTI 10/21/2016   Vaginal atrophy 10/21/2016   Right sciatic nerve pain 07/08/2016   Chronic nonseasonal allergic rhinitis due to pollen 02/09/2016   Environmental allergies 02/09/2016   Allergic rhinitis 11/29/2015   Cough 11/29/2015   History of food allergy 11/29/2015   Primary insomnia 06/05/2015   Subacute maxillary sinusitis 01/06/2015   Chronic diarrhea 06/20/2014   Chronic kidney disease (CKD) stage G3a/A1, moderately decreased glomerular filtration rate (GFR) between 45-59 mL/min/1.73 square meter and albuminuria creatinine ratio less than 30 mg/g (HCC)  11/30/2013   Alopecia 03/17/2013   Dermatitis 12/04/2012   B-complex deficiency 11/19/2012   Essential tremor 11/19/2012   Duodenal ulcer with hemorrhage 05/20/2012   Orthostatic tremor 02/19/2012   Carotid stenosis, bilateral 09/17/2011   Stenosis of carotid artery 09/17/2011   Hyperlipidemia 04/16/2011   Personal history of transient ischemic attack (TIA), and cerebral infarction without residual deficits 03/04/2011   Vitamin D deficiency 09/20/2010   Anxiety 08/08/2010   Essential hypertension 08/08/2010   Irritable bowel syndrome without diarrhea 08/08/2010   Major depressive disorder, single episode 08/08/2010   Spinal stenosis 08/08/2007    Past Surgical History:  Procedure Laterality Date   lumbar spinal stenosis       OB History   No obstetric history on file.     Family History  Problem Relation Age of Onset   Heart attack Father    Stroke Mother    Stroke Brother    Stroke Paternal Aunt    Allergic rhinitis Neg Hx    Angioedema Neg Hx    Asthma Neg Hx    Immunodeficiency Neg Hx    Eczema Neg Hx    Urticaria Neg Hx     Social History   Tobacco Use   Smoking status: Former    Packs/day: 0.25    Types: Cigarettes   Smokeless tobacco: Never   Tobacco comments:    quit 30 years ago; social smoker  Vaping Use   Vaping Use: Never used  Substance Use Topics  Alcohol use: No    Alcohol/week: 0.0 standard drinks    Comment: rarely   Drug use: No    Home Medications Prior to Admission medications   Medication Sig Start Date End Date Taking? Authorizing Provider  buPROPion (WELLBUTRIN SR) 150 MG 12 hr tablet Take 150 mg by mouth 2 (two) times daily. 02/07/19  Yes [provider]  CALCIUM 600/VITAMIN D3 600-800 MG-UNIT TABS Take 1 tablet by mouth 2 (two) times daily. 08/04/20  Yes [provider]  cetirizine (ZYRTEC) 5 MG tablet Take 5 mg by mouth every morning.   Yes [provider]  clonazePAM (KLONOPIN) 0.5 MG tablet Take  0.5 tablets (0.25 mg total) by mouth See admin instructions for 4 doses. 0.25mg  oral three times daily And 0.25mg  oral twice daily as needed for anxiety Patient taking differently: Take 0.25 mg by mouth 3 (three) times daily. 08/23/20  Yes Antonieta Pert, MD  conjugated estrogens (PREMARIN) vaginal cream Place 1 applicator vaginally every Monday, Wednesday, and Friday. At bedtime   Yes [provider]  docusate sodium (COLACE) 100 MG capsule Take 100 mg by mouth 2 (two) times daily.   Yes [provider]  ferrous sulfate 325 (65 FE) MG tablet Take 325 mg by mouth every morning.   Yes [provider]  lisinopril (PRINIVIL,ZESTRIL) 20 MG tablet Take 20 mg by mouth every morning.   Yes [provider]  mirtazapine (REMERON) 15 MG tablet Take 15 mg by mouth at bedtime.    Yes [provider]  Multiple Vitamin (MULTIVITAMIN WITH MINERALS) TABS tablet Take 1 tablet by mouth every morning.   Yes [provider]  nitrofurantoin (MACRODANTIN) 100 MG capsule Take 100 mg by mouth 2 (two) times daily.   Yes [provider]  pantoprazole (PROTONIX) 40 MG tablet Take 40 mg by mouth every morning.   Yes [provider]  Probiotic Product (BACID) CAPS Take 1 capsule by mouth 2 (two) times daily. Bacid with Lactospore 1 billion cell   Yes [provider]  sertraline (ZOLOFT) 100 MG tablet Take 100 mg by mouth every morning.   Yes [provider]  acetaminophen (TYLENOL) 325 MG tablet Take 650 mg by mouth every 4 (four) hours as needed (elevated temperature/generalized discomfort).    [provider]  albuterol (PROVENTIL HFA;VENTOLIN HFA) 108 (90 Base) MCG/ACT inhaler Inhale 2 puffs into the lungs every 6 (six) hours as needed for wheezing or shortness of breath. 11/29/15   [provider]  aspirin 325 MG tablet Take 1 tablet (325 mg total) by mouth daily. 09/23/20 12/22/20  Kayleen Memos, DO  atorvastatin (LIPITOR)  40 MG tablet Take 1 tablet (40 mg total) by mouth at bedtime. 09/23/20 12/22/20  Kayleen Memos, DO  benzocaine-menthol (CHLORASEPTIC) 6-10 MG lozenge Take 1 lozenge by mouth 3 (three) times daily as needed for sore throat.    [provider]  calcium carbonate (TUMS - DOSED IN MG ELEMENTAL CALCIUM) 500 MG chewable tablet Chew 1,000 mg by mouth every 4 (four) hours as needed (gas pain).    [provider]  fluticasone (FLONASE) 50 MCG/ACT nasal spray Place 2 sprays into both nostrils daily as needed for allergies or rhinitis. Patient taking differently: Place 1 spray into both nostrils at bedtime. 08/13/17   Bobbitt, Sedalia Muta, MD  gabapentin (NEURONTIN) 300 MG capsule Take 300 mg by mouth every morning.    [provider]  guaiFENesin (MUCINEX) 600 MG 12 hr tablet Take 600  mg by mouth 2 (two) times daily as needed for cough.    [provider]  loperamide (IMODIUM A-D) 2 MG tablet Take 2 mg by mouth daily as needed for diarrhea or loose stools.    [provider]  ondansetron (ZOFRAN) 4 MG tablet Take 4-8 mg by mouth See admin instructions. Take one tablet (4 mg) by mouth every 8 hours as needed for nausea/vomiting; if not effective after 2 doses increase to 8 mg.    [provider]  polyethylene glycol (MIRALAX / GLYCOLAX) 17 g packet Take 17 g by mouth daily as needed (constipation). Stir and dissolve in 4-8 oz gatorade/beverage    [provider]  sodium chloride (OCEAN) 0.65 % SOLN nasal spray Place 1 spray into both nostrils every morning.    [provider]  valACYclovir (VALTREX) 1000 MG tablet Take 1 g by mouth 3 (three) times daily as needed (cold sores). 06/21/16   [provider]  zaleplon (SONATA) 10 MG capsule Take 10 mg by mouth at bedtime as needed for sleep. 12/05/16   [provider]    Allergies    Prednisone, Amlodipine, Amoxicillin, Amoxicillin-pot clavulanate, Lactose intolerance (gi),  Limonene, and Sulfa antibiotics  Review of Systems   Review of Systems  Constitutional:  Negative for fever.  HENT:  Negative for ear pain.   Eyes:  Negative for pain.  Respiratory:  Negative for cough.   Cardiovascular:  Negative for chest pain.  Gastrointestinal:  Negative for abdominal pain.  Genitourinary:  Negative for flank pain.  Musculoskeletal:  Negative for back pain.  Skin:  Negative for rash.  Neurological:  Negative for headaches.   Physical Exam Updated Vital Signs BP (!) 148/53   Pulse (!) 56   Temp (!) 97.5 F (36.4 C) (Oral)   Resp 18   Ht 5\' 4"  (1.626 m)   Wt 59 kg   SpO2 97%   BMI 22.33 kg/m   Physical Exam Constitutional:      General: She is not in acute distress.    Appearance: Normal appearance.  HENT:     Head: Normocephalic.     Nose: Nose normal.  Eyes:     Extraocular Movements: Extraocular movements intact.  Cardiovascular:     Rate and Rhythm: Normal rate.  Pulmonary:     Effort: Pulmonary effort is normal.  Musculoskeletal:        General: Normal range of motion.     Cervical back: Normal range of motion.     Comments: No C or T or L-spine midline step-offs or tenderness noted. Patient able to range bilateral hips and knees and ankles without pain or discomfort.  She states she has no pain with ranging her joints.  Neurological:     General: No focal deficit present.     Mental Status: She is alert. Mental status is at baseline.    ED Results / Procedures / Treatments   Labs (all labs ordered are listed, but only abnormal results are displayed) Labs Reviewed  COMPREHENSIVE METABOLIC PANEL - Abnormal; Notable for the following components:      Result Value   Sodium 131 (*)    Chloride 97 (*)    BUN 26 (*)    Calcium 8.8 (*)    GFR, Estimated 54 (*)    All other components within normal limits  RESP PANEL BY RT-PCR (FLU A&B, COVID) [id number]  CBC  TYPE AND SCREEN    EKG EKG Interpretation  Date/Time:  Friday December 22 2020 20:58:29 EST Ventricular Rate:  74 PR Interval:  176 QRS Duration: 80 QT Interval:  447 QTC Calculation: 451 R Axis:   66 Text Interpretation: Sinus rhythm Paired ventricular premature complexes Anterior infarct, old Minimal ST elevation, inferior leads Confirmed by Thamas Jaegers ([id number]) on 12/22/2020 9:54:18 PM  Radiology DG Chest 1 View  Result Date: 12/22/2020 CLINICAL DATA:  Fall EXAM: CHEST  1 VIEW COMPARISON:  09/22/2020 FINDINGS: Heart is normal size. Lungs are clear. No effusions or pneumothorax. No acute bony abnormality. Aortic atherosclerosis. IMPRESSION: No active disease. Electronically Signed   By: Rolm Baptise M.D.   On: 12/22/2020 21:50   DG Hip Unilat With Pelvis 2-3 Views Right  Result Date: 12/22/2020 CLINICAL DATA:  Right leg pain EXAM: DG HIP (WITH OR WITHOUT PELVIS) 2-3V RIGHT COMPARISON:  None. FINDINGS: No fracture or dislocation is seen. Bilateral hip joint spaces are preserved. Visualized bony pelvis appears intact. Mild degenerative changes of the lower lumbar spine. IMPRESSION: Negative. Electronically Signed   By: Julian Hy M.D.   On: 12/22/2020 21:48   DG Femur Min 2 Views Right  Result Date: 12/22/2020 CLINICAL DATA:  Right leg pain EXAM: RIGHT FEMUR 2 VIEWS COMPARISON:  None. FINDINGS: No fracture or dislocation is seen. Mild degenerative changes in the knee. No suprapatellar knee joint effusion. IMPRESSION: Negative. Electronically Signed   By: Julian Hy M.D.   On: 12/22/2020 21:50    Procedures Procedures   Medications Ordered in ED Medications - No data to display  ED Course  I have reviewed the triage vital signs and the nursing notes.  Pertinent labs & imaging results that were available during my care of the patient were reviewed by me and considered in my medical decision making (see chart for details).    MDM Rules/Calculators/A&P                           Labs and x-rays are unremarkable.  No acute fracture noted on  x-ray here.  Outside report of an x-ray of the right femur showing right distal femur fracture reviewed.  This indicates right distal femur fracture at the articular surface.  CT imaging pursued here in the ER. Patient signed out to oncoming provider.  Final Clinical Impression(s) / ED Diagnoses Final diagnoses:  Fall    Rx / DC Orders ED Discharge Orders     None        Luna Fuse, MD 12/22/20 2238

## 2020-12-23 DIAGNOSIS — S72401A Unspecified fracture of lower end of right femur, initial encounter for closed fracture: Secondary | ICD-10-CM | POA: Diagnosis not present

## 2020-12-23 LAB — URINALYSIS, ROUTINE W REFLEX MICROSCOPIC
Bilirubin Urine: NEGATIVE
Glucose, UA: NEGATIVE mg/dL
Hgb urine dipstick: NEGATIVE
Ketones, ur: NEGATIVE mg/dL
Nitrite: NEGATIVE
Protein, ur: NEGATIVE mg/dL
Specific Gravity, Urine: 1.006 (ref 1.005–1.030)
WBC, UA: >50 WBC/hpf — ABNORMAL HIGH (ref 0–5)
pH: 6 (ref 5.0–8.0)

## 2020-12-23 NOTE — ED Notes (Signed)
Call[place] patients daughter [place], told her patient was being transported back to facility.

## 2020-12-23 NOTE — ED Notes (Signed)
[facility] called  

## 2020-12-23 NOTE — ED Notes (Signed)
Ree Kida [phone] son. Alvino Chapel [phone] daughter in law.

## 2020-12-23 NOTE — Discharge Instructions (Signed)
Wear the knee immobilizer at all times except for bathing.  Apply ice for 30 minutes at a time, 4 times a day.  Please follow-up with the orthopedic physician next week.

## 2021-01-23 DIAGNOSIS — S2232XA Fracture of one rib, left side, initial encounter for closed fracture: Secondary | ICD-10-CM | POA: Insufficient documentation

## 2021-05-18 ENCOUNTER — Ambulatory Visit: Payer: Medicare Other | Admitting: Neurology

## 2021-08-26 ENCOUNTER — Emergency Department (HOSPITAL_COMMUNITY): Payer: Medicare Other

## 2021-08-26 ENCOUNTER — Other Ambulatory Visit: Payer: Self-pay

## 2021-08-26 ENCOUNTER — Inpatient Hospital Stay (HOSPITAL_COMMUNITY)
Admission: EM | Admit: 2021-08-26 | Discharge: 2021-08-30 | DRG: 689 | Disposition: A | Discharge status: Skilled Nursing Facility | Payer: Medicare Other | Source: Skilled Nursing Facility | Attending: Internal Medicine | Admitting: Internal Medicine

## 2021-08-26 ENCOUNTER — Encounter (HOSPITAL_COMMUNITY): Payer: Self-pay

## 2021-08-26 DIAGNOSIS — Z1624 Resistance to multiple antibiotics: Secondary | ICD-10-CM | POA: Diagnosis present

## 2021-08-26 DIAGNOSIS — N390 Urinary tract infection, site not specified: Secondary | ICD-10-CM | POA: Diagnosis not present

## 2021-08-26 DIAGNOSIS — M8448XD Pathological fracture, other site, subsequent encounter for fracture with routine healing: Secondary | ICD-10-CM | POA: Diagnosis present

## 2021-08-26 DIAGNOSIS — B9620 Unspecified Escherichia coli [E. coli] as the cause of diseases classified elsewhere: Secondary | ICD-10-CM | POA: Diagnosis present

## 2021-08-26 DIAGNOSIS — G9341 Metabolic encephalopathy: Secondary | ICD-10-CM | POA: Diagnosis present

## 2021-08-26 DIAGNOSIS — Z888 Allergy status to other drugs, medicaments and biological substances status: Secondary | ICD-10-CM | POA: Diagnosis not present

## 2021-08-26 DIAGNOSIS — F32A Depression, unspecified: Secondary | ICD-10-CM | POA: Diagnosis present

## 2021-08-26 DIAGNOSIS — Z87891 Personal history of nicotine dependence: Secondary | ICD-10-CM | POA: Diagnosis not present

## 2021-08-26 DIAGNOSIS — S22009A Unspecified fracture of unspecified thoracic vertebra, initial encounter for closed fracture: Secondary | ICD-10-CM | POA: Diagnosis present

## 2021-08-26 DIAGNOSIS — Z8249 Family history of ischemic heart disease and other diseases of the circulatory system: Secondary | ICD-10-CM | POA: Diagnosis not present

## 2021-08-26 DIAGNOSIS — S22079A Unspecified fracture of T9-T10 vertebra, initial encounter for closed fracture: Secondary | ICD-10-CM | POA: Diagnosis present

## 2021-08-26 DIAGNOSIS — Z8673 Personal history of transient ischemic attack (TIA), and cerebral infarction without residual deficits: Secondary | ICD-10-CM

## 2021-08-26 DIAGNOSIS — Y92009 Unspecified place in unspecified non-institutional (private) residence as the place of occurrence of the external cause: Secondary | ICD-10-CM | POA: Diagnosis not present

## 2021-08-26 DIAGNOSIS — Z9181 History of falling: Secondary | ICD-10-CM

## 2021-08-26 DIAGNOSIS — S22069A Unspecified fracture of T7-T8 vertebra, initial encounter for closed fracture: Secondary | ICD-10-CM | POA: Diagnosis present

## 2021-08-26 DIAGNOSIS — I10 Essential (primary) hypertension: Secondary | ICD-10-CM | POA: Diagnosis present

## 2021-08-26 DIAGNOSIS — S2249XA Multiple fractures of ribs, unspecified side, initial encounter for closed fracture: Secondary | ICD-10-CM | POA: Diagnosis present

## 2021-08-26 DIAGNOSIS — Z79899 Other long term (current) drug therapy: Secondary | ICD-10-CM | POA: Diagnosis not present

## 2021-08-26 DIAGNOSIS — S22089A Unspecified fracture of T11-T12 vertebra, initial encounter for closed fracture: Secondary | ICD-10-CM | POA: Diagnosis present

## 2021-08-26 DIAGNOSIS — E785 Hyperlipidemia, unspecified: Secondary | ICD-10-CM | POA: Diagnosis present

## 2021-08-26 DIAGNOSIS — Z79818 Long term (current) use of other agents affecting estrogen receptors and estrogen levels: Secondary | ICD-10-CM | POA: Diagnosis not present

## 2021-08-26 DIAGNOSIS — Z880 Allergy status to penicillin: Secondary | ICD-10-CM | POA: Diagnosis not present

## 2021-08-26 DIAGNOSIS — W19XXXA Unspecified fall, initial encounter: Secondary | ICD-10-CM | POA: Diagnosis present

## 2021-08-26 DIAGNOSIS — Z7189 Other specified counseling: Secondary | ICD-10-CM | POA: Diagnosis not present

## 2021-08-26 DIAGNOSIS — S2242XA Multiple fractures of ribs, left side, initial encounter for closed fracture: Secondary | ICD-10-CM | POA: Diagnosis not present

## 2021-08-26 DIAGNOSIS — I5032 Chronic diastolic (congestive) heart failure: Secondary | ICD-10-CM | POA: Diagnosis present

## 2021-08-26 DIAGNOSIS — Z823 Family history of stroke: Secondary | ICD-10-CM

## 2021-08-26 DIAGNOSIS — Z882 Allergy status to sulfonamides status: Secondary | ICD-10-CM | POA: Diagnosis not present

## 2021-08-26 DIAGNOSIS — Z66 Do not resuscitate: Secondary | ICD-10-CM | POA: Diagnosis present

## 2021-08-26 DIAGNOSIS — Z515 Encounter for palliative care: Secondary | ICD-10-CM | POA: Diagnosis not present

## 2021-08-26 DIAGNOSIS — Z7982 Long term (current) use of aspirin: Secondary | ICD-10-CM | POA: Diagnosis not present

## 2021-08-26 DIAGNOSIS — N3001 Acute cystitis with hematuria: Principal | ICD-10-CM | POA: Diagnosis present

## 2021-08-26 DIAGNOSIS — I5033 Acute on chronic diastolic (congestive) heart failure: Secondary | ICD-10-CM | POA: Diagnosis present

## 2021-08-26 DIAGNOSIS — R52 Pain, unspecified: Secondary | ICD-10-CM | POA: Diagnosis not present

## 2021-08-26 DIAGNOSIS — I110 Hypertensive heart disease with heart failure: Secondary | ICD-10-CM | POA: Diagnosis present

## 2021-08-26 DIAGNOSIS — R4182 Altered mental status, unspecified: Principal | ICD-10-CM

## 2021-08-26 DIAGNOSIS — F419 Anxiety disorder, unspecified: Secondary | ICD-10-CM | POA: Diagnosis present

## 2021-08-26 HISTORY — DX: Transient cerebral ischemic attack, unspecified: G45.9

## 2021-08-26 LAB — URINALYSIS, ROUTINE W REFLEX MICROSCOPIC
Bilirubin Urine: NEGATIVE
Glucose, UA: NEGATIVE mg/dL
Hgb urine dipstick: NEGATIVE
Ketones, ur: 5 mg/dL — AB
Nitrite: POSITIVE — AB
Protein, ur: NEGATIVE mg/dL
Specific Gravity, Urine: 1.006 (ref 1.005–1.030)
WBC, UA: >50 WBC/hpf — ABNORMAL HIGH (ref 0–5)
pH: 6 (ref 5.0–8.0)

## 2021-08-26 LAB — BASIC METABOLIC PANEL
Anion gap: 13 (ref 5–15)
BUN: 13 mg/dL (ref 8–23)
CO2: 26 mmol/L (ref 22–32)
Calcium: 9.6 mg/dL (ref 8.9–10.3)
Chloride: 99 mmol/L (ref 98–111)
Creatinine, Ser: 0.73 mg/dL (ref 0.44–1.00)
GFR, Estimated: >60 mL/min (ref >60)
Glucose, Bld: 92 mg/dL (ref 70–99)
Potassium: 4 mmol/L (ref 3.5–5.1)
Sodium: 138 mmol/L (ref 135–145)

## 2021-08-26 LAB — HEPATIC FUNCTION PANEL
ALT: 18 U/L (ref 0–44)
AST: 23 U/L (ref 15–41)
Albumin: 4.2 g/dL (ref 3.5–5.0)
Alkaline Phosphatase: 73 U/L (ref 38–126)
Bilirubin, Direct: 0.1 mg/dL (ref 0.0–0.2)
Indirect Bilirubin: 0.5 mg/dL (ref 0.3–0.9)
Total Bilirubin: 0.6 mg/dL (ref 0.3–1.2)
Total Protein: 7.3 g/dL (ref 6.5–8.1)

## 2021-08-26 LAB — CBC WITH DIFFERENTIAL/PLATELET
Abs Immature Granulocytes: 0.03 10*3/uL (ref 0.00–0.07)
Basophils Absolute: 0.1 10*3/uL (ref 0.0–0.1)
Basophils Relative: 1 %
Eosinophils Absolute: 0.3 10*3/uL (ref 0.0–0.5)
Eosinophils Relative: 3 %
HCT: 40.7 % (ref 36.0–46.0)
Hemoglobin: 13.8 g/dL (ref 12.0–15.0)
Immature Granulocytes: 0 %
Lymphocytes Relative: 12 %
Lymphs Abs: 1.4 10*3/uL (ref 0.7–4.0)
MCH: 31.8 pg (ref 26.0–34.0)
MCHC: 33.9 g/dL (ref 30.0–36.0)
MCV: 93.8 fL (ref 80.0–100.0)
Monocytes Absolute: 1 10*3/uL (ref 0.1–1.0)
Monocytes Relative: 9 %
Neutro Abs: 8.5 10*3/uL — ABNORMAL HIGH (ref 1.7–7.7)
Neutrophils Relative %: 75 %
Platelets: 241 10*3/uL (ref 150–400)
RBC: 4.34 MIL/uL (ref 3.87–5.11)
RDW: 13.3 % (ref 11.5–15.5)
WBC: 11.3 10*3/uL — ABNORMAL HIGH (ref 4.0–10.5)
nRBC: 0 % (ref 0.0–0.2)

## 2021-08-26 LAB — TROPONIN I (HIGH SENSITIVITY)
Troponin I (High Sensitivity): 7 ng/L (ref <18)
Troponin I (High Sensitivity): 5 ng/L (ref <18)

## 2021-08-26 MED ORDER — GABAPENTIN 100 MG PO CAPS
200.0000 mg | ORAL_CAPSULE | Freq: Every day | ORAL | Status: DC
  Administered 2021-08-26 – 2021-08-29 (×4): 200 mg via ORAL
  Filled 2021-08-26 (×4): qty 2

## 2021-08-26 MED ORDER — LISINOPRIL 20 MG PO TABS
20.0000 mg | ORAL_TABLET | Freq: Every morning | ORAL | Status: DC
  Administered 2021-08-27 – 2021-08-30 (×4): 20 mg via ORAL
  Filled 2021-08-26 (×4): qty 1

## 2021-08-26 MED ORDER — ACETAMINOPHEN 325 MG PO TABS
650.0000 mg | ORAL_TABLET | Freq: Four times a day (QID) | ORAL | Status: DC | PRN
  Administered 2021-08-27 – 2021-08-29 (×2): 650 mg via ORAL
  Filled 2021-08-26 (×2): qty 2

## 2021-08-26 MED ORDER — SERTRALINE HCL 100 MG PO TABS
100.0000 mg | ORAL_TABLET | Freq: Every morning | ORAL | Status: DC
  Administered 2021-08-27 – 2021-08-30 (×4): 100 mg via ORAL
  Filled 2021-08-26 (×4): qty 1

## 2021-08-26 MED ORDER — OXYCODONE HCL 5 MG PO TABS
2.5000 mg | ORAL_TABLET | ORAL | Status: DC | PRN
  Administered 2021-08-26: 2.5 mg via ORAL
  Filled 2021-08-26: qty 1

## 2021-08-26 MED ORDER — MIRTAZAPINE 15 MG PO TABS
15.0000 mg | ORAL_TABLET | Freq: Every day | ORAL | Status: DC
  Administered 2021-08-26 – 2021-08-28 (×3): 15 mg via ORAL
  Filled 2021-08-26 (×3): qty 1

## 2021-08-26 MED ORDER — METOPROLOL TARTRATE 5 MG/5ML IV SOLN
5.0000 mg | Freq: Once | INTRAVENOUS | Status: AC
  Administered 2021-08-26: 5 mg via INTRAVENOUS
  Filled 2021-08-26: qty 5

## 2021-08-26 MED ORDER — ACETAMINOPHEN 650 MG RE SUPP: 650.0000 mg | Freq: Four times a day (QID) | RECTAL | Status: DC | PRN

## 2021-08-26 MED ORDER — ONDANSETRON HCL 4 MG/2ML IJ SOLN
4.0000 mg | Freq: Four times a day (QID) | INTRAMUSCULAR | Status: DC | PRN
  Administered 2021-08-26 – 2021-08-28 (×5): 4 mg via INTRAVENOUS
  Filled 2021-08-26 (×5): qty 2

## 2021-08-26 MED ORDER — BUPROPION HCL ER (SR) 150 MG PO TB12
150.0000 mg | ORAL_TABLET | Freq: Two times a day (BID) | ORAL | Status: DC
  Administered 2021-08-26 – 2021-08-28 (×4): 150 mg via ORAL
  Filled 2021-08-26 (×4): qty 1

## 2021-08-26 MED ORDER — ORAL CARE MOUTH RINSE: 15.0000 mL | OROMUCOSAL | Status: DC | PRN

## 2021-08-26 MED ORDER — MORPHINE SULFATE (PF) 2 MG/ML IV SOLN
2.0000 mg | Freq: Once | INTRAVENOUS | Status: AC
  Administered 2021-08-26: 2 mg via INTRAVENOUS
  Filled 2021-08-26: qty 1

## 2021-08-26 MED ORDER — FENTANYL CITRATE PF 50 MCG/ML IJ SOSY
25.0000 ug | PREFILLED_SYRINGE | INTRAMUSCULAR | Status: AC | PRN
  Administered 2021-08-26 – 2021-08-27 (×2): 25 ug via INTRAVENOUS
  Filled 2021-08-26 (×2): qty 1

## 2021-08-26 MED ORDER — KETOROLAC TROMETHAMINE 15 MG/ML IJ SOLN
15.0000 mg | Freq: Three times a day (TID) | INTRAMUSCULAR | Status: AC | PRN
  Administered 2021-08-26 – 2021-08-28 (×3): 15 mg via INTRAVENOUS
  Filled 2021-08-26 (×3): qty 1

## 2021-08-26 MED ORDER — ESTROGENS CONJUGATED 0.625 MG/GM VA CREA
1.0000 | TOPICAL_CREAM | VAGINAL | Status: DC
Start: 2021-08-27 — End: 2021-08-30
  Administered 2021-08-27 – 2021-08-29 (×2): 1 via VAGINAL
  Filled 2021-08-26: qty 30

## 2021-08-26 MED ORDER — OXYCODONE HCL 5 MG PO TABS
2.5000 mg | ORAL_TABLET | Freq: Four times a day (QID) | ORAL | Status: DC | PRN
  Filled 2021-08-26: qty 1

## 2021-08-26 MED ORDER — MIRABEGRON ER 25 MG PO TB24
25.0000 mg | ORAL_TABLET | Freq: Every day | ORAL | Status: DC
  Administered 2021-08-26 – 2021-08-30 (×5): 25 mg via ORAL
  Filled 2021-08-26 (×5): qty 1

## 2021-08-26 MED ORDER — ONDANSETRON HCL 4 MG PO TABS: 4.0000 mg | ORAL_TABLET | Freq: Four times a day (QID) | ORAL | Status: DC | PRN

## 2021-08-26 MED ORDER — HYDRALAZINE HCL 25 MG PO TABS: 25.0000 mg | ORAL_TABLET | Freq: Four times a day (QID) | ORAL | Status: DC | PRN

## 2021-08-26 MED ORDER — ATORVASTATIN CALCIUM 40 MG PO TABS
40.0000 mg | ORAL_TABLET | Freq: Every day | ORAL | Status: DC
  Administered 2021-08-26 – 2021-08-29 (×4): 40 mg via ORAL
  Filled 2021-08-26 (×4): qty 1

## 2021-08-26 MED ORDER — FENTANYL CITRATE PF 50 MCG/ML IJ SOSY
50.0000 ug | PREFILLED_SYRINGE | Freq: Once | INTRAMUSCULAR | Status: AC
  Administered 2021-08-26: 50 ug via INTRAVENOUS
  Filled 2021-08-26: qty 1

## 2021-08-26 MED ORDER — ASPIRIN 325 MG PO TABS
325.0000 mg | ORAL_TABLET | Freq: Every day | ORAL | Status: DC
  Administered 2021-08-26 – 2021-08-30 (×5): 325 mg via ORAL
  Filled 2021-08-26 (×5): qty 1

## 2021-08-26 MED ORDER — SODIUM CHLORIDE 0.9 % IV BOLUS
1000.0000 mL | Freq: Once | INTRAVENOUS | Status: AC
  Administered 2021-08-26: 1000 mL via INTRAVENOUS

## 2021-08-26 MED ORDER — ENOXAPARIN SODIUM 40 MG/0.4ML IJ SOSY
40.0000 mg | PREFILLED_SYRINGE | Freq: Every day | INTRAMUSCULAR | Status: DC
  Administered 2021-08-26 – 2021-08-29 (×4): 40 mg via SUBCUTANEOUS
  Filled 2021-08-26 (×4): qty 0.4

## 2021-08-26 MED ORDER — PANTOPRAZOLE SODIUM 40 MG PO TBEC
40.0000 mg | DELAYED_RELEASE_TABLET | Freq: Every morning | ORAL | Status: DC
  Administered 2021-08-27 – 2021-08-30 (×4): 40 mg via ORAL
  Filled 2021-08-26 (×4): qty 1

## 2021-08-26 MED ORDER — FLUTICASONE PROPIONATE 50 MCG/ACT NA SUSP
1.0000 | Freq: Two times a day (BID) | NASAL | Status: DC
  Administered 2021-08-27 – 2021-08-30 (×7): 1 via NASAL
  Filled 2021-08-26: qty 16

## 2021-08-26 MED ORDER — LORATADINE 10 MG PO TABS
10.0000 mg | ORAL_TABLET | Freq: Every day | ORAL | Status: DC
  Administered 2021-08-26 – 2021-08-30 (×5): 10 mg via ORAL
  Filled 2021-08-26 (×5): qty 1

## 2021-08-26 MED ORDER — SODIUM CHLORIDE 0.9 % IV SOLN
1.0000 g | INTRAVENOUS | Status: DC
  Administered 2021-08-27 – 2021-08-29 (×3): 1 g via INTRAVENOUS
  Filled 2021-08-26 (×3): qty 10

## 2021-08-26 MED ORDER — CLONAZEPAM 0.5 MG PO TABS: 0.2500 mg | ORAL_TABLET | ORAL | Status: DC

## 2021-08-26 MED ORDER — ONDANSETRON HCL 4 MG/2ML IJ SOLN
4.0000 mg | Freq: Once | INTRAMUSCULAR | Status: AC
  Administered 2021-08-26: 4 mg via INTRAVENOUS
  Filled 2021-08-26: qty 2

## 2021-08-26 MED ORDER — FERROUS SULFATE 325 (65 FE) MG PO TABS
325.0000 mg | ORAL_TABLET | Freq: Every morning | ORAL | Status: DC
  Administered 2021-08-27 – 2021-08-30 (×4): 325 mg via ORAL
  Filled 2021-08-26 (×4): qty 1

## 2021-08-26 MED ORDER — SODIUM CHLORIDE 0.9 % IV SOLN
1.0000 g | Freq: Once | INTRAVENOUS | Status: AC
  Administered 2021-08-26: 1 g via INTRAVENOUS
  Filled 2021-08-26: qty 10

## 2021-08-26 NOTE — Progress Notes (Signed)
Patient called on call bell, RN entered the room and patient said she was having pain.  Was recently given Toradol for right and left rib pain but patient said this felt different, was more mid chest.  Vital signs obtained, normal other than Elevated BP, HR 62, oxygen 98% on 1 liter. EKG performed, read as NSR HR 63. Fentanyl 25 mcg given for pain, MD notified of above.

## 2021-08-26 NOTE — ED Provider Notes (Signed)
Samantha Crane Provider Note   CSN: [id number] Arrival date & time: 08/26/21  2979     History Chief Complaint  Patient presents with   Samantha Crane    Samantha Crane is a 88 y.o. female who presents to the emergency department today after a fall that occurred yesterday.  Patient lives in assisted living facility.  Patient states she had used the bathroom and one of the nurses were being rude to her.  She ultimately got up by herself without her walker and fell backwards.  She complaining of pain in her lower back and right side of her ribs.  She does have history of rib fractures that were diagnosed 2 weeks ago on the left.  She did not hit her head or lose consciousness.  Daughter is at bedside and provides some of the history.  She states that the patient has been increasingly altered over the last 5 days.  She was diagnosed with a UTI and placed on Keflex.  Culture still resulting.  She states that the patient was put on Keflex prophylactically and they have not seen any results from the urine studies.  No fever or chills.   Fall       Home Medications Prior to Admission medications   Medication Sig Start Date End Date Taking? Authorizing Provider  aspirin 325 MG tablet Take 325 mg by mouth daily. 09/23/20  Yes [provider]  atorvastatin (LIPITOR) 40 MG tablet Take 1 tablet (40 mg total) by mouth at bedtime. 09/23/20 08/26/21 Yes Hall, Carole N, DO  buPROPion (WELLBUTRIN SR) 150 MG 12 hr tablet Take 150 mg by mouth 2 (two) times daily. 02/07/19  Yes [provider]  cetirizine (ZYRTEC) 5 MG tablet Take 5 mg by mouth every morning.   Yes [provider]  conjugated estrogens (PREMARIN) vaginal cream Place 1 applicator vaginally every Monday, Wednesday, and Friday. At bedtime   Yes [provider]  ferrous sulfate 325 (65 FE) MG tablet Take 325 mg by mouth every morning.   Yes [provider]  fluticasone (FLONASE)  50 MCG/ACT nasal spray Place 2 sprays into both nostrils daily as needed for allergies or rhinitis. Patient taking differently: Place 1 spray into both nostrils 2 (two) times daily. 08/13/17  Yes Bobbitt, Heywood Iles, MD  gabapentin (NEURONTIN) 100 MG capsule Take 200 mg by mouth daily.   Yes [provider]  gabapentin (NEURONTIN) 300 MG capsule Take 300 mg by mouth every morning.   Yes [provider]  lisinopril (PRINIVIL,ZESTRIL) 20 MG tablet Take 20 mg by mouth every morning.   Yes [provider]  mirtazapine (REMERON) 15 MG tablet Take 15 mg by mouth at bedtime.    Yes [provider]  Multiple Vitamin (MULTIVITAMIN WITH MINERALS) TABS tablet Take 1 tablet by mouth every morning.   Yes [provider]  pantoprazole (PROTONIX) 40 MG tablet Take 40 mg by mouth every morning.   Yes [provider]  Probiotic Product (BACID) CAPS Take 1 capsule by mouth 2 (two) times daily. Bacid with Lactospore 1 billion cell   Yes [provider]  sertraline (ZOLOFT) 100 MG tablet Take 100 mg by mouth every morning.   Yes [provider]  Vibegron (GEMTESA) 75 MG TABS Take 75 mg by mouth daily.   Yes [provider]  clonazePAM (KLONOPIN) 0.5 MG tablet Take 0.5 tablets (0.25 mg total) by mouth See admin instructions for 4 doses. 0.25mg  oral three times  daily And 0.25mg  oral twice daily as needed for anxiety Patient not taking: Reported on 08/26/2021 08/23/20   Lanae Boast, MD      Allergies    Prednisone, Amlodipine, Amoxicillin, Amoxicillin-pot clavulanate, Lactose intolerance (gi), Limonene, and Sulfa antibiotics    Review of Systems   Review of Systems  All other systems reviewed and are negative.   Physical Exam Updated Vital Signs BP (!) 176/58   Pulse 64   Temp 97.8 F (36.6 C) (Oral)   Resp 16   Ht 5\' 4"  (1.626 m)   Wt 59 kg   SpO2 100%   BMI 22.31 kg/m  Physical Exam Vitals and nursing note reviewed.   Constitutional:      General: She is not in acute distress.    Appearance: Normal appearance.     Comments: Slow to respond.  HENT:     Head: Normocephalic and atraumatic.  Eyes:     General:        Right eye: No discharge.        Left eye: No discharge.  Cardiovascular:     Comments: Regular rate and rhythm.  S1/S2 are distinct without any evidence of murmur, rubs, or gallops.  Radial pulses are 2+ bilaterally.  Dorsalis pedis pulses are 2+ bilaterally.  No evidence of pedal edema. Pulmonary:     Comments: Clear to auscultation bilaterally.  Normal effort.  No respiratory distress.  No evidence of wheezes, rales, or rhonchi heard throughout. Chest:     Comments: No tenderness over the right lateral chest wall. Abdominal:     General: Abdomen is flat. Bowel sounds are normal. There is no distension.     Tenderness: There is no abdominal tenderness. There is no guarding or rebound.  Musculoskeletal:        General: Normal range of motion.     Cervical back: Neck supple.     Comments: No tenderness over the thoracic spine.  There is midline lumbar tenderness.  Skin:    General: Skin is warm and dry.     Findings: No rash.  Neurological:     General: No focal deficit present.     Mental Status: She is alert.  Psychiatric:        Mood and Affect: Mood normal.        Behavior: Behavior normal.     ED Results / Procedures / Treatments   Labs (all labs ordered are listed, but only abnormal results are displayed) Labs Reviewed  CBC WITH DIFFERENTIAL/PLATELET - Abnormal; Notable for the following components:      Result Value   WBC 11.3 (*)    Neutro Abs 8.5 (*)    All other components within normal limits  URINALYSIS, ROUTINE W REFLEX MICROSCOPIC - Abnormal; Notable for the following components:   APPearance HAZY (*)    Ketones, ur 5 (*)    Nitrite POSITIVE (*)    Leukocytes,Ua LARGE (*)    WBC, UA >50 (*)    Bacteria, UA RARE (*)    All other components within normal  limits  URINE CULTURE  BASIC METABOLIC PANEL    EKG None  Radiology DG Ribs Bilateral W/Chest  Result Date: 08/26/2021 CLINICAL DATA:  Fall yesterday.  Pain. EXAM: BILATERAL RIBS AND CHEST - 4+ VIEW COMPARISON:  Chest x-ray December 22, 2020 FINDINGS: The heart, hila, and mediastinum are unremarkable. No pneumothorax. Mild atelectasis in the left base. No rib fractures identified. IMPRESSION: No rib fractures identified. No pneumothorax. No  acute abnormality. Electronically Signed   By: Dorise Bullion III M.D.   On: 08/26/2021 09:23   DG Lumbar Spine Complete  Result Date: 08/26/2021 CLINICAL DATA:  back pain after fall EXAM: LUMBAR SPINE - COMPLETE 4+ VIEW COMPARISON:  Correlation is made with the MRI of the lumbar spine dated March 31, 2019 FINDINGS: There is no evidence of lumbar spine fracture. Minimal dextroconvex rotoscoliosis. About 6 mm anterolisthesis of L4 on L5. Moderate lumbar spondylosis with moderate facet hypertrophic changes more so at L3-L4 through L5-S1. Loss of the disc space at L5-S1 with endplate sclerosis. Moderately severe atherosclerotic calcifications of the abdominal aorta and iliac arteries. IMPRESSION: 1.  No fracture seen. 2. Moderate lumbar spondylosis with grade 1 anterolisthesis at L4-L5, stable. 3. Loss of the disc space with endplate sclerotic changes at L5-S1, stable. Electronically Signed   By: Frazier Richards M.D.   On: 08/26/2021 09:20   CT Head Wo Contrast  Result Date: 08/26/2021 CLINICAL DATA:  Mental status change.  Fall. EXAM: CT HEAD WITHOUT CONTRAST TECHNIQUE: Contiguous axial images were obtained from the base of the skull through the vertex without intravenous contrast. RADIATION DOSE REDUCTION: This exam was performed according to the departmental dose-optimization program which includes automated exposure control, adjustment of the mA and/or kV according to patient size and/or use of iterative reconstruction technique. COMPARISON:  CT scan of the  brain September 22, 2020 FINDINGS: Brain: No subdural, epidural, or subarachnoid hemorrhage. Moderate to severe white matter changes are similar in the interval. Cerebellum, brainstem, and basal cisterns are normal. No cortical ischemia or interval infarct identified. No mass effect or midline shift. Ventricles and sulci are prominent but stable. Vascular: Calcified atherosclerotic changes are identified in the intracranial carotids. Skull: Normal. Negative for fracture or focal lesion. Sinuses/Orbits: No acute finding. Other: None. IMPRESSION: No acute intracranial abnormalities.  Chronic white matter changes. Electronically Signed   By: Dorise Bullion III M.D.   On: 08/26/2021 08:38    Procedures Procedures    Medications Ordered in ED Medications  cefTRIAXone (ROCEPHIN) 1 g in sodium chloride 0.9 % 100 mL IVPB (has no administration in time range)  ondansetron (ZOFRAN) injection 4 mg (4 mg Intravenous Given 08/26/21 0906)  fentaNYL (SUBLIMAZE) injection 50 mcg (50 mcg Intravenous Given 08/26/21 0906)  sodium chloride 0.9 % bolus 1,000 mL (0 mLs Intravenous Stopped 08/26/21 1157)  cefTRIAXone (ROCEPHIN) 1 g in sodium chloride 0.9 % 100 mL IVPB (0 g Intravenous Stopped 08/26/21 1323)  morphine (PF) 2 MG/ML injection 2 mg (2 mg Intravenous Given 08/26/21 1400)    ED Course/ Medical Decision Making/ A&P Clinical Course as of 08/26/21 1427  Sun Aug 26, 2021  1013 CBC with Differential(!) There is evidence of leukocytosis without evidence of anemia. [CF]  0000000 Basic metabolic panel BMP is normal. [CF]  1158 Urinalysis, Routine w reflex microscopic Urine, In & Out Cath(!) Urine clinically appears infected.  It is likely cause of her altered mental status.  Will start 1 g of ceftriaxone IV here in the department. [CF]  1158 Urine Culture In process. [CF]  [id number] On reevaluation, patient is much more alert and conversant after liter of fluid.  I went over all labs and imaging with the patient and  daughter at bedside.  All questions and concerns addressed. [CF]  [id number] CT Head Wo Contrast Personally ordered and interpreted this study.  No evidence of intracranial hemorrhage.  Do agree with the radiologist interpretation. [CF]  1256 DG Lumbar Spine Complete  I personally ordered and interpreted this study.  No evidence of acute fracture.  There are some chronic arthritic changes.  I do agree with the radiologist interpretation. [CF]  [id number] DG Ribs Bilateral W/Chest I personally ordered and interpreted this study.  I do agree with the radiologist interpretation.  No evidence of fracture. [CF]  4636 88 year old female here with back pain after a fall.  She is neuro intact.  Imaging does not show any acute fracture.  Unable to safely ambulate here so we will talk to hospitalist regarding admission and further imaging necessary. [MB]  1400 On repeat evaluation, patient was unable to ambulate secondary to pain in her back.  Patient is feeling fine when she is lying supine but the second you raised the head of the bed she begins screaming in pain complaining of pain in her back.  Do not feel the patient is safe for discharge and she still having some altered mental status. [CF]  1426 I spoke with Dr. Olevia Bowens with [facility] hospitalist who agrees to admit the patient. [CF]    Clinical Course User Index [CF] Hendricks Limes, [name] [MB] Hayden Rasmussen, MD                           Medical Decision Making Saheli Krummen is a 88 y.o. female patient who presents to the emergency department today with multiple complaints.  With regards to her fall, going to scan her head to for possible head trauma in addition to the altered mental status.  I am going to add on rib x-rays and imaging of the lumbar spine to evaluate for fracture or dislocation.  I will also repeat urinalysis and urine culture to further evaluate.  I will plan to reassess.  Pain medication ordered in addition to antiemetics as the patient is  nauseous.  Due to the clinical scenario, I do feel the patient would likely benefit from further evaluation in the hospital.  We will work on getting her admitted for altered mental status, generalized weakness secondary to urinary tract infection, and intractable back pain.  Amount and/or Complexity of Data Reviewed Labs: ordered. Decision-making details documented in ED Course. Radiology: ordered and independent interpretation performed. Decision-making details documented in ED Course.  Risk Prescription drug management. Decision regarding hospitalization.   Final Clinical Impression(s) / ED Diagnoses Final diagnoses:  Altered mental status, unspecified altered mental status type  Acute cystitis with hematuria    Rx / DC Orders ED Discharge Orders     None         Hendricks Limes, [place] 08/26/21 1427    Hayden Rasmussen, MD 08/26/21 1737

## 2021-08-26 NOTE — H&P (Signed)
History and Physical    Patient: Samantha Crane DOB: [birth year] DOA: 08/26/2021 DOS: the patient was seen and examined on 08/26/2021 PCP: Nadara Eaton, MD  Patient coming from: Home  Chief Complaint:  Chief Complaint  Patient presents with   Fall   HPI: Samantha Crane is a 88 y.o. female with medical history significant of anxiety, depression, CKD, hyperlipidemia, hypertension, IBS, chronic diarrhea, PUD with hemorrhage, seasonal allergies, spinal stenosis, orthostatic tremor, vitamin D deficiency, TIA who is coming to the emergency department due to having a fall at her facility landing backwards and on her left sided chest.  She had a fall last month sustaining a rib fracture.  She has been having and UTI and has been treated with K-Flex for the past 3 days.  However, her daughter state that she has been confused for the past 5 days.  No fever to the daughter's knowledge.  She is able to answer simple questions and stated that she only has pain when moving.  No headache, chest or abdominal pain or nausea at the moment.  ED course: Initial vital signs were temperature 97.6 F, pulse 57, respiration 18, BP 186/73 mmHg O2 sat 92% on room air.  The patient received fentanyl 50 mcg IVP, ondansetron 4 mg IVP and later morphine 2 mg IVP.  She had transient hypoxia after fentanyl administration.  Lab work: Urinalysis showed positive nitrates, large leukocyte esterase, more than 50 WBC and rare bacteria microscopic examination.  CBC with a white count of 11.3, hemoglobin 13.8 g/dL platelets 144.  BMP and LFTs were normal.  Imaging: Lumbar, rib series x-rays, CT head and CT lumbar spine with no acute abnormality.  However, thoracic spine CT came back with a T8 vertebral compression fracture with 25% body height loss and a 3 mm bony retropulsion centrally with no evidence suggesting significant cannula stenosis at this level.  There are acute nondisplaced fractures of the left-sided  transverse processes of T8, T9, T10 and T11.  There are nondisplaced posterior rib fractures at the same levels with involvement of the costovertebral junctions ribs 10 and 11.  There are subacute appearing fractures of the posterior right 10th and 11th ribs with bony callus formation.  There are chronic compression fractures of T9 and T10.  There is small left-sided and trace right-sided pleural effusions.  There is mild ascending thoracic aortic tach measuring 4.1 cm with annual imaging follow-up by CTA or MRA recommended.  Please see images and full radiology report for further details.  Review of Systems: As mentioned in the history of present illness. All other systems reviewed and are negative.  Past Medical History:  Diagnosis Date   Anxiety    Chronic kidney disease    pt denies, states chronic bladder infections   Depression    Hyperlipidemia    Hypertension    IBS (irritable bowel syndrome)    Orthostatic tremor    Vitamin D deficiency    Past Surgical History:  Procedure Laterality Date   lumbar spinal stenosis     Social History:  reports that she has quit smoking. Her smoking use included cigarettes. She smoked an average of .25 packs per day. She has never used smokeless tobacco. She reports that she does not drink alcohol and does not use drugs.  Allergies  Allergen Reactions   Prednisone Other (See Comments)     Manic tendencies   Amlodipine Swelling   Amoxicillin Other (See Comments)    Unknown reaction   Amoxicillin-Pot  Clavulanate Diarrhea    Tolerated Unasyn 08/2020   Lactose Intolerance (Gi) Other (See Comments)    Unknown reaction   Limonene Diarrhea   Sulfa Antibiotics Other (See Comments)    Unknown reaction    Family History  Problem Relation Age of Onset   Heart attack Father    Stroke Mother    Stroke Brother    Stroke Paternal Aunt    Allergic rhinitis Neg Hx    Angioedema Neg Hx    Asthma Neg Hx    Immunodeficiency Neg Hx    Eczema Neg Hx     Urticaria Neg Hx     Prior to Admission medications   Medication Sig Start Date End Date Taking? Authorizing Provider  aspirin 325 MG tablet Take 325 mg by mouth daily. 09/23/20  Yes [provider]  atorvastatin (LIPITOR) 40 MG tablet Take 1 tablet (40 mg total) by mouth at bedtime. 09/23/20 08/26/21 Yes Hall, Carole N, DO  buPROPion (WELLBUTRIN SR) 150 MG 12 hr tablet Take 150 mg by mouth 2 (two) times daily. 02/07/19  Yes [provider]  cetirizine (ZYRTEC) 5 MG tablet Take 5 mg by mouth every morning.   Yes [provider]  conjugated estrogens (PREMARIN) vaginal cream Place 1 applicator vaginally every Monday, Wednesday, and Friday. At bedtime   Yes [provider]  ferrous sulfate 325 (65 FE) MG tablet Take 325 mg by mouth every morning.   Yes [provider]  fluticasone (FLONASE) 50 MCG/ACT nasal spray Place 2 sprays into both nostrils daily as needed for allergies or rhinitis. Patient taking differently: Place 1 spray into both nostrils 2 (two) times daily. 08/13/17  Yes Bobbitt, Heywood Iles, MD  gabapentin (NEURONTIN) 100 MG capsule Take 200 mg by mouth daily.   Yes [provider]  gabapentin (NEURONTIN) 300 MG capsule Take 300 mg by mouth every morning.   Yes [provider]  lisinopril (PRINIVIL,ZESTRIL) 20 MG tablet Take 20 mg by mouth every morning.   Yes [provider]  mirtazapine (REMERON) 15 MG tablet Take 15 mg by mouth at bedtime.    Yes [provider]  Multiple Vitamin (MULTIVITAMIN WITH MINERALS) TABS tablet Take 1 tablet by mouth every morning.   Yes [provider]  pantoprazole (PROTONIX) 40 MG tablet Take 40 mg by mouth every morning.   Yes [provider]  Probiotic Product (BACID) CAPS Take 1 capsule by mouth 2 (two) times daily. Bacid with Lactospore 1 billion cell   Yes [provider]  sertraline (ZOLOFT) 100 MG tablet Take 100 mg by mouth every morning.    Yes [provider]  Vibegron (GEMTESA) 75 MG TABS Take 75 mg by mouth daily.   Yes [provider]  clonazePAM (KLONOPIN) 0.5 MG tablet Take 0.5 tablets (0.25 mg total) by mouth See admin instructions for 4 doses. 0.25mg  oral three times daily And 0.25mg  oral twice daily as needed for anxiety Patient not taking: Reported on 08/26/2021 08/23/20   Lanae Boast, MD    Physical Exam: Vitals:   08/26/21 1315 08/26/21 1345 08/26/21 1423 08/26/21 1445  BP: (!) 172/64 (!) 176/58  (!) 178/52  Pulse: 60 64  (!) 59  Resp: 17 16  18   Temp:   97.8 F (36.6 C) 97.7 F (36.5 C)  TempSrc:   Oral Oral  SpO2: 100% 100%  93%  Weight:      Height:       Physical Exam Vitals and nursing  note reviewed.  Constitutional:      General: She is awake.     Appearance: She is normal weight.     Comments: Frail.  Chronically ill-appearing.  HENT:     Head: Normocephalic.     Nose: No rhinorrhea.     Mouth/Throat:     Mouth: Mucous membranes are moist.  Eyes:     General: No scleral icterus.    Pupils: Pupils are equal, round, and reactive to light.  Neck:     Vascular: No JVD.  Cardiovascular:     Rate and Rhythm: Normal rate and regular rhythm.     Heart sounds: S1 normal and S2 normal.  Pulmonary:     Breath sounds: Normal breath sounds.  Abdominal:     General: Bowel sounds are normal. There is no distension.     Palpations: Abdomen is soft.     Tenderness: There is abdominal tenderness in the suprapubic area. There is no right CVA tenderness, left CVA tenderness or guarding.  Musculoskeletal:     Cervical back: Neck supple.     Right lower leg: No edema.     Left lower leg: No edema.  Skin:    General: Skin is warm and dry.     Comments: Multiple small areas of ecchymosis on extremities.  Neurological:     General: No focal deficit present.     Mental Status: She is alert. She is disoriented.  Psychiatric:        Mood and Affect: Mood normal.        Behavior: Behavior  normal. Behavior is cooperative.    Data Reviewed:  There are no new results to review at this time.  9/22 echocardiogram  IMPRESSIONS     1. Left ventricular ejection fraction, by estimation, is 60 to 65%. The  left ventricle has normal function. The left ventricle has no regional  wall motion abnormalities. Left ventricular diastolic parameters are  consistent with Grade I diastolic  dysfunction (impaired relaxation).   2. Right ventricular systolic function is normal. The right ventricular  size is mildly enlarged. There is normal pulmonary artery systolic  pressure.   3. The mitral valve is normal in structure. Trivial mitral valve  regurgitation. No evidence of mitral stenosis.   4. The aortic valve is tricuspid. Aortic valve regurgitation is trivial.  No aortic stenosis is present.   5. The inferior vena cava is normal in size with greater than 50%  respiratory variability, suggesting right atrial pressure of 3 mmHg.   Assessment and Plan: Principal Problem:   Acute metabolic encephalopathy Secondary to:   Acute lower UTI Mental status improved per daughter. Admit to telemetry/inpatient. Continue gentle IV fluids. Continue ceftriaxone 1 g IVPB daily. Follow-up urine culture and sensitivity Follow CBC and CMP in a.m.  Active Problems:   Multiple rib fractures   Closed fracture of multiple thoracic vertebrae (HCC) Oxycodone 2.5 mg every 6 hours as needed. Toradol 15 mg every 8 hours IVP as needed. Fentanyl 25 mcg IVP as needed for severe pain. Physical therapy evaluation after UTI has been treated.    Essential hypertension Continue lisinopril 20 mg p.o. daily. As needed hydralazine tablets. Monitor blood pressure and heart rate. Monitor renal function and electrolytes.    Hyperlipidemia Continue atorvastatin 40 mg p.o. daily.    Chronic diastolic CHF (congestive heart failure) (HCC) No signs of decompensation. Continue lisinopril 20 mg p.o. daily.     Depression Continue bupropion 150 mg p.o. twice daily.  Continue mirtazapine 15 mg p.o. bedtime. Continue sertraline 100 mg p.o. in the morning. Continue clonazepam for anxiety as needed.    Advance Care Planning:   Code Status: Prior   Consults:   Family Communication:   Severity of Illness: The appropriate patient status for this patient is INPATIENT. Inpatient status is judged to be reasonable and necessary in order to provide the required intensity of service to ensure the patient's safety. The patient's presenting symptoms, physical exam findings, and initial radiographic and laboratory data in the context of their chronic comorbidities is felt to place them at high risk for further clinical deterioration. Furthermore, it is not anticipated that the patient will be medically stable for discharge from the hospital within 2 midnights of admission.   * I certify that at the point of admission it is my clinical judgment that the patient will require inpatient hospital care spanning beyond 2 midnights from the point of admission due to high intensity of service, high risk for further deterioration and high frequency of surveillance required.*  Author: Reubin Milan, MD 08/26/2021 3:02 PM  For on call review [email or web address].   This document was prepared using Dragon voice recognition software and may contain some unintended transcription errors.

## 2021-08-26 NOTE — Progress Notes (Signed)
TRH admitting physician addendum:  The nursing staff communicated to me that the patient was having chest pain.  She seems to have become anxious after her daughter left.  No nausea, dyspnea or diaphoresis. Most recent vital signs temperature 98.1 F, pulse 66, respiration 18, blood pressure 192/64 mmHg O2 sat 98% on nasal cannula oxygen.  Lungs decreased inspiration at the bases, otherwise clear.  Heart S1-S2, RRR.  Her EKG was nonischemic Vent. rate 63 BPM PR interval 154 ms QRS duration 70 ms QT/QTcB 396/405 ms P-R-T axes 63 7 73 Normal sinus rhythm Low voltage QRS Borderline ECG She received fentanyl for pain.  I did order metoprolol 5 mg IVP and troponin x2.  The night shift nursing staff will notify night shift floor coverage if troponin is positive.  Sanda Klein, MD.

## 2021-08-26 NOTE — ED Notes (Signed)
Patient had incontinence, pericare performed and linens changed.

## 2021-08-26 NOTE — Progress Notes (Signed)
Patient called RN back to room stating her pain was completely gone.  Troponins pending.  Night shift aware of incident.

## 2021-08-26 NOTE — ED Triage Notes (Addendum)
BIB [facility] from [facility] following a mechanical fall yesterday, landed on her back. Hx of L rib fx diagnosed in late July. Given 50 mcg fent PTA.   Being tx for UTI.

## 2021-08-26 NOTE — ED Notes (Signed)
Sats 84-85% after fentanyl placed on 2L [place]

## 2021-08-26 NOTE — ED Notes (Signed)
Pt in pain when trying to sit up in bed while trying to ambulate.

## 2021-08-27 DIAGNOSIS — G9341 Metabolic encephalopathy: Secondary | ICD-10-CM | POA: Diagnosis not present

## 2021-08-27 MED ORDER — FENTANYL CITRATE PF 50 MCG/ML IJ SOSY
25.0000 ug | PREFILLED_SYRINGE | INTRAMUSCULAR | Status: AC | PRN
  Administered 2021-08-27 – 2021-08-28 (×2): 25 ug via INTRAVENOUS
  Filled 2021-08-27 (×2): qty 1

## 2021-08-27 MED ORDER — SODIUM CHLORIDE 0.9 % IV SOLN: INTRAVENOUS | Status: DC | PRN

## 2021-08-27 MED ORDER — CLONAZEPAM 0.125 MG PO TBDP
0.2500 mg | ORAL_TABLET | Freq: Every day | ORAL | Status: DC | PRN
  Administered 2021-08-27: 0.25 mg via ORAL
  Filled 2021-08-27: qty 2

## 2021-08-27 NOTE — TOC Initial Note (Signed)
Transition of Care [facility]) - Initial/Assessment Note    Patient Details  Name: Samantha Crane MRN: [id number] Date of Birth: 1929-04-21  Transition of Care [facility]) CM/SW Contact:    Golda Acre, RN Phone Number: 08/27/2021, 8:20 AM  Clinical Narrative:                 Reqest for snf placement from md.  Fl2 done passar pending.  Expected Discharge Plan: Skilled Nursing Facility Barriers to Discharge: Continued Medical Work up   Patient Goals and CMS Choice Patient states their goals for this hospitalization and ongoing recovery are:: to get better CMS Medicare.gov Compare Post Acute Care list provided to:: Patient Choice offered to / list presented to : Patient  Expected Discharge Plan and Services Expected Discharge Plan: Skilled Nursing Facility   Discharge Planning Services: CM Consult Post Acute Care Choice: Skilled Nursing Facility Living arrangements for the past 2 months: Apartment                                      Prior Living Arrangements/Services Living arrangements for the past 2 months: Apartment Lives with:: Spouse Patient language and need for interpreter reviewed:: Yes Do you feel safe going back to the place where you live?: Yes            Criminal Activity/Legal Involvement Pertinent to Current Situation/Hospitalization: No - Comment as needed  Activities of Daily Living Home Assistive Devices/Equipment: Eyeglasses, Environmental consultant (specify type), Shower chair without back ADL Screening (condition at time of admission) Patient's cognitive ability adequate to safely complete daily activities?: Yes Is the patient deaf or have difficulty hearing?: No Does the patient have difficulty seeing, even when wearing glasses/contacts?: No Does the patient have difficulty concentrating, remembering, or making decisions?: No Patient able to express need for assistance with ADLs?: Yes Does the patient have difficulty dressing or bathing?: Yes Independently  performs ADLs?: No Communication: Independent Dressing (OT): Needs assistance Is this a change from baseline?: Pre-admission baseline Grooming: Needs assistance Is this a change from baseline?: Pre-admission baseline Feeding: Independent Bathing: Needs assistance Is this a change from baseline?: Pre-admission baseline Toileting: Needs assistance Is this a change from baseline?: Pre-admission baseline In/Out Bed: Needs assistance Is this a change from baseline?: Pre-admission baseline Walks in Home: Needs assistance Is this a change from baseline?: Pre-admission baseline Does the patient have difficulty walking or climbing stairs?: Yes Weakness of Legs: Both Weakness of Arms/Hands: None  Permission Sought/Granted                  Emotional Assessment Appearance:: Appears stated age Attitude/Demeanor/Rapport: Engaged Affect (typically observed): Calm Orientation: : Oriented to Place, Oriented to Self, Oriented to  Time, Oriented to Situation Alcohol / Substance Use: Tobacco Use (histroy of) Psych Involvement: No (comment)  Admission diagnosis:  Acute cystitis with hematuria [N30.01] Altered mental status, unspecified altered mental status type [R41.82] Acute metabolic encephalopathy [G93.41] Patient Active Problem List   Diagnosis Date Noted   Acute metabolic encephalopathy 08/26/2021   Depression 08/26/2021   Multiple rib fractures 08/26/2021   Closed fracture of multiple thoracic vertebrae (HCC) 08/26/2021   Fracture of one rib, left side, initial encounter for closed fracture 01/23/2021   Weakness 09/22/2020   TIA (transient ischemic attack)    Frailty 09/13/2020   Acute on chronic respiratory failure with hypoxia (HCC) 08/20/2020   Chronic diastolic CHF (congestive heart failure) (HCC) 08/20/2020  Sepsis (HCC) 08/19/2020   Cellulitis of left leg 08/18/2020   COVID-19 01/13/2019   Hyponatremia 12/18/2016   Incomplete emptying of bladder 11/15/2016   Prolapse  of female genital organs 10/21/2016   Acute lower UTI 10/21/2016   Vaginal atrophy 10/21/2016   Right sciatic nerve pain 07/08/2016   Chronic nonseasonal allergic rhinitis due to pollen 02/09/2016   Environmental allergies 02/09/2016   Allergic rhinitis 11/29/2015   Cough 11/29/2015   History of food allergy 11/29/2015   Primary insomnia 06/05/2015   Subacute maxillary sinusitis 01/06/2015   Chronic diarrhea 06/20/2014   Chronic kidney disease (CKD) stage G3a/A1, moderately decreased glomerular filtration rate (GFR) between 45-59 mL/min/1.73 square meter and albuminuria creatinine ratio less than 30 mg/g (HCC) 11/30/2013   Alopecia 03/17/2013   Dermatitis 12/04/2012   B-complex deficiency 11/19/2012   Essential tremor 11/19/2012   Duodenal ulcer with hemorrhage 05/20/2012   Orthostatic tremor 02/19/2012   Carotid stenosis, bilateral 09/17/2011   Stenosis of carotid artery 09/17/2011   Hyperlipidemia 04/16/2011   Personal history of transient ischemic attack (TIA), and cerebral infarction without residual deficits 03/04/2011   Vitamin D deficiency 09/20/2010   Anxiety 08/08/2010   Essential hypertension 08/08/2010   Irritable bowel syndrome without diarrhea 08/08/2010   Major depressive disorder, single episode 08/08/2010   Spinal stenosis 08/08/2007   PCP:  Nadara [place], MD Pharmacy:   [facility] - [place], [place] - [address] [place] [address] Phone: [phone] Fax: [phone]  [facility] - [place], [place] - [address] [place] [address] Phone: [phone] Fax: [phone]     Social Determinants of Health (SDOH) Interventions    Readmission Risk Interventions    08/22/2020    8:38 AM  Readmission Risk Prevention Plan  HRI or Home Care Consult Complete  Social Work Consult for Recovery Care Planning/Counseling Complete  Palliative Care Screening Not Applicable

## 2021-08-27 NOTE — NC FL2 (Signed)
[place] LEVEL OF CARE SCREENING TOOL     IDENTIFICATION  Patient Name: Samantha Crane Birthdate: [birth year] Sex: female Admission Date (Current Location): 08/26/2021  [facility] and [place] Number:  Herbalist and Address:  [facility],  [place], [place]      Provider Number: [id number]  Attending Physician Name and Address:  Dessa Phi, DO  Relative Name and Phone Number:       Current Level of Care: Hospital Recommended Level of Care: [place] Prior Approval Number:  [id number]  Date Approved/Denied:  [id number] PASRR Number: pending  Discharge Plan: SNF    Current Diagnoses: Patient Active Problem List   Diagnosis Date Noted   Acute metabolic encephalopathy [id number]   Depression 08/26/2021   Multiple rib fractures 08/26/2021   Closed fracture of multiple thoracic vertebrae ([place]) 08/26/2021   Fracture of one rib, left side, initial encounter for closed fracture 01/23/2021   Weakness 09/22/2020   TIA (transient ischemic attack)    Frailty 09/13/2020   Acute on chronic respiratory failure with hypoxia ([place]) 08/20/2020   Chronic diastolic CHF (congestive heart failure) ([place]) 08/20/2020   Sepsis ([place]) 08/19/2020   Cellulitis of left leg 08/18/2020   COVID-19 01/13/2019   Hyponatremia 12/18/2016   Incomplete emptying of bladder 11/15/2016   Prolapse of female genital organs 10/21/2016   Acute lower UTI 10/21/2016   Vaginal atrophy 10/21/2016   Right sciatic nerve pain 07/08/2016   Chronic nonseasonal allergic rhinitis due to pollen 02/09/2016   Environmental allergies 02/09/2016   Allergic rhinitis 11/29/2015   Cough 11/29/2015   History of food allergy 11/29/2015   Primary insomnia 06/05/2015   Subacute maxillary sinusitis 01/06/2015   Chronic diarrhea 06/20/2014   Chronic kidney disease (CKD) stage G3a/A1, moderately decreased glomerular filtration rate (GFR) between 45-59  mL/min/1.73 square meter and albuminuria creatinine ratio less than 30 mg/g (HCC) 11/30/2013   Alopecia 03/17/2013   Dermatitis 12/04/2012   B-complex deficiency 11/19/2012   Essential tremor 11/19/2012   Duodenal ulcer with hemorrhage 05/20/2012   Orthostatic tremor 02/19/2012   Carotid stenosis, bilateral 09/17/2011   Stenosis of carotid artery 09/17/2011   Hyperlipidemia 04/16/2011   Personal history of transient ischemic attack (TIA), and cerebral infarction without residual deficits 03/04/2011   Vitamin D deficiency 09/20/2010   Anxiety 08/08/2010   Essential hypertension 08/08/2010   Irritable bowel syndrome without diarrhea 08/08/2010   Major depressive disorder, single episode 08/08/2010   Spinal stenosis 08/08/2007    Orientation RESPIRATION BLADDER Height & Weight     Self, Time, Situation, Place  Normal Continent Weight: 59 kg Height:  5\' 4"  (162.6 cm)  BEHAVIORAL SYMPTOMS/MOOD NEUROLOGICAL BOWEL NUTRITION STATUS      Continent Diet (regular)  AMBULATORY STATUS COMMUNICATION OF NEEDS Skin   Extensive Assist Verbally Normal                       Personal Care Assistance Level of Assistance  Bathing, Feeding, Dressing Bathing Assistance: Independent Feeding assistance: Independent Dressing Assistance: Independent     Functional Limitations Info  Sight, Hearing, Speech Sight Info: Adequate Hearing Info: Adequate Speech Info: Adequate    SPECIAL CARE FACTORS FREQUENCY  PT (By licensed PT), OT (By licensed OT)     PT Frequency: 5 x weekly OT Frequency: 5 x weekly            Contractures Contractures Info: Not present    Additional Factors Info  Code Status Code Status Info: full             Current Medications (08/27/2021):  This is the current hospital active medication list Current Facility-Administered Medications  Medication Dose Route Frequency Provider Last Rate Last Admin   acetaminophen (TYLENOL) tablet 650 mg  650 mg Oral Q6H PRN  Bobette Mo, MD       Or   acetaminophen (TYLENOL) suppository 650 mg  650 mg Rectal Q6H PRN Bobette Mo, MD       aspirin tablet 325 mg  325 mg Oral Daily Bobette Mo, MD   325 mg at 08/26/21 1605   atorvastatin (LIPITOR) tablet 40 mg  40 mg Oral QHS Bobette Mo, MD   40 mg at 08/26/21 2142   buPROPion (WELLBUTRIN SR) 12 hr tablet 150 mg  150 mg Oral BID Bobette Mo, MD   150 mg at 08/26/21 2142   cefTRIAXone (ROCEPHIN) 1 g in sodium chloride 0.9 % 100 mL IVPB  1 g Intravenous Q24H Bobette Mo, MD       conjugated estrogens (PREMARIN) vaginal cream 1 Applicatorful  1 Applicatorful Vaginal Q M,W,F Bobette Mo, MD       enoxaparin (LOVENOX) injection 40 mg  40 mg Subcutaneous QHS Bobette Mo, MD   40 mg at 08/26/21 2142   fentaNYL (SUBLIMAZE) injection 25 mcg  25 mcg Intravenous Q2H PRN Bobette Mo, MD   25 mcg at 08/26/21 1840   ferrous sulfate tablet 325 mg  325 mg Oral q morning Bobette Mo, MD       fluticasone [facility]) 50 MCG/ACT nasal spray 1 spray  1 spray Each Nare BID Bobette Mo, MD       gabapentin (NEURONTIN) capsule 200 mg  200 mg Oral Daily Bobette Mo, MD   200 mg at 08/26/21 1605   hydrALAZINE (APRESOLINE) tablet 25 mg  25 mg Oral Q6H PRN Bobette Mo, MD       ketorolac (TORADOL) 15 MG/ML injection 15 mg  15 mg Intravenous Q8H PRN Bobette Mo, MD   15 mg at 08/27/21 0654   lisinopril (ZESTRIL) tablet 20 mg  20 mg Oral q morning Bobette Mo, MD       loratadine (CLARITIN) tablet 10 mg  10 mg Oral Daily Bobette Mo, MD   10 mg at 08/26/21 1605   mirabegron ER (MYRBETRIQ) tablet 25 mg  25 mg Oral Daily Bobette Mo, MD   25 mg at 08/26/21 1605   mirtazapine (REMERON) tablet 15 mg  15 mg Oral QHS Bobette Mo, MD   15 mg at 08/26/21 2142   ondansetron (ZOFRAN) tablet 4 mg  4 mg Oral Q6H PRN Bobette Mo, MD       Or   ondansetron  [facility]) injection 4 mg  4 mg Intravenous Q6H PRN Bobette Mo, MD   4 mg at 08/27/21 0102   Oral care mouth rinse  15 mL Mouth Rinse PRN Bobette Mo, MD       oxyCODONE (Oxy IR/ROXICODONE) immediate release tablet 2.5 mg  2.5 mg Oral Q6H PRN Bobette Mo, MD       pantoprazole (PROTONIX) EC tablet 40 mg  40 mg Oral q morning Bobette Mo, MD       sertraline (ZOLOFT) tablet 100 mg  100 mg Oral q morning Bobette Mo, MD  Discharge Medications: Please see discharge summary for a list of discharge medications.  Relevant Imaging Results:  Relevant Lab Results:   Additional Information SSN: [id number]  Golda Acre, RN

## 2021-08-27 NOTE — Evaluation (Signed)
Physical Therapy Evaluation Patient Details Name: Samantha Crane MRN: [id number] DOB: November 06, 1929 Today's Date: 08/27/2021  History of Present Illness  Patient is a 88 year old female who presented to the hosptial after a fall at ALF resulting in L side chest pain. patient was found to have " thoracic spine CT came back with a T8 vertebral compression fracture with 25% body height loss and a 3 mm bony retropulsion centrally with no evidence suggesting significant cannula stenosis at this level.  There are acute nondisplaced fractures of the left-sided transverse processes of T8, T9, T10 and T11.  There are nondisplaced posterior rib fractures at the same levels with involvement of the costovertebral junctions ribs 10 and 11.  There are subacute appearing fractures of the posterior right 10th and 11th ribs with bony callus formation.  There are chronic compression fractures of T9 and T10.". PPI:RJJOACZ, depression, IBS, HTN, spinal stenosis   Clinical Impression  On eval, pt required Min A +2 for safe mobility. She walked ~ 50 feet with a RW. Moderate pain with activity. Daughter reports pt has had some emotional lability on today-she is wondering if it is due to the pain meds-encouraged her to speak with RN/MD. Pt remained on [place] O2 2L for ambulation-sats 99%. Pt has significant tremors when standing + ambulating. She also has some anxiety and is fearful of falling. Plan is for ST rehab at SNF.      Recommendations for follow up therapy are one component of a multi-disciplinary discharge planning process, led by the attending physician.  Recommendations may be updated based on patient status, additional functional criteria and insurance authorization.  Follow Up Recommendations Skilled nursing-short term rehab (<3 hours/day) Can patient physically be transported by private vehicle: Yes    Assistance Recommended at Discharge Frequent or constant Supervision/Assistance  Patient can return home with the  following  Assist for transportation;Assistance with cooking/housework;Help with stairs or ramp for entrance;A lot of help with walking and/or transfers;A lot of help with bathing/dressing/bathroom    Equipment Recommendations None recommended by PT  Recommendations for Other Services       Functional Status Assessment Patient has had a recent decline in their functional status and demonstrates the ability to make significant improvements in function in a reasonable and predictable amount of time.     Precautions / Restrictions Precautions Precautions: Fall Precaution Comments: back precautions, monitor O2 Restrictions Weight Bearing Restrictions: No      Mobility  Bed Mobility               General bed mobility comments: oob in recliner    Transfers Overall transfer level: Needs assistance Equipment used: Rolling walker (2 wheels) Transfers: Sit to/from Stand Sit to Stand: Min assist, +2 physical assistance, +2 safety/equipment           General transfer comment: Assist to rise, steady, control descent. Cues for safety, technique, hand placement    Ambulation/Gait Ambulation/Gait assistance: Min assist, +2 physical assistance, +2 safety/equipment Gait Distance (Feet): 50 Feet Assistive device: Rolling walker (2 wheels) Gait Pattern/deviations: Ataxic, Step-to pattern       General Gait Details: Mildy ataxic gait . Significant tremors especially initially. O2 99% on 2L. Assist to stabilize pt throughout distance. Followed with recliner and used it to transport pt back to the room.  Stairs            Wheelchair Mobility    Modified Rankin (Stroke Patients Only)       Balance Overall balance  assessment: Needs assistance, History of Falls         Standing balance support: Bilateral upper extremity supported Standing balance-Leahy Scale: Poor                               Pertinent Vitals/Pain Pain Assessment Pain Assessment:  Faces Faces Pain Scale: Hurts little more Pain Location: back with movement Pain Descriptors / Indicators: Discomfort, Sore Pain Intervention(s): Limited activity within patient's tolerance, Monitored during session, Repositioned, Premedicated before session    Home Living Family/patient expects to be discharged to:: Assisted living                 Home Equipment: Rollator (4 wheels) Additional Comments: patient lives at [facility] at this time.    Prior Function Prior Level of Function : Needs assist             Mobility Comments: ambulates with walker ADLs Comments: patient reported having someone with her for showers and ADls for last month or so. prior to this she reported being independent. patients daughter in room stating the same     Hand Dominance   Dominant Hand: Right    Extremity/Trunk Assessment   Upper Extremity Assessment Upper Extremity Assessment: Defer to OT evaluation    Lower Extremity Assessment Lower Extremity Assessment: Generalized weakness    Cervical / Trunk Assessment Cervical / Trunk Assessment: Kyphotic (flexed neck/trunk)  Communication   Communication: No difficulties  Cognition Arousal/Alertness: Awake/alert Behavior During Therapy: Anxious                                   General Comments: mostly WFL. she intermittently drifts off-likely due to meds. anxiety and tremors        General Comments      Exercises     Assessment/Plan    PT Assessment Patient needs continued PT services  PT Problem List Decreased strength;Decreased mobility;Decreased activity tolerance;Decreased balance;Decreased knowledge of use of DME;Pain       PT Treatment Interventions DME instruction;Gait training;Therapeutic activities;Therapeutic exercise;Patient/family education;Balance training;Functional mobility training    PT Goals (Current goals can be found in the Care Plan section)  Acute Rehab PT Goals Patient  Stated Goal: less pain. regain PLOF/independence PT Goal Formulation: With patient Time For Goal Achievement: 09/10/21 Potential to Achieve Goals: Good    Frequency Min 3X/week     Co-evaluation               AM-PAC PT "6 Clicks" Mobility  Outcome Measure Help needed turning from your back to your side while in a flat bed without using bedrails?: A Lot Help needed moving from lying on your back to sitting on the side of a flat bed without using bedrails?: A Lot Help needed moving to and from a bed to a chair (including a wheelchair)?: A Lot Help needed standing up from a chair using your arms (e.g., wheelchair or bedside chair)?: A Lot Help needed to walk in hospital room?: A Lot Help needed climbing 3-5 steps with a railing? : Total 6 Click Score: 11    End of Session Equipment Utilized During Treatment: Gait belt;Oxygen Activity Tolerance: Patient tolerated treatment well Patient left: in chair;with call bell/phone within reach;with family/visitor present;with chair alarm set        Time: 9024-0973 PT Time Calculation (min) (ACUTE ONLY): 15 min  Charges:   PT Evaluation $PT Eval Moderate Complexity: 1 Mod            Faye Ramsay, PT Acute Rehabilitation  Office: [phone] Pager: [phone]

## 2021-08-27 NOTE — Evaluation (Signed)
Occupational Therapy Evaluation Patient Details Name: Eugenia Eldredge MRN: [id number] DOB: [birth year] Today's Date: 08/27/2021   History of Present Illness Patient is a 88 year old female who presented to the hosptial after a fall at ALF resulting in L side chest pain. patient was found to have " thoracic spine CT came back with a T8 vertebral compression fracture with 25% body height loss and a 3 mm bony retropulsion centrally with no evidence suggesting significant cannula stenosis at this level.  There are acute nondisplaced fractures of the left-sided transverse processes of T8, T9, T10 and T11.  There are nondisplaced posterior rib fractures at the same levels with involvement of the costovertebral junctions ribs 10 and 11.  There are subacute appearing fractures of the posterior right 10th and 11th ribs with bony callus formation.  There are chronic compression fractures of T9 and T10.". OBS:[id number], depression, IBS, HTN, spinal stenosis   Clinical Impression   Patient is a 88 year old female who was living [facility]. Patient was supervision for ADLs prior to this fall. Patient was noted to have decreased functional activity tolerance, decreased endurance, decreased standing balance, decreased safety awareness, and decreased knowledge of AD/AE impacting participation in ADLs. Patient would continue to benefit from skilled OT services at this time while admitted and after d/c to address noted deficits in order to improve overall safety and independence in ADLs.       Recommendations for follow up therapy are one component of a multi-disciplinary discharge planning process, led by the attending physician.  Recommendations may be updated based on patient status, additional functional criteria and insurance authorization.   Follow Up Recommendations  Skilled nursing-short term rehab (<3 hours/day)    Assistance Recommended at Discharge Frequent or constant Supervision/Assistance   Patient can return home with the following Two people to help with walking and/or transfers;A lot of help with bathing/dressing/bathroom;Assistance with cooking/housework;Direct supervision/assist for financial management;Assist for transportation;Help with stairs or ramp for entrance;Direct supervision/assist for medications management    Functional Status Assessment  Patient has had a recent decline in their functional status and demonstrates the ability to make significant improvements in function in a reasonable and predictable amount of time.  Equipment Recommendations  Other (comment) (defer to next venue)    Recommendations for Other Services       Precautions / Restrictions Precautions Precautions: Fall Precaution Comments: back precautions, monitor O2 Restrictions Weight Bearing Restrictions: No      Mobility Bed Mobility Overal bed mobility: Needs Assistance Bed Mobility: Supine to Sit     Supine to sit: Max assist     General bed mobility comments: with education on log rolling to reduce risks of bending or twisting.    Transfers                          Balance Overall balance assessment: Mild deficits observed, not formally tested                                         ADL either performed or assessed with clinical judgement   ADL Overall ADL's : Needs assistance/impaired Eating/Feeding: Set up;Sitting   Grooming: Minimal assistance;Sitting;Oral care Grooming Details (indicate cue type and reason): with increased time. patient asked about electric toothbrushes. patietn was educated that family could bring in one if she wanted from home  Upper Body Bathing: Minimal assistance;Sitting   Lower Body Bathing: Maximal assistance;Bed level   Upper Body Dressing : Minimal assistance;Sitting   Lower Body Dressing: Maximal assistance;Bed level   Toilet Transfer: Maximal assistance;Rolling walker (2 wheels) Toilet Transfer Details  (indicate cue type and reason): with increased time and posterior leaning noted. patient had increased tremors in BUE with movement with patient and daugher reporting they get bad with anxiety and pain. patient needed step by step sequencing for transfer with physical assist to maintain standing balance and turn RW. Toileting- Clothing Manipulation and Hygiene: Total assistance;Bed level               Vision Patient Visual Report: No change from baseline       Perception     Praxis      Pertinent Vitals/Pain Pain Assessment Pain Assessment: Faces Faces Pain Scale: Hurts even more Pain Location: back with movement Pain Descriptors / Indicators: Discomfort, Grimacing Pain Intervention(s): Premedicated before session, Repositioned, Monitored during session, Limited activity within patient's tolerance     Hand Dominance Right   Extremity/Trunk Assessment Upper Extremity Assessment Upper Extremity Assessment: Generalized weakness (did not test MMT with new spinal fx and rib fx)   Lower Extremity Assessment Lower Extremity Assessment: Defer to PT evaluation   Cervical / Trunk Assessment Cervical / Trunk Assessment: Kyphotic   Communication Communication Communication: No difficulties   Cognition Arousal/Alertness: Awake/alert Behavior During Therapy: Anxious Overall Cognitive Status: Difficult to assess                                 General Comments: patient was noted to speak with eyes closed during session reporting " i think better with them closed". daughter was present in room and able to provide PLOF. patient noted to need increased time to process cues.     General Comments       Exercises     Shoulder Instructions      Home Living Family/patient expects to be discharged to:: Assisted living                             Home Equipment: Rollator (4 wheels)   Additional Comments: patient lives at [facility] at this time.       Prior Functioning/Environment                 ADLs Comments: patient reported having someone with her for showers and ADls for last month or so. prior to this she reported being independent. patients daughter in room stating the same        OT Problem List: Decreased activity tolerance;Impaired balance (sitting and/or standing);Decreased safety awareness;Pain;Decreased knowledge of precautions;Decreased knowledge of use of DME or AE;Cardiopulmonary status limiting activity      OT Treatment/Interventions: Self-care/ADL training;Therapeutic exercise;Neuromuscular education;Energy conservation;DME and/or AE instruction;Therapeutic activities;Patient/family education;Balance training    OT Goals(Current goals can be found in the care plan section) Acute Rehab OT Goals Patient Stated Goal: to get pain under control OT Goal Formulation: With patient Time For Goal Achievement: 09/10/21 Potential to Achieve Goals: Fair  OT Frequency: Min 2X/week    Co-evaluation              AM-PAC OT "6 Clicks" Daily Activity     Outcome Measure Help from another person eating meals?: A Little Help from another person taking care of personal grooming?:  A Little Help from another person toileting, which includes using toliet, bedpan, or urinal?: Total Help from another person bathing (including washing, rinsing, drying)?: A Lot Help from another person to put on and taking off regular upper body clothing?: A Little Help from another person to put on and taking off regular lower body clothing?: A Lot 6 Click Score: 14   End of Session Equipment Utilized During Treatment: Gait belt;Rolling walker (2 wheels) Nurse Communication: Other (comment) (ok to participate in session. coordinated pain medications for session)  Activity Tolerance: Patient tolerated treatment well Patient left: in chair;with call bell/phone within reach;with chair alarm set;with family/visitor present  OT Visit  Diagnosis: History of falling (Z91.81);Unsteadiness on feet (R26.81);Pain;Muscle weakness (generalized) (M62.81)                Time: 2353-6144 OT Time Calculation (min): 36 min Charges:  OT General Charges $OT Visit: 1 Visit OT Evaluation $OT Eval Moderate Complexity: 1 Mod OT Treatments $Self Care/Home Management : 8-22 mins  Sharyn Blitz OTR/L, MS Acute Rehabilitation Department Office# [phone] Pager# [phone]   Ardyth Harps 08/27/2021, 12:14 PM

## 2021-08-27 NOTE — Progress Notes (Signed)
PROGRESS NOTE    Samantha Crane  [id number] DOB: 27-May-1929 DOA: 08/26/2021 PCP: Javier Glazier, MD     Brief Narrative:  Samantha Crane is a 88 y.o. female with medical history significant of anxiety, depression, CKD, hyperlipidemia, hypertension, IBS, chronic diarrhea, PUD with hemorrhage, seasonal allergies, spinal stenosis, orthostatic tremor, vitamin D deficiency, TIA who is coming to the emergency department due to having a fall at her facility landing backwards and on her left sided chest.  She had a fall last month sustaining a rib fracture.  She has been having and UTI and has been treated with K-Flex for the past 3 days.  However, her daughter state that she has been confused for the past 5 days.  No fever to the daughter's knowledge.  She is able to answer simple questions and stated that she only has pain when moving.  No headache, chest or abdominal pain or nausea at the moment.  Urinalysis showed positive nitrates, large leukocyte esterase, more than 50 WBC and rare bacteria microscopic examination.  Lumbar, rib series x-rays, CT head and CT lumbar spine with no acute abnormality.  However, thoracic spine CT came back with a T8 vertebral compression fracture with 25% body height loss and a 3 mm bony retropulsion centrally with no evidence suggesting significant cannula stenosis at this level.  There are acute nondisplaced fractures of the left-sided transverse processes of T8, T9, T10 and T11.  There are nondisplaced posterior rib fractures at the same levels with involvement of the costovertebral junctions ribs 10 and 11.  There are subacute appearing fractures of the posterior right 10th and 11th ribs with bony callus formation.  There are chronic compression fractures of T9 and T10.  There is small left-sided and trace right-sided pleural effusions.  There is mild ascending thoracic aortic tach measuring 4.1 cm with annual imaging follow-up by CTA or MRA recommended.    Patient was  admitted for further treatment.  New events last 24 hours / Subjective: Patient admits to pain and nausea.  Assessment & Plan:  Principal Problem:   Acute metabolic encephalopathy Active Problems:   Essential hypertension   Hyperlipidemia   Acute lower UTI   Chronic diastolic CHF (congestive heart failure) (HCC)   Depression   Multiple rib fractures   Closed fracture of multiple thoracic vertebrae (HCC)   Acute metabolic encephalopathy -Seems to have improved  UTI, present on admission -UA positive for large leukocytes, positive nitrite, >50 WBC  -Urine culture is pending -Rocephin  Fall with multiple rib fractures, closed fracture of multiple thoracic vertebra -Pain control -Incentive spirometry -PT OT  Hypertension -Lisinopril  Hyperlipidemia -Lipitor  Chronic diastolic heart failure -Euvolemic  Mood disorder -Bupropion, mirtazapine, sertraline, clonazepam   DVT prophylaxis:  enoxaparin (LOVENOX) injection 40 mg Start: 08/26/21 2200  Code Status: Full code Family Communication: No family at bedside Disposition Plan:  Status is: Inpatient Remains inpatient appropriate because: Need PT OT, IV antibiotics, pain control   Antimicrobials:  Anti-infectives (From admission, onward)    Start     Dose/Rate Route Frequency Ordered Stop   08/27/21 1200  cefTRIAXone (ROCEPHIN) 1 g in sodium chloride 0.9 % 100 mL IVPB        1 g 200 mL/hr over 30 Minutes Intravenous Every 24 hours 08/26/21 1326     08/26/21 1200  cefTRIAXone (ROCEPHIN) 1 g in sodium chloride 0.9 % 100 mL IVPB        1 g 200 mL/hr over 30 Minutes Intravenous  Once 08/26/21 1148 08/26/21 1323        Objective: Vitals:   08/26/21 1836 08/26/21 1945 08/26/21 2301 08/27/21 0401  BP: (!) 192/64 (!) 175/59 (!) 145/55 (!) 148/64  Pulse: 66 (!) 46 (!) 52 (!) 56  Resp:  16 18 20   Temp: 98.1 F (36.7 C) (!) 97.5 F (36.4 C) (!) 97.5 F (36.4 C) 98 F (36.7 C)  TempSrc: Axillary Oral Oral Oral   SpO2: 98% 94% 97% 97%  Weight:      Height:        Intake/Output Summary (Last 24 hours) at 08/27/2021 1218 Last data filed at 08/27/2021 1047 Gross per 24 hour  Intake 220 ml  Output 250 ml  Net -30 ml   Filed Weights   08/26/21 0702 08/26/21 0705  Weight: 59 kg 59 kg    Examination:  General exam: Appears calm and comfortable  Respiratory system: Clear to auscultation. Respiratory effort normal. No respiratory distress. No conversational dyspnea.  Cardiovascular system: S1 & S2 heard, RRR. No murmurs. No pedal edema. Gastrointestinal system: Abdomen is nondistended, soft and nontender. Normal bowel sounds heard. Central nervous system: Alert and oriented. No focal neurological deficits. Speech clear.  Extremities: Symmetric in appearance  Skin: No rashes, lesions or ulcers on exposed skin  Psychiatry: Judgement and insight appear normal. Mood & affect appropriate.   Data Reviewed: I have personally reviewed following labs and imaging studies  CBC: Recent Labs  Lab 08/26/21 0927  WBC 11.3*  NEUTROABS 8.5*  HGB 13.8  HCT 40.7  MCV 93.8  PLT 241   Basic Metabolic Panel: Recent Labs  Lab 08/26/21 0927  NA 138  K 4.0  CL 99  CO2 26  GLUCOSE 92  BUN 13  CREATININE 0.73  CALCIUM 9.6   GFR: Estimated Creatinine Clearance: 41.2 mL/min (by C-G formula based on SCr of 0.73 mg/dL). Liver Function Tests: Recent Labs  Lab 08/26/21 0927  AST 23  ALT 18  ALKPHOS 73  BILITOT 0.6  PROT 7.3  ALBUMIN 4.2   No results for input(s): "LIPASE", "AMYLASE" in the last 168 hours. No results for input(s): "AMMONIA" in the last 168 hours. Coagulation Profile: No results for input(s): "INR", "PROTIME" in the last 168 hours. Cardiac Enzymes: No results for input(s): "CKTOTAL", "CKMB", "CKMBINDEX", "TROPONINI" in the last 168 hours. BNP (last 3 results) No results for input(s): "PROBNP" in the last 8760 hours. HbA1C: No results for input(s): "HGBA1C" in the last 72  hours. CBG: No results for input(s): "GLUCAP" in the last 168 hours. Lipid Profile: No results for input(s): "CHOL", "HDL", "LDLCALC", "TRIG", "CHOLHDL", "LDLDIRECT" in the last 72 hours. Thyroid Function Tests: No results for input(s): "TSH", "T4TOTAL", "FREET4", "T3FREE", "THYROIDAB" in the last 72 hours. Anemia Panel: No results for input(s): "VITAMINB12", "FOLATE", "FERRITIN", "TIBC", "IRON", "RETICCTPCT" in the last 72 hours. Sepsis Labs: No results for input(s): "PROCALCITON", "LATICACIDVEN" in the last 168 hours.  No results found for this or any previous visit (from the past 240 hour(s)).    Radiology Studies: CT Lumbar Spine Wo Contrast  Result Date: 08/26/2021 CLINICAL DATA:  Low back pain EXAM: CT LUMBAR SPINE WITHOUT CONTRAST TECHNIQUE: Multidetector CT imaging of the lumbar spine was performed without intravenous contrast administration. Multiplanar CT image reconstructions were also generated. RADIATION DOSE REDUCTION: This exam was performed according to the departmental dose-optimization program which includes automated exposure control, adjustment of the mA and/or kV according to patient size and/or use of iterative reconstruction technique. COMPARISON:  X-ray 08/26/2021, MRI 03/31/2019 FINDINGS: Segmentation: 5 lumbar type vertebrae. Alignment: 6 mm grade 1 anterolisthesis of L4 on L5, unchanged. Trace retrolisthesis at L5-S1. Vertebrae: Lumbar vertebral body heights are maintained without fracture. Bilateral rib fractures, as described on thoracic spine CT report. Degenerative endplate sclerosis at 075-GRM. No suspicious lytic or sclerotic bone lesion. Paraspinal and other soft tissues: Left greater than right pleural effusions with associated atelectasis. Extensive aortoiliac atherosclerosis. Disc levels: Advanced degenerative disc disease of L5-S1 with high-grade disc height loss. Vacuum disc phenomenon at L4-5 without significant height loss. Moderate bilateral lower lumbar  facet arthropathy. There could be mild canal stenosis at L4-5. At least mild right-sided foraminal stenosis at L5-S1. There could be mild canal stenosis on the left at L5-S1 and on the right at L4-5. IMPRESSION: 1. No acute fracture or traumatic malalignment of the lumbar spine. 2. Lower lumbar spondylosis, as described. 3. Bilateral rib fractures, as described on thoracic spine CT report. 4. Left greater than right pleural effusions with associated atelectasis. Aortic Atherosclerosis (ICD10-I70.0). Electronically Signed   By: Davina Poke D.O.   On: 08/26/2021 14:44   CT Thoracic Spine Wo Contrast  Result Date: 08/26/2021 CLINICAL DATA:  Mid-back pain upper back pain EXAM: CT THORACIC SPINE WITHOUT CONTRAST TECHNIQUE: Multidetector CT images of the thoracic were obtained using the standard protocol without intravenous contrast. RADIATION DOSE REDUCTION: This exam was performed according to the departmental dose-optimization program which includes automated exposure control, adjustment of the mA and/or kV according to patient size and/or use of iterative reconstruction technique. COMPARISON:  X-ray 09/22/2020, MRI 04/01/2018 FINDINGS: Alignment: Normal. Vertebrae: Acute compression fracture of the T8 vertebral body with approximately 25% vertebral body height loss centrally. Fracture lines involve the superior and inferior endplates as well as the anterior and posterior walls of the vertebral body. There is 3 mm of bony retropulsion centrally. No evidence of fracture extension into the posterior elements. Acute nondisplaced fractures of the left-sided transverse processes of T8, T9, T10, and T11. Acute nondisplaced posterior rib fractures of the left eighth through eleventh ribs with involvement of the costovertebral junctions at ribs 10 and 11. Subacute appearing fracture of the posterior right tenth and eleventh ribs with bony callus formation. Chronic moderate superior endplate compression fracture of the  T10 vertebral body with mild retropulsion. Subtle chronic superior endplate compression fracture of T9. These appear unchanged from the prior without progressive height loss. Remaining thoracic vertebral body heights are maintained. No additional fractures. No lytic or sclerotic bone lesion. Degenerative disc disease is visualized at the C6-7 level in the lower cervical spine. Paraspinal and other soft tissues: Small-moderate layering left-sided pleural effusion. Trace right-sided pleural effusion. Associated mild compressive atelectasis. Aortic atherosclerosis with mid ascending thoracic aorta measuring up to 4.1 cm in diameter. Pulmonary trunk measures 3.3 cm in diameter. Disc levels: No evidence of high-grade canal stenosis by CT. Disc heights are relatively well preserved. No significant facet arthropathy. IMPRESSION: 1. Acute compression fracture of the T8 vertebral body with approximately 25% vertebral body height loss centrally. There is 3 mm of bony retropulsion centrally. No evidence to suggest significant canal stenosis at this level. 2. Acute nondisplaced fractures of the left-sided transverse processes of T8, T9, T10, and T11. 3. Acute nondisplaced posterior rib fractures of the left eighth through eleventh ribs with involvement of the costovertebral junctions at ribs 10 and 11. 4. Subacute appearing fracture of the posterior right tenth and eleventh ribs with bony callus formation. 5. Chronic compression fractures of T9  and T10. 6. Small-moderate layering left-sided pleural effusion with trace right-sided pleural effusion. 7. Mid ascending thoracic aorta measuring up to 4.1 cm in diameter. Recommend annual imaging followup by CTA or MRA. This recommendation follows 2010 ACCF/AHA/AATS/ACR/ASA/SCA/SCAI/SIR/STS/SVM Guidelines for the Diagnosis and Management of Patients with Thoracic Aortic Disease. Circulation. 2010; 121: C789-F810. Aortic aneurysm NOS (ICD10-I71.9) 8. Aortic and coronary artery  atherosclerosis (ICD10-I70.0). Electronically Signed   By: Duanne Guess D.O.   On: 08/26/2021 14:38   DG Ribs Bilateral W/Chest  Result Date: 08/26/2021 CLINICAL DATA:  Fall yesterday.  Pain. EXAM: BILATERAL RIBS AND CHEST - 4+ VIEW COMPARISON:  Chest x-ray December 22, 2020 FINDINGS: The heart, hila, and mediastinum are unremarkable. No pneumothorax. Mild atelectasis in the left base. No rib fractures identified. IMPRESSION: No rib fractures identified. No pneumothorax. No acute abnormality. Electronically Signed   By: Gerome Sam III M.D.   On: 08/26/2021 09:23   DG Lumbar Spine Complete  Result Date: 08/26/2021 CLINICAL DATA:  back pain after fall EXAM: LUMBAR SPINE - COMPLETE 4+ VIEW COMPARISON:  Correlation is made with the MRI of the lumbar spine dated March 31, 2019 FINDINGS: There is no evidence of lumbar spine fracture. Minimal dextroconvex rotoscoliosis. About 6 mm anterolisthesis of L4 on L5. Moderate lumbar spondylosis with moderate facet hypertrophic changes more so at L3-L4 through L5-S1. Loss of the disc space at L5-S1 with endplate sclerosis. Moderately severe atherosclerotic calcifications of the abdominal aorta and iliac arteries. IMPRESSION: 1.  No fracture seen. 2. Moderate lumbar spondylosis with grade 1 anterolisthesis at L4-L5, stable. 3. Loss of the disc space with endplate sclerotic changes at L5-S1, stable. Electronically Signed   By: Marjo Bicker M.D.   On: 08/26/2021 09:20   CT Head Wo Contrast  Result Date: 08/26/2021 CLINICAL DATA:  Mental status change.  Fall. EXAM: CT HEAD WITHOUT CONTRAST TECHNIQUE: Contiguous axial images were obtained from the base of the skull through the vertex without intravenous contrast. RADIATION DOSE REDUCTION: This exam was performed according to the departmental dose-optimization program which includes automated exposure control, adjustment of the mA and/or kV according to patient size and/or use of iterative reconstruction technique.  COMPARISON:  CT scan of the brain September 22, 2020 FINDINGS: Brain: No subdural, epidural, or subarachnoid hemorrhage. Moderate to severe white matter changes are similar in the interval. Cerebellum, brainstem, and basal cisterns are normal. No cortical ischemia or interval infarct identified. No mass effect or midline shift. Ventricles and sulci are prominent but stable. Vascular: Calcified atherosclerotic changes are identified in the intracranial carotids. Skull: Normal. Negative for fracture or focal lesion. Sinuses/Orbits: No acute finding. Other: None. IMPRESSION: No acute intracranial abnormalities.  Chronic white matter changes. Electronically Signed   By: Gerome Sam III M.D.   On: 08/26/2021 08:38      Scheduled Meds:  aspirin  325 mg Oral Daily   atorvastatin  40 mg Oral QHS   buPROPion  150 mg Oral BID   conjugated estrogens  1 Applicatorful Vaginal Q M,W,F   enoxaparin (LOVENOX) injection  40 mg Subcutaneous QHS   ferrous sulfate  325 mg Oral q morning   fluticasone  1 spray Each Nare BID   gabapentin  200 mg Oral Daily   lisinopril  20 mg Oral q morning   loratadine  10 mg Oral Daily   mirabegron ER  25 mg Oral Daily   mirtazapine  15 mg Oral QHS   pantoprazole  40 mg Oral q morning   sertraline  100 mg Oral q morning   Continuous Infusions:  cefTRIAXone (ROCEPHIN)  IV       LOS: 1 day     Dessa Phi, DO [facility] 08/27/2021, 12:18 PM   Available via Epic secure chat 7am-7pm After these hours, please refer to coverage provider listed on [email or web address]

## 2021-08-28 DIAGNOSIS — Z7189 Other specified counseling: Secondary | ICD-10-CM | POA: Diagnosis not present

## 2021-08-28 DIAGNOSIS — G9341 Metabolic encephalopathy: Secondary | ICD-10-CM | POA: Diagnosis not present

## 2021-08-28 DIAGNOSIS — Z515 Encounter for palliative care: Secondary | ICD-10-CM | POA: Diagnosis not present

## 2021-08-28 LAB — TSH: TSH: 0.987 u[IU]/mL (ref 0.350–4.500)

## 2021-08-28 MED ORDER — CLONAZEPAM 0.125 MG PO TBDP: 0.2500 mg | ORAL_TABLET | Freq: Two times a day (BID) | ORAL | Status: DC | PRN

## 2021-08-28 MED ORDER — CLONAZEPAM 0.125 MG PO TBDP
0.2500 mg | ORAL_TABLET | Freq: Three times a day (TID) | ORAL | Status: DC
  Administered 2021-08-28 – 2021-08-30 (×7): 0.25 mg via ORAL
  Filled 2021-08-28 (×7): qty 2

## 2021-08-28 MED ORDER — CLONAZEPAM 0.125 MG PO TBDP: 0.2500 mg | ORAL_TABLET | Freq: Three times a day (TID) | ORAL | Status: DC

## 2021-08-28 NOTE — Consult Note (Cosign Needed Addendum)
Palliative Medicine Inpatient Consult Note  Consulting Provider: Dessa Phi, DO  Reason for consult:   [facility] Consult  Reason for Consult? Goals of care, poor lady with falls and multiple fractures, anxious   08/28/2021  HPI:  Per intake H&P --> Samantha Crane is a 88 y.o. female with medical history significant of anxiety, depression, CKD, hyperlipidemia, hypertension, IBS, chronic diarrhea, PUD with hemorrhage, seasonal allergies, spinal stenosis, orthostatic tremor, vitamin D deficiency, TIA who is coming to the emergency department due to having a fall at her facility landing backwards and on her left sided chest.   Palliative care has been asked to discuss goals of care in the setting of multiple chronic co-morbid conditions and recent falls.   Clinical Assessment/Goals of Care:  *Please note that this is a verbal dictation therefore any spelling or grammatical errors are due to the "[employer] One" system interpretation.  I have reviewed medical records including EPIC notes, labs and imaging, received report from bedside RN, assessed the patient she was sitting up in the chair in no acute distress.    I met with Samantha Crane and her daughter, Samantha Crane to further discuss diagnosis prognosis, GOC, EOL wishes, disposition and options.   I introduced Palliative Medicine as specialized medical care for people living with serious illness. It focuses on providing relief from the symptoms and stress of a serious illness. The goal is to improve quality of life for both the patient and the family.  Medical History Review and Understanding:  A brief review of Samantha Crane's past medical history was held inclusive of her anxiety, hypertension, irritable bowel syndrome, peptic ulcer disease, spinal stenosis, and prior TIA.  Social History:  Samantha Crane has lived in various places as her father was in the [employer].  Her husband is deceased though she has 2  children a son and a daughter.  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  She enjoys decorating her living space with holidays scenes.  She expresses that she has a love of the arts.  She shares that she is a woman of faith and practices within the [employer] denomination.  Functional and Nutritional State:  Prior to admission Samantha Crane had been living at [facility] where she is in their assisted living arm.  She requires help with bathing dressing and meal preparation.  She vocalizes that she does use a rollator for mobility though is not always compliant with that and as a result has suffered multiple falls.  Samantha Crane does have a fair appetite.  Palliative Symptoms:  Anxiety-this is an issue which Samantha Crane has contended with chronically.  Her daughter shares that there were some medication modifications at [facility] which led to some significant exacerbations and anxiety attacks.  The biggest culprit of this was suspected to be the cold [place] stopping of patient's gabapentin which she has been on for over 20 years.  As a result of this EMS was called while she was at her assisted living and patient had an anxiety attack so severe that it left her in a fetal position for over 2 hours.  Generalized weakness-in the setting of gait instability and multiple falls.  Reviewed the need for physical therapy and skilled nursing placement.  Advance Directives:  A detailed discussion was had today regarding advanced directives.  Patient's daughter vocalizes that she is her healthcare power of attorney and she shares she will bring in the documentation to support this.  Code Status:  Patient  is an established DO NOT RESUSCITATE/DO NOT INTUBATE CODE STATUS.  Discussion:  We reviewed that Samantha Crane is quite physically weak and as a result of this has had multiple falls.  Her daughter shares that she is not very compliant which is also a contributing factor.  We discussed the best case and  worst-case scenarios moving forward inclusive of Samantha Crane improving to the point whereby she can go back to the assisted living versus her continuing to decline to the point where she would require hospice care. Samantha Crane has a very strong-willed per Samantha Crane and is very motivated to improve her present health state.  She expressed to her daughter prior to admission a very polarizing statement of her "not being certain if she could improve this time."  For the time being goals will be for improvement, short-term placement at skilled nursing.  Regarding patient's anxiety she was supposed to meet with a psychiatrist at [facility] this past week though had never been seen.  Given the severity and multiple medications [name] is on for her depression and anxiety I shared that I would reach out to the primary hospitalist for consideration of a psychiatry consult in-house.  We also reviewed the likely need for an outpatient psychiatrist and psychologist moving forward.  Both patient and her daughter are open to outpatient palliative support through hospice of the [place].  Discussed the importance of continued conversation with family and their  medical providers regarding overall plan of care and treatment options, ensuring decisions are within the context of the patients values and GOCs.  Decision Maker: Samantha Crane,Samantha Crane (Daughter): [phone] (Mobile)  SUMMARY OF RECOMMENDATIONS   DNAR/DNI  Patient will benefit from psychiatry consultation in house for help with severe anxiety management of note has had two episodes at [facility] whereby additional supportive services were called in - One of which her daughter shares patient was present in a fetal position for > 2 hours this was when she was stopped "cold [place]" from gabapentin  Patient's daughter will bring a copy of the MOST form and advanced directives to scanned into [employer]  Best case and worst-case scenarios were reviewed  Outpatient palliative  care services were offered and patient's daughter as well as patient are interested in these  Patient is familiar with hospice services and would be open to that if it gets to that point had prior had great respect and appreciation for hospice [facility] the [facility]  Ongoing palliative care supportive services until discharge  Code Status/Advance Care Planning: DNAR/DNI   Palliative Prophylaxis:  Aspiration, Bowel Regimen, Delirium Protocol, Frequent Pain Assessment, Oral Care, Palliative Wound Care, and Turn Reposition  Additional Recommendations (Limitations, Scope, Preferences): Continue current scope of care with goals for improvement  Psycho-social/Spiritual:  Desire for further Chaplaincy support: Not presently Additional Recommendations: Education on chronic diseases and management of severe anxiety   Prognosis: Unclear though patient has multiple chronic comorbidities placing her at a high 49-monthmortality risk  Discharge Planning: Discharge to skilled nursing with outpatient palliative support once medically optimized  Vitals:   08/28/21 0440 08/28/21 0503  BP: (!) 174/60 (!) 151/58  Pulse: 68 71  Resp: 16   Temp: 98.7 F (37.1 C)   SpO2: 95%     Intake/Output Summary (Last 24 hours) at 08/28/2021 0740 Last data filed at 08/28/2021 0244 Gross per 24 hour  Intake 333.36 ml  Output 850 ml  Net -516.64 ml   Last Weight  Most recent update: 08/26/2021  7:06 AM  Weight  59 kg (130 lb)            Gen:  elderly Caucasian female in no acute distress HEENT: moist mucous membranes CV: Regular rate and rhythm  PULM: 2LPM [place], breathing even and nonlabored ABD: soft/nontender  EXT: Pedal edema Neuro: Alert and oriented x3  PPS: 30-40%   This conversation/these recommendations were discussed with patient primary care team, Dr. Maylene Roes  Total Time: 49  Billing based on MDM: High  Problems Addressed: One acute or chronic illness or injury that poses a threat to life  or bodily function  Amount and/or Complexity of Data: Category 3:Discussion of management or test interpretation with external physician/other qualified health care professional/appropriate source (not separately reported)  Risks: Decision regarding hospitalization or escalation of hospital care and Decision not to resuscitate or to de-escalate care because of poor prognosis ______________________________________________________ [facility] Team Team Cell Phone: [phone] Please utilize secure chat with additional questions, if there is no response within 30 minutes please call the above phone number  Palliative Medicine Team providers are available by phone from 7am to 7pm daily and can be reached through the team cell phone.  Should this patient require assistance outside of these hours, please call the patient's attending physician.

## 2021-08-28 NOTE — TOC Progression Note (Signed)
Transition of Care [facility]) - Progression Note    Patient Details  Name: Samantha Crane MRN: [id number] Date of Birth: 1929/03/08  Transition of [facility] CM/SW Contact  Golda Acre, RN Phone Number: 08/28/2021, 2:07 PM  Clinical Narrative:    [id number]/per Dru Stilles at [facility] -pt is a resident there and plan is for her to go to snf area when dcd. Admissions phone is [phone]   Expected Discharge Plan: Skilled Nursing Facility Barriers to Discharge: Continued Medical Work up  Expected Discharge Plan and Services Expected Discharge Plan: Skilled Nursing Facility   Discharge Planning Services: CM Consult Post Acute Care Choice: Skilled Nursing Facility Living arrangements for the past 2 months: Apartment                                       Social Determinants of Health (SDOH) Interventions    Readmission Risk Interventions    08/22/2020    8:38 AM  Readmission Risk Prevention Plan  HRI or Home Care Consult Complete  Social Work Consult for Recovery Care Planning/Counseling Complete  Palliative Care Screening Not Applicable

## 2021-08-28 NOTE — Discharge Instructions (Addendum)
Inpatient Psychiatry recommendations: Will discontinue bupropion. We will also recommend adjusting and or discontinuing mirtazapine. -Continue to increase gabapentin to therapeutic dose to target anxiety and panic. -Recommend outpatient therapist for cognitive behavioral therapy, with focus on coping skills for anxiety reduction. -Psychoeducation provided for polypharmacy and increase in risk for falls, worsening cognitive impairment, worsening anxiety and other factors related to multiple medications.  Also advised of oxycodone use and Klonopin use, can result in falls and contribute to respiratory distress.

## 2021-08-28 NOTE — Plan of Care (Signed)

## 2021-08-28 NOTE — Progress Notes (Signed)
PROGRESS NOTE    Samantha Crane  KPT:[id number] DOB: February 15, 1929 DOA: 08/26/2021 PCP: Javier Glazier, MD     Brief Narrative:  Samantha Crane is a 88 y.o. female with medical history significant of anxiety, depression, CKD, hyperlipidemia, hypertension, IBS, chronic diarrhea, PUD with hemorrhage, seasonal allergies, spinal stenosis, orthostatic tremor, vitamin D deficiency, TIA who is coming to the emergency department due to having a fall at her facility landing backwards and on her left sided chest.  She had a fall last month sustaining a rib fracture.  She has been having and UTI and has been treated with K-Flex for the past 3 days.  However, her daughter state that she has been confused for the past 5 days.  No fever to the daughter's knowledge.  She is able to answer simple questions and stated that she only has pain when moving.  No headache, chest or abdominal pain or nausea at the moment.  Urinalysis showed positive nitrates, large leukocyte esterase, more than 50 WBC and rare bacteria microscopic examination.  Lumbar, rib series x-rays, CT head and CT lumbar spine with no acute abnormality.  However, thoracic spine CT came back with a T8 vertebral compression fracture with 25% body height loss and a 3 mm bony retropulsion centrally with no evidence suggesting significant cannula stenosis at this level.  There are acute nondisplaced fractures of the left-sided transverse processes of T8, T9, T10 and T11.  There are nondisplaced posterior rib fractures at the same levels with involvement of the costovertebral junctions ribs 10 and 11.  There are subacute appearing fractures of the posterior right 10th and 11th ribs with bony callus formation.  There are chronic compression fractures of T9 and T10.  There is small left-sided and trace right-sided pleural effusions.  There is mild ascending thoracic aortic tach measuring 4.1 cm with annual imaging follow-up by CTA or MRA recommended.    Patient was  admitted for further treatment for pain management.  New events last 24 hours / Subjective: Doing well this morning.  Had some nausea after breakfast.  Patient and daughter met with palliative care medicine.  She does have DNR and MOST form in place.  Daughter states that patient has had it for almost a decade due to caring for her husband who was diagnosed with Parkinson's and ultimately passed away after long battle.  Apparently, patient's gabapentin was abruptly stopped at her ALF  leading to severe panic attacks and patient.  Daughter requesting psychiatric evaluation to manage medications as she does not have faith in ALF to manage her psych meds  Assessment & Plan:  Principal Problem:   Acute metabolic encephalopathy Active Problems:   Essential hypertension   Hyperlipidemia   Acute lower UTI   Chronic diastolic CHF (congestive heart failure) (HCC)   Depression   Multiple rib fractures   Closed fracture of multiple thoracic vertebrae (HCC)   Acute metabolic encephalopathy -Improved   E Coli UTI, present on admission -Urine culture showing >100,000 colony E. coli.  Susceptibility to follow -Rocephin  Fall with multiple rib fractures, closed fracture of multiple thoracic vertebra -Pain control -Incentive spirometry -PT OT recommending SNF placement  Hypertension -Lisinopril  Hyperlipidemia -Lipitor  Chronic diastolic heart failure -Euvolemic  Mood disorder -Bupropion, mirtazapine, sertraline, clonazepam -Psych consulted to manage meds   DVT prophylaxis:  enoxaparin (LOVENOX) injection 40 mg Start: 08/26/21 2200  Code Status: DNR Family Communication: Daughter at bedside Disposition Plan:  Status is: Inpatient Remains inpatient appropriate because: Need urine  culture susceptibility, pain control, nausea management, bowel movement on narcotics.  Psych consulted at patient's family request   Antimicrobials:  Anti-infectives (From admission, onward)    Start      Dose/Rate Route Frequency Ordered Stop   08/27/21 1200  cefTRIAXone (ROCEPHIN) 1 g in sodium chloride 0.9 % 100 mL IVPB        1 g 200 mL/hr over 30 Minutes Intravenous Every 24 hours 08/26/21 1326     08/26/21 1200  cefTRIAXone (ROCEPHIN) 1 g in sodium chloride 0.9 % 100 mL IVPB        1 g 200 mL/hr over 30 Minutes Intravenous  Once 08/26/21 1148 08/26/21 1323        Objective: Vitals:   08/27/21 2319 08/28/21 0440 08/28/21 0503 08/28/21 1250  BP: (!) 152/56 (!) 174/60 (!) 151/58 (!) 152/52  Pulse: 62 68 71 (!) 59  Resp:  16  16  Temp:  98.7 F (37.1 C)  98.4 F (36.9 C)  TempSrc:    Oral  SpO2:  95%  98%  Weight:      Height:        Intake/Output Summary (Last 24 hours) at 08/28/2021 1309 Last data filed at 08/28/2021 1142 Gross per 24 hour  Intake 573.36 ml  Output 1100 ml  Net -526.64 ml    Filed Weights   08/26/21 0702 08/26/21 0705  Weight: 59 kg 59 kg    Examination:  General exam: Appears calm and comfortable  Respiratory system: Clear to auscultation. Respiratory effort normal. No respiratory distress. No conversational dyspnea.  Cardiovascular system: S1 & S2 heard, RRR. No murmurs. No pedal edema. Gastrointestinal system: Abdomen is nondistended, soft and nontender. Normal bowel sounds heard. Central nervous system: Alert and oriented. No focal neurological deficits. Speech clear.  Extremities: Symmetric in appearance  Skin: No rashes, lesions or ulcers on exposed skin  Psychiatry: Judgement and insight appear stable   Data Reviewed: I have personally reviewed following labs and imaging studies  CBC: Recent Labs  Lab 08/26/21 0927  WBC 11.3*  NEUTROABS 8.5*  HGB 13.8  HCT 40.7  MCV 93.8  PLT 568    Basic Metabolic Panel: Recent Labs  Lab 08/26/21 0927  NA 138  K 4.0  CL 99  CO2 26  GLUCOSE 92  BUN 13  CREATININE 0.73  CALCIUM 9.6    GFR: Estimated Creatinine Clearance: 41.2 mL/min (by C-G formula based on SCr of 0.73  mg/dL). Liver Function Tests: Recent Labs  Lab 08/26/21 0927  AST 23  ALT 18  ALKPHOS 73  BILITOT 0.6  PROT 7.3  ALBUMIN 4.2    No results for input(s): "LIPASE", "AMYLASE" in the last 168 hours. No results for input(s): "AMMONIA" in the last 168 hours. Coagulation Profile: No results for input(s): "INR", "PROTIME" in the last 168 hours. Cardiac Enzymes: No results for input(s): "CKTOTAL", "CKMB", "CKMBINDEX", "TROPONINI" in the last 168 hours. BNP (last 3 results) No results for input(s): "PROBNP" in the last 8760 hours. HbA1C: No results for input(s): "HGBA1C" in the last 72 hours. CBG: No results for input(s): "GLUCAP" in the last 168 hours. Lipid Profile: No results for input(s): "CHOL", "HDL", "LDLCALC", "TRIG", "CHOLHDL", "LDLDIRECT" in the last 72 hours. Thyroid Function Tests: No results for input(s): "TSH", "T4TOTAL", "FREET4", "T3FREE", "THYROIDAB" in the last 72 hours. Anemia Panel: No results for input(s): "VITAMINB12", "FOLATE", "FERRITIN", "TIBC", "IRON", "RETICCTPCT" in the last 72 hours. Sepsis Labs: No results for input(s): "PROCALCITON", "LATICACIDVEN" in the last  168 hours.  Recent Results (from the past 240 hour(s))  Urine Culture     Status: Abnormal (Preliminary result)   Collection Time: 08/26/21  8:01 AM   Specimen: Urine, Clean Catch  Result Value Ref Range Status   Specimen Description   Final    URINE, CLEAN CATCH Performed at [facility], [place] [address], [place], [place] [address]    Special Requests   Final    NONE Performed at [facility], [place] [address] [place], [place] [address]    Culture (A)  Final    >=100,000 COLONIES/mL ESCHERICHIA COLI SUSCEPTIBILITIES TO FOLLOW Performed at [facility], [place] [address], [place], [place] [address]    Report Status PENDING  Incomplete      Radiology Studies: CT Lumbar Spine Wo Contrast  Result Date: 08/26/2021 CLINICAL DATA:  Low back pain  EXAM: CT LUMBAR SPINE WITHOUT CONTRAST TECHNIQUE: Multidetector CT imaging of the lumbar spine was performed without intravenous contrast administration. Multiplanar CT image reconstructions were also generated. RADIATION DOSE REDUCTION: This exam was performed according to the departmental dose-optimization program which includes automated exposure control, adjustment of the mA and/or kV according to patient size and/or use of iterative reconstruction technique. COMPARISON:  X-ray 08/26/2021, MRI 03/31/2019 FINDINGS: Segmentation: 5 lumbar type vertebrae. Alignment: 6 mm grade 1 anterolisthesis of L4 on L5, unchanged. Trace retrolisthesis at L5-S1. Vertebrae: Lumbar vertebral body heights are maintained without fracture. Bilateral rib fractures, as described on thoracic spine CT report. Degenerative endplate sclerosis at A3-F5. No suspicious lytic or sclerotic bone lesion. Paraspinal and other soft tissues: Left greater than right pleural effusions with associated atelectasis. Extensive aortoiliac atherosclerosis. Disc levels: Advanced degenerative disc disease of L5-S1 with high-grade disc height loss. Vacuum disc phenomenon at L4-5 without significant height loss. Moderate bilateral lower lumbar facet arthropathy. There could be mild canal stenosis at L4-5. At least mild right-sided foraminal stenosis at L5-S1. There could be mild canal stenosis on the left at L5-S1 and on the right at L4-5. IMPRESSION: 1. No acute fracture or traumatic malalignment of the lumbar spine. 2. Lower lumbar spondylosis, as described. 3. Bilateral rib fractures, as described on thoracic spine CT report. 4. Left greater than right pleural effusions with associated atelectasis. Aortic Atherosclerosis (ICD10-I70.0). Electronically Signed   By: Davina Poke D.O.   On: 08/26/2021 14:44   CT Thoracic Spine Wo Contrast  Result Date: 08/26/2021 CLINICAL DATA:  Mid-back pain upper back pain EXAM: CT THORACIC SPINE WITHOUT CONTRAST  TECHNIQUE: Multidetector CT images of the thoracic were obtained using the standard protocol without intravenous contrast. RADIATION DOSE REDUCTION: This exam was performed according to the departmental dose-optimization program which includes automated exposure control, adjustment of the mA and/or kV according to patient size and/or use of iterative reconstruction technique. COMPARISON:  X-ray 09/22/2020, MRI 04/01/2018 FINDINGS: Alignment: Normal. Vertebrae: Acute compression fracture of the T8 vertebral body with approximately 25% vertebral body height loss centrally. Fracture lines involve the superior and inferior endplates as well as the anterior and posterior walls of the vertebral body. There is 3 mm of bony retropulsion centrally. No evidence of fracture extension into the posterior elements. Acute nondisplaced fractures of the left-sided transverse processes of T8, T9, T10, and T11. Acute nondisplaced posterior rib fractures of the left eighth through eleventh ribs with involvement of the costovertebral junctions at ribs 10 and 11. Subacute appearing fracture of the posterior right tenth and eleventh ribs with bony callus formation. Chronic moderate superior  endplate compression fracture of the T10 vertebral body with mild retropulsion. Subtle chronic superior endplate compression fracture of T9. These appear unchanged from the prior without progressive height loss. Remaining thoracic vertebral body heights are maintained. No additional fractures. No lytic or sclerotic bone lesion. Degenerative disc disease is visualized at the C6-7 level in the lower cervical spine. Paraspinal and other soft tissues: Small-moderate layering left-sided pleural effusion. Trace right-sided pleural effusion. Associated mild compressive atelectasis. Aortic atherosclerosis with mid ascending thoracic aorta measuring up to 4.1 cm in diameter. Pulmonary trunk measures 3.3 cm in diameter. Disc levels: No evidence of high-grade  canal stenosis by CT. Disc heights are relatively well preserved. No significant facet arthropathy. IMPRESSION: 1. Acute compression fracture of the T8 vertebral body with approximately 25% vertebral body height loss centrally. There is 3 mm of bony retropulsion centrally. No evidence to suggest significant canal stenosis at this level. 2. Acute nondisplaced fractures of the left-sided transverse processes of T8, T9, T10, and T11. 3. Acute nondisplaced posterior rib fractures of the left eighth through eleventh ribs with involvement of the costovertebral junctions at ribs 10 and 11. 4. Subacute appearing fracture of the posterior right tenth and eleventh ribs with bony callus formation. 5. Chronic compression fractures of T9 and T10. 6. Small-moderate layering left-sided pleural effusion with trace right-sided pleural effusion. 7. Mid ascending thoracic aorta measuring up to 4.1 cm in diameter. Recommend annual imaging followup by CTA or MRA. This recommendation follows 2010 ACCF/AHA/AATS/ACR/ASA/SCA/SCAI/SIR/STS/SVM Guidelines for the Diagnosis and Management of Patients with Thoracic Aortic Disease. Circulation. 2010; 121: V400-Q676. Aortic aneurysm NOS (ICD10-I71.9) 8. Aortic and coronary artery atherosclerosis (ICD10-I70.0). Electronically Signed   By: Davina Poke D.O.   On: 08/26/2021 14:38      Scheduled Meds:  aspirin  325 mg Oral Daily   atorvastatin  40 mg Oral QHS   buPROPion  150 mg Oral BID   clonazepam  0.25 mg Oral TID   conjugated estrogens  1 Applicatorful Vaginal Q M,W,F   enoxaparin (LOVENOX) injection  40 mg Subcutaneous QHS   ferrous sulfate  325 mg Oral q morning   fluticasone  1 spray Each Nare BID   gabapentin  200 mg Oral Daily   lisinopril  20 mg Oral q morning   loratadine  10 mg Oral Daily   mirabegron ER  25 mg Oral Daily   mirtazapine  15 mg Oral QHS   pantoprazole  40 mg Oral q morning   sertraline  100 mg Oral q morning   Continuous Infusions:  sodium  chloride 10 mL/hr at 08/28/21 1121   cefTRIAXone (ROCEPHIN)  IV 1 g (08/28/21 1141)     LOS: 2 days     Dessa Phi, DO [facility] 08/28/2021, 1:09 PM   Available via Epic secure chat 7am-7pm After these hours, please refer to coverage provider listed on [email or web address]

## 2021-08-28 NOTE — TOC Progression Note (Signed)
Transition of Care [facility]) - Progression Note    Patient Details  Name: Samantha Crane MRN: [id number] Date of Birth: 01-08-1930  [facility] CM/SW Contact  Golda Acre, RN Phone Number: 08/28/2021, 10:38 AM  Clinical Narrative:    30 daY MEDICAL NECESSITY form faxed to [employer].   Expected Discharge Plan: Skilled Nursing Facility Barriers to Discharge: Continued Medical Work up  Expected Discharge Plan and Services Expected Discharge Plan: Skilled Nursing Facility   Discharge Planning Services: CM Consult Post Acute Care Choice: Skilled Nursing Facility Living arrangements for the past 2 months: Apartment                                       Social Determinants of Health (SDOH) Interventions    Readmission Risk Interventions    08/22/2020    8:38 AM  Readmission Risk Prevention Plan  HRI or Home Care Consult Complete  Social Work Consult for Recovery Care Planning/Counseling Complete  Palliative Care Screening Not Applicable

## 2021-08-28 NOTE — Consult Note (Signed)
  Samantha Crane is a 88 y.o. female with medical history significant of anxiety, depression, CKD, hyperlipidemia, hypertension, IBS, chronic diarrhea, PUD with hemorrhage, seasonal allergies, spinal stenosis, orthostatic tremor, vitamin D deficiency, TIA who is coming to the emergency department due to having a fall at her facility landing backwards and on her left sided chest.  She had a fall last month sustaining a rib fracture.  She has been having and UTI and has been treated with K-Flex for the past 3 days.  However, her daughter state that she has been confused for the past 5 days.  No fever to the daughter's knowledge.  She is able to answer simple questions and stated that she only has pain when moving.  No headache, chest or abdominal pain or nausea at the moment.   Urinalysis showed positive nitrates, large leukocyte esterase, more than 50 WBC and rare bacteria microscopic examination.   Psych consult:Please touch base with daughter Samantha Crane regarding medication adjustments. Has been an ongoing issue and medication changed at assisted living facility with poor result.   Did briefly discuss with patient and son who is at the bedside, psych consult concerns.  No new concerns were identified at this time.  Consent was then obtained to speak with daughter Samantha Crane regarding psych concerns.  Daughter reports he was at the suggestion of palliative care who recommended inpatient psych consult for medication management and review.  Informal consult with daughter Samantha Crane  for reasons listed above. There appears to be concerns regarding polypharmacy. Daughter expresses frustration surrounding lack of mental health care, abrupt discontinuation of Gabapentin, and worsening panic attacks since gabapentin has been dc'd.   We reviewed patient medications at which time include bupropion, Mirtazapine, Sertraline, klonopin, and Gabapentin. We discussed the various classes of psychotropic medications. We  agreed on discontinuing the Bupropion due to likelihood of worsening anxiety.   Treatment plan: Will discontinue bupropion. We will also recommend adjusting and or discontinuing mirtazapine. -Continue to increase gabapentin to therapeutic dose to target anxiety and panic. -Recommend outpatient therapist for cognitive behavioral therapy, with focus on coping skills for anxiety reduction. -Psychoeducation provided for polypharmacy and increase in risk for falls, worsening cognitive impairment, worsening anxiety and other factors related to multiple medications.  Advised daughter importance of identifying medications that treat multiple conditions in order to reduce polypharmacy.  Also advised of oxycodone use and Klonopin use, can result in falls and contribute to respiratory distress.    Psychiatry will sign off at this time.

## 2021-08-28 NOTE — NC FL2 (Signed)
[place] MEDICAID FL2 LEVEL OF CARE SCREENING TOOL     IDENTIFICATION  Patient Name: Samantha Crane Birthdate: Jun 11, 1929 Sex: female Admission Date (Current Location): 08/26/2021  [facility] and [place] Number:  Producer, television/film/video and Address:  [facility],  [address] [place], [place] [address]      Provider Number: [id number]  Attending Physician Name and Address:  Noralee Stain, DO  Relative Name and Phone Number:       Current Level of Care: Hospital Recommended Level of Care: Skilled Nursing Facility Prior Approval Number:    Date Approved/Denied: 08/28/21 PASRR Number: [id number]  Discharge Plan: SNF    Current Diagnoses: Patient Active Problem List   Diagnosis Date Noted   Acute metabolic encephalopathy 08/26/2021   Depression 08/26/2021   Multiple rib fractures 08/26/2021   Closed fracture of multiple thoracic vertebrae (HCC) 08/26/2021   Fracture of one rib, left side, initial encounter for closed fracture 01/23/2021   Weakness 09/22/2020   TIA (transient ischemic attack)    Frailty 09/13/2020   Acute on chronic respiratory failure with hypoxia (HCC) 08/20/2020   Chronic diastolic CHF (congestive heart failure) (HCC) 08/20/2020   Sepsis (HCC) 08/19/2020   Cellulitis of left leg 08/18/2020   COVID-19 01/13/2019   Hyponatremia 12/18/2016   Incomplete emptying of bladder 11/15/2016   Prolapse of female genital organs 10/21/2016   Acute lower UTI 10/21/2016   Vaginal atrophy 10/21/2016   Right sciatic nerve pain 07/08/2016   Chronic nonseasonal allergic rhinitis due to pollen 02/09/2016   Environmental allergies 02/09/2016   Allergic rhinitis 11/29/2015   Cough 11/29/2015   History of food allergy 11/29/2015   Primary insomnia 06/05/2015   Subacute maxillary sinusitis 01/06/2015   Chronic diarrhea 06/20/2014   Chronic kidney disease (CKD) stage G3a/A1, moderately decreased glomerular filtration rate (GFR) between 45-59 mL/min/1.73 square  meter and albuminuria creatinine ratio less than 30 mg/g (HCC) 11/30/2013   Alopecia 03/17/2013   Dermatitis 12/04/2012   B-complex deficiency 11/19/2012   Essential tremor 11/19/2012   Duodenal ulcer with hemorrhage 05/20/2012   Orthostatic tremor 02/19/2012   Carotid stenosis, bilateral 09/17/2011   Stenosis of carotid artery 09/17/2011   Hyperlipidemia 04/16/2011   Personal history of transient ischemic attack (TIA), and cerebral infarction without residual deficits 03/04/2011   Vitamin D deficiency 09/20/2010   Anxiety 08/08/2010   Essential hypertension 08/08/2010   Irritable bowel syndrome without diarrhea 08/08/2010   Major depressive disorder, single episode 08/08/2010   Spinal stenosis 08/08/2007    Orientation RESPIRATION BLADDER Height & Weight     Self, Time, Situation, Place  Normal Continent Weight: 59 kg Height:  5\' 4"  (162.6 cm)  BEHAVIORAL SYMPTOMS/MOOD NEUROLOGICAL BOWEL NUTRITION STATUS      Continent Diet (regular)  AMBULATORY STATUS COMMUNICATION OF NEEDS Skin   Extensive Assist Verbally Normal                       Personal Care Assistance Level of Assistance  Bathing, Feeding, Dressing Bathing Assistance: Independent Feeding assistance: Independent Dressing Assistance: Independent     Functional Limitations Info  Sight, Hearing, Speech Sight Info: Adequate Hearing Info: Adequate Speech Info: Adequate    SPECIAL CARE FACTORS FREQUENCY  PT (By licensed PT), OT (By licensed OT)     PT Frequency: 5 x weekly OT Frequency: 5 x weekly            Contractures Contractures Info: Not present    Additional Factors Info  Code  Status Code Status Info: full             Current Medications (08/28/2021):  This is the current hospital active medication list Current Facility-Administered Medications  Medication Dose Route Frequency Provider Last Rate Last Admin   0.9 %  sodium chloride infusion   Intravenous PRN Noralee Stain, DO 10 mL/hr  at 08/28/21 1121 New Bag at 08/28/21 1121   acetaminophen (TYLENOL) tablet 650 mg  650 mg Oral Q6H PRN Bobette Mo, MD   650 mg at 08/27/21 1638   Or   acetaminophen (TYLENOL) suppository 650 mg  650 mg Rectal Q6H PRN Bobette Mo, MD       aspirin tablet 325 mg  325 mg Oral Daily Bobette Mo, MD   325 mg at 08/28/21 1102   atorvastatin (LIPITOR) tablet 40 mg  40 mg Oral QHS Bobette Mo, MD   40 mg at 08/27/21 2214   buPROPion (WELLBUTRIN SR) 12 hr tablet 150 mg  150 mg Oral BID Bobette Mo, MD   150 mg at 08/28/21 1103   cefTRIAXone (ROCEPHIN) 1 g in sodium chloride 0.9 % 100 mL IVPB  1 g Intravenous Q24H Bobette Mo, MD 200 mL/hr at 08/28/21 1141 1 g at 08/28/21 1141   clonazepam (KLONOPIN) disintegrating tablet 0.25 mg  0.25 mg Oral TID Noralee Stain, DO   0.25 mg at 08/28/21 1102   And   clonazepam (KLONOPIN) disintegrating tablet 0.25 mg  0.25 mg Oral BID PRN Noralee Stain, DO       conjugated estrogens (PREMARIN) vaginal cream 1 Applicatorful  1 Applicatorful Vaginal Q M,W,F Bobette Mo, MD   1 Applicatorful at 08/27/21 2133   enoxaparin (LOVENOX) injection 40 mg  40 mg Subcutaneous QHS Bobette Mo, MD   40 mg at 08/27/21 2211   fentaNYL (SUBLIMAZE) injection 25 mcg  25 mcg Intravenous Q2H PRN Noralee Stain, DO   25 mcg at 08/27/21 2140   ferrous sulfate tablet 325 mg  325 mg Oral q morning Bobette Mo, MD   325 mg at 08/28/21 1103   fluticasone (FLONASE) 50 MCG/ACT nasal spray 1 spray  1 spray Each Nare BID Bobette Mo, MD   1 spray at 08/28/21 1103   gabapentin (NEURONTIN) capsule 200 mg  200 mg Oral Daily Bobette Mo, MD   200 mg at 08/28/21 1103   hydrALAZINE (APRESOLINE) tablet 25 mg  25 mg Oral Q6H PRN Bobette Mo, MD       ketorolac (TORADOL) 15 MG/ML injection 15 mg  15 mg Intravenous Q8H PRN Bobette Mo, MD   15 mg at 08/28/21 0235   lisinopril (ZESTRIL) tablet 20 mg  20 mg Oral  q morning Bobette Mo, MD   20 mg at 08/28/21 1103   loratadine (CLARITIN) tablet 10 mg  10 mg Oral Daily Bobette Mo, MD   10 mg at 08/28/21 1103   mirabegron ER (MYRBETRIQ) tablet 25 mg  25 mg Oral Daily Bobette Mo, MD   25 mg at 08/28/21 1103   mirtazapine (REMERON) tablet 15 mg  15 mg Oral QHS Bobette Mo, MD   15 mg at 08/27/21 2214   ondansetron (ZOFRAN) tablet 4 mg  4 mg Oral Q6H PRN Bobette Mo, MD       Or   ondansetron [facility]) injection 4 mg  4 mg Intravenous Q6H PRN Bobette Mo, MD   4 mg  at 08/28/21 1109   Oral care mouth rinse  15 mL Mouth Rinse PRN Reubin Milan, MD       oxyCODONE (Oxy IR/ROXICODONE) immediate release tablet 2.5 mg  2.5 mg Oral Q6H PRN Reubin Milan, MD       pantoprazole (PROTONIX) EC tablet 40 mg  40 mg Oral q morning Reubin Milan, MD   40 mg at 08/28/21 1102   sertraline (ZOLOFT) tablet 100 mg  100 mg Oral q morning Reubin Milan, MD   100 mg at 08/28/21 1103     Discharge Medications: Please see discharge summary for a list of discharge medications.  Relevant Imaging Results:  Relevant Lab Results:   Additional Information SSN: [id number]  Leeroy Cha, RN

## 2021-08-29 DIAGNOSIS — N390 Urinary tract infection, site not specified: Secondary | ICD-10-CM

## 2021-08-29 DIAGNOSIS — R52 Pain, unspecified: Secondary | ICD-10-CM | POA: Diagnosis not present

## 2021-08-29 DIAGNOSIS — I10 Essential (primary) hypertension: Secondary | ICD-10-CM

## 2021-08-29 DIAGNOSIS — S2242XA Multiple fractures of ribs, left side, initial encounter for closed fracture: Secondary | ICD-10-CM | POA: Diagnosis not present

## 2021-08-29 LAB — URINE CULTURE: Culture: >=100000 — AB

## 2021-08-29 LAB — CARBAPENEM RESISTANCE PANEL
Carba Resistance IMP Gene: NOT DETECTED
Carba Resistance KPC Gene: NOT DETECTED
Carba Resistance NDM Gene: NOT DETECTED
Carba Resistance OXA48 Gene: NOT DETECTED
Carba Resistance VIM Gene: NOT DETECTED

## 2021-08-29 MED ORDER — SENNOSIDES-DOCUSATE SODIUM 8.6-50 MG PO TABS
2.0000 | ORAL_TABLET | Freq: Every day | ORAL | Status: DC
  Administered 2021-08-29: 2 via ORAL
  Filled 2021-08-29: qty 2

## 2021-08-29 MED ORDER — FOSFOMYCIN TROMETHAMINE 3 G PO PACK
3.0000 g | PACK | Freq: Once | ORAL | Status: AC
Start: 2021-08-29 — End: 2021-08-29
  Administered 2021-08-29: 3 g via ORAL
  Filled 2021-08-29: qty 3

## 2021-08-29 MED ORDER — TRAMADOL HCL 50 MG PO TABS
50.0000 mg | ORAL_TABLET | Freq: Four times a day (QID) | ORAL | Status: DC | PRN
  Administered 2021-08-29 – 2021-08-30 (×2): 50 mg via ORAL
  Filled 2021-08-29 (×2): qty 1

## 2021-08-29 MED ORDER — ACETAMINOPHEN 325 MG PO TABS
650.0000 mg | ORAL_TABLET | Freq: Three times a day (TID) | ORAL | Status: DC
Start: 2021-08-29 — End: 2021-08-30
  Administered 2021-08-29 – 2021-08-30 (×3): 650 mg via ORAL
  Filled 2021-08-29 (×3): qty 2

## 2021-08-29 MED ORDER — GABAPENTIN 100 MG PO CAPS
200.0000 mg | ORAL_CAPSULE | Freq: Two times a day (BID) | ORAL | Status: DC
  Administered 2021-08-29 – 2021-08-30 (×2): 200 mg via ORAL
  Filled 2021-08-29 (×2): qty 2

## 2021-08-29 MED ORDER — LIDOCAINE 5 % EX PTCH
1.0000 | MEDICATED_PATCH | CUTANEOUS | Status: DC
  Administered 2021-08-29 – 2021-08-30 (×2): 1 via TRANSDERMAL
  Filled 2021-08-29 (×2): qty 1

## 2021-08-29 MED ORDER — POLYETHYLENE GLYCOL 3350 17 G PO PACK
17.0000 g | PACK | Freq: Two times a day (BID) | ORAL | Status: DC
  Administered 2021-08-29 – 2021-08-30 (×3): 17 g via ORAL
  Filled 2021-08-29 (×3): qty 1

## 2021-08-29 MED ORDER — MIRTAZAPINE 15 MG PO TABS
7.5000 mg | ORAL_TABLET | Freq: Every day | ORAL | Status: DC
  Administered 2021-08-29: 7.5 mg via ORAL
  Filled 2021-08-29: qty 1

## 2021-08-29 NOTE — TOC Progression Note (Signed)
Transition of Care [facility]) - Progression Note    Patient Details  Name: Wadie Mattie MRN: [id number] Date of Birth: 11-05-1929  [facility] CM/SW Contact  Golda Acre, RN Phone Number: 08/29/2021, 12:31 PM  Clinical Narrative:    Clovis Cao sent to [facility]   Expected Discharge Plan: Skilled Nursing Facility Barriers to Discharge: Continued Medical Work up  Expected Discharge Plan and Services Expected Discharge Plan: Skilled Nursing Facility   Discharge Planning Services: CM Consult Post Acute Care Choice: Skilled Nursing Facility Living arrangements for the past 2 months: Apartment                                       Social Determinants of Health (SDOH) Interventions    Readmission Risk Interventions    08/22/2020    8:38 AM  Readmission Risk Prevention Plan  HRI or Home Care Consult Complete  Social Work Consult for Recovery Care Planning/Counseling Complete  Palliative Care Screening Not Applicable

## 2021-08-29 NOTE — Progress Notes (Signed)
[facility] PROGRESS NOTE   Samantha Crane QIH:[id number] DOB: 07/08/1929 DOA: 08/26/2021  PCP: Nadara Eaton, MD  Brief History/Interval Summary: 88 y.o. female with medical history significant of anxiety, depression, CKD, hyperlipidemia, hypertension, IBS, chronic diarrhea, PUD with hemorrhage, seasonal allergies, spinal stenosis, orthostatic tremor, vitamin D deficiency, TIA who is presented to the emergency department after sustaining a fall at her assisted living facility.  She was found to have urinary tract infection, rib fracture and vertebral fractures.  She was hospitalized for further management due to significant pain issues.    Consultants: Palliative care, psychiatry  Procedures: None    Subjective/Interval History: Patient complaining of pain in her left side as well as back.  Does not want to take oxycodone.  Will be prescribed tramadol.  No other complaints offered.    Assessment/Plan:  Acute metabolic encephalopathy Multifactorial including secondary to UTI.  Seems to have improved and back to baseline.  E. coli UTI, present on admission Started on ceftriaxone.  Susceptibilities are pending.  Fall with multiple rib fractures and thoracic vertebrae fractures CT scan showed T8 vertebral compression fracture with 25% body height loss.  There was also fractures of the transverse processes of T8, T9-T10 and T11.  Nondisplaced posterior fracture of the same levels also noted.  Chronic fractures also seen in the 10th and 11th ribs posteriorly as well as in T9 and T10. No focal neurological deficits on examination.  Pain seems to be poorly controlled.  She is reluctant to take oxycodone.  We will place her on tramadol.  Bowel regimen will be prescribed. PT and OT evaluation.  Needs skilled nursing facility.  I think pain needs to be better controlled.  We will place her on scheduled acetaminophen as well.  Essential hypertension Elevated blood pressure likely  due to pain issues as well.  She is noted to be on lisinopril.  Mood disorder Seen by psychiatry.  She is noted to be on clonazepam, gabapentin, Remeron, sertraline. She was on bupropion which has been discontinued by psychiatry.  They recommend increasing gabapentin to therapeutic dose to target anxiety and panic.  They also recommended adjusting the dose of mirtazapine.  We will cut it down to 7.5 mg at bedtime.  Hyperlipidemia Continue statin.  Chronic diastolic CHF Seems to be euvolemic.   DVT Prophylaxis: Continue Lovenox Code Status: DNR Family Communication: No family at bedside Disposition Plan: SNF is recommended  Status is: Inpatient Remains inpatient appropriate because: Poorly controlled pain      Medications: Scheduled:  aspirin  325 mg Oral Daily   atorvastatin  40 mg Oral QHS   clonazepam  0.25 mg Oral TID   conjugated estrogens  1 Applicatorful Vaginal Q M,W,F   enoxaparin (LOVENOX) injection  40 mg Subcutaneous QHS   ferrous sulfate  325 mg Oral q morning   fluticasone  1 spray Each Nare BID   gabapentin  200 mg Oral Daily   lisinopril  20 mg Oral q morning   loratadine  10 mg Oral Daily   mirabegron ER  25 mg Oral Daily   mirtazapine  15 mg Oral QHS   pantoprazole  40 mg Oral q morning   polyethylene glycol  17 g Oral BID   senna-docusate  2 tablet Oral QHS   sertraline  100 mg Oral q morning   Continuous:  sodium chloride 10 mL/hr at 08/28/21 1121   cefTRIAXone (ROCEPHIN)  IV 1 g (08/28/21 1141)   OVF:IEPPIR chloride, acetaminophen **OR** acetaminophen,  clonazepam **AND** clonazepam, hydrALAZINE, ondansetron **OR** ondansetron (ZOFRAN) IV, mouth rinse, traMADol  Antibiotics: Anti-infectives (From admission, onward)    Start     Dose/Rate Route Frequency Ordered Stop   08/27/21 1200  cefTRIAXone (ROCEPHIN) 1 g in sodium chloride 0.9 % 100 mL IVPB        1 g 200 mL/hr over 30 Minutes Intravenous Every 24 hours 08/26/21 1326     08/26/21 1200   cefTRIAXone (ROCEPHIN) 1 g in sodium chloride 0.9 % 100 mL IVPB        1 g 200 mL/hr over 30 Minutes Intravenous  Once 08/26/21 1148 08/26/21 1323       Objective:  Vital Signs  Vitals:   08/28/21 2344 08/29/21 0611 08/29/21 0635 08/29/21 0903  BP: (!) 140/57 (!) 173/56 (!) 164/61 (!) 168/74  Pulse:  (!) 59 (!) 55   Resp:  15    Temp:  97.6 F (36.4 C)    TempSrc:  Oral    SpO2:  97%    Weight:      Height:        Intake/Output Summary (Last 24 hours) at 08/29/2021 1035 Last data filed at 08/29/2021 0700 Gross per 24 hour  Intake 1007.98 ml  Output 800 ml  Net 207.98 ml   Filed Weights   08/26/21 0702 08/26/21 0705  Weight: 59 kg 59 kg    General appearance: Awake alert.  In no distress Resp: Normal effort at rest.  Diminished air entry at the bases.  No wheezing or rhonchi.  No definite crackles. Cardio: S1-S2 is normal regular.  No S3-S4.  No rubs murmurs or bruit GI: Abdomen is soft.  Nontender nondistended.  Bowel sounds are present normal.  No masses organomegaly Extremities: No edema.  Full range of motion of lower extremities. Neurologic: No focal neurological deficits.    Lab Results:  Data Reviewed: I have personally reviewed following labs and reports of the imaging studies  CBC: Recent Labs  Lab 08/26/21 0927  WBC 11.3*  NEUTROABS 8.5*  HGB 13.8  HCT 40.7  MCV 93.8  PLT 241    Basic Metabolic Panel: Recent Labs  Lab 08/26/21 0927  NA 138  K 4.0  CL 99  CO2 26  GLUCOSE 92  BUN 13  CREATININE 0.73  CALCIUM 9.6    GFR: Estimated Creatinine Clearance: 41.2 mL/min (by C-G formula based on SCr of 0.73 mg/dL).  Liver Function Tests: Recent Labs  Lab 08/26/21 0927  AST 23  ALT 18  ALKPHOS 73  BILITOT 0.6  PROT 7.3  ALBUMIN 4.2     Thyroid Function Tests: Recent Labs    08/28/21 1624  TSH 0.987     Recent Results (from the past 240 hour(s))  Urine Culture     Status: Abnormal (Preliminary result)   Collection Time:  08/26/21  8:01 AM   Specimen: Urine, Clean Catch  Result Value Ref Range Status   Specimen Description   Final    URINE, CLEAN CATCH Performed at [facility], [address], [place], [place] [address]    Special Requests   Final    NONE Performed at [facility], [address] [place], [place] [address]    Culture (A)  Final    >=100,000 COLONIES/mL ESCHERICHIA COLI SUSCEPTIBILITIES TO FOLLOW Performed at [facility], [address], [place], [place] [address]    Report Status PENDING  Incomplete      Radiology Studies:  No results found.     LOS: 3 days   Tryniti Laatsch [employer] on [email or web address]  08/29/2021, 10:35 AM

## 2021-08-29 NOTE — Progress Notes (Signed)
SATURATION QUALIFICATIONS: (This note is used to comply with regulatory documentation for home oxygen)  Patient Saturations on Room Air at Rest = 92%  Patient Saturations on Room Air while Ambulating = 86%  Patient Saturations on 2 Liters of oxygen at rest (pt not able to ambulate) = 95%  Please briefly explain why patient needs home oxygen: to maintain appropriate SaO2 levels.   Ralene Bathe Kistler PT 08/29/2021  Acute Rehabilitation Services  Office [phone]

## 2021-08-29 NOTE — Progress Notes (Signed)
Notified on call provider about patient's BP being 164/61 this morning and heart rate is 55. Patient was complaining of pain. On call provider instructed RN to give PRN pain meds, and to monitor the patient's BP before giving PRN hydralazine.

## 2021-08-29 NOTE — Progress Notes (Signed)
Physical Therapy Treatment Patient Details Name: Samantha Crane MRN: [id number] DOB: [birth year] Today's Date: 08/29/2021   History of Present Illness Patient is a 88 year old female who presented to the hosptial after a fall at ALF resulting in L side chest pain. patient was found to have " thoracic spine CT came back with a T8 vertebral compression fracture with 25% body height loss and a 3 mm bony retropulsion centrally with no evidence suggesting significant cannula stenosis at this level.  There are acute nondisplaced fractures of the left-sided transverse processes of T8, T9, T10 and T11.  There are nondisplaced posterior rib fractures at the same levels with involvement of the costovertebral junctions ribs 10 and 11.  There are subacute appearing fractures of the posterior right 10th and 11th ribs with bony callus formation.  There are chronic compression fractures of T9 and T10.". WUJ:WJXBJYN, depression, IBS, HTN, spinal stenosis    PT Comments    Max assist for log roll and sidelying to sit, back and rib pain limiting factors. Min assist to take a few pivotal steps to recliner. SaO2 86% on room air with activity, 92% on room air at rest. Pt unable to tolerate ambulation 2* pain and fatigue. Performed seated BUE/LE exercises. Encouraged pt to used bedside commode rather than purewick to facilitate activity and minimize further deconditioning. Son present for session.     Recommendations for follow up therapy are one component of a multi-disciplinary discharge planning process, led by the attending physician.  Recommendations may be updated based on patient status, additional functional criteria and insurance authorization.  Follow Up Recommendations  Skilled nursing-short term rehab (<3 hours/day) Can patient physically be transported by private vehicle: No   Assistance Recommended at Discharge Frequent or constant Supervision/Assistance  Patient can return home with the following Assist for  transportation;Assistance with cooking/housework;Help with stairs or ramp for entrance;A lot of help with walking and/or transfers;A lot of help with bathing/dressing/bathroom   Equipment Recommendations  None recommended by PT    Recommendations for Other Services       Precautions / Restrictions Precautions Precautions: Fall;Back Precaution Comments: back precautions, monitor O2 Restrictions Weight Bearing Restrictions: No     Mobility  Bed Mobility Overal bed mobility: Needs Assistance Bed Mobility: Rolling, Sidelying to Sit Rolling: Max assist Sidelying to sit: Max assist       General bed mobility comments: assist to roll and to raise trunk, VCs for log roll technique    Transfers Overall transfer level: Needs assistance Equipment used: Rolling walker (2 wheels) Transfers: Sit to/from Stand, Bed to chair/wheelchair/BSC Sit to Stand: Mod assist, From elevated surface   Step pivot transfers: Min assist       General transfer comment: Assist to rise, steady, control descent. Cues for safety, technique, hand placement, flexed (neck and trunk) posture in sitting and standing, VCs to lift head, pt reports pain with attempt to bring trunk to neutral    Ambulation/Gait               General Gait Details: deferred, pt fatigued with stand pivot transfer   Stairs             Wheelchair Mobility    Modified Rankin (Stroke Patients Only)       Balance Overall balance assessment: Needs assistance, History of Falls Sitting-balance support: Feet supported, No upper extremity supported Sitting balance-Leahy Scale: Fair     Standing balance support: Bilateral upper extremity supported Standing balance-Leahy Scale: Poor  Cognition Arousal/Alertness: Awake/alert Behavior During Therapy: Anxious Overall Cognitive Status: Impaired/Different from baseline                                 General  Comments: mostly WFL. she intermittently drifts off-likely due to meds. anxiety and tremors        Exercises General Exercises - Upper Extremity Shoulder Flexion: AAROM, Both, 10 reps, Seated General Exercises - Lower Extremity Ankle Circles/Pumps: AROM, Both, 15 reps, Supine Long Arc Quad: AROM, Both, 10 reps, Seated Hip Flexion/Marching: AROM, Both, 10 reps, Seated    General Comments General comments (skin integrity, edema, etc.): SaO2 86% on room air with activity, 92% at rest on room air, 95% on 2L at rest      Pertinent Vitals/Pain Pain Assessment Faces Pain Scale: Hurts whole lot Pain Location: back with movement Pain Descriptors / Indicators: Discomfort, Sore Pain Intervention(s): Limited activity within patient's tolerance, Monitored during session, Premedicated before session    Home Living                          Prior Function            PT Goals (current goals can now be found in the care plan section) Acute Rehab PT Goals Patient Stated Goal: less pain. regain PLOF/independence PT Goal Formulation: With patient Time For Goal Achievement: 09/10/21 Potential to Achieve Goals: Good Progress towards PT goals: Progressing toward goals    Frequency    Min 3X/week      PT Plan Current plan remains appropriate    Co-evaluation              AM-PAC PT "6 Clicks" Mobility   Outcome Measure  Help needed turning from your back to your side while in a flat bed without using bedrails?: A Lot Help needed moving from lying on your back to sitting on the side of a flat bed without using bedrails?: A Lot Help needed moving to and from a bed to a chair (including a wheelchair)?: A Lot Help needed standing up from a chair using your arms (e.g., wheelchair or bedside chair)?: A Lot Help needed to walk in hospital room?: Total Help needed climbing 3-5 steps with a railing? : Total 6 Click Score: 10    End of Session Equipment Utilized During  Treatment: Gait belt;Oxygen Activity Tolerance: Patient limited by fatigue;Patient limited by pain Patient left: in chair;with call bell/phone within reach;with family/visitor present;with chair alarm set Nurse Communication: Mobility status PT Visit Diagnosis: Unsteadiness on feet (R26.81);Difficulty in walking, not elsewhere classified (R26.2);Pain;History of falling (Z91.81)     Time: 9892-1194 PT Time Calculation (min) (ACUTE ONLY): 34 min  Charges:  $Therapeutic Exercise: 8-22 mins $Therapeutic Activity: 8-22 mins                    Ralene Bathe Kistler PT 08/29/2021  Acute Rehabilitation Services  Office [phone]

## 2021-08-29 NOTE — Plan of Care (Signed)

## 2021-08-30 DIAGNOSIS — N390 Urinary tract infection, site not specified: Secondary | ICD-10-CM | POA: Diagnosis not present

## 2021-08-30 LAB — CBC
HCT: 39.3 % (ref 36.0–46.0)
Hemoglobin: 12.7 g/dL (ref 12.0–15.0)
MCH: 32 pg (ref 26.0–34.0)
MCHC: 32.3 g/dL (ref 30.0–36.0)
MCV: 99 fL (ref 80.0–100.0)
Platelets: 173 10*3/uL (ref 150–400)
RBC: 3.97 MIL/uL (ref 3.87–5.11)
RDW: 13.3 % (ref 11.5–15.5)
WBC: 7.1 10*3/uL (ref 4.0–10.5)
nRBC: 0 % (ref 0.0–0.2)

## 2021-08-30 LAB — COMPREHENSIVE METABOLIC PANEL
ALT: 25 U/L (ref 0–44)
AST: 29 U/L (ref 15–41)
Albumin: 3.1 g/dL — ABNORMAL LOW (ref 3.5–5.0)
Alkaline Phosphatase: 78 U/L (ref 38–126)
Anion gap: 6 (ref 5–15)
BUN: 15 mg/dL (ref 8–23)
CO2: 30 mmol/L (ref 22–32)
Calcium: 8.3 mg/dL — ABNORMAL LOW (ref 8.9–10.3)
Chloride: 101 mmol/L (ref 98–111)
Creatinine, Ser: 0.76 mg/dL (ref 0.44–1.00)
GFR, Estimated: >60 mL/min (ref >60)
Glucose, Bld: 97 mg/dL (ref 70–99)
Potassium: 4.3 mmol/L (ref 3.5–5.1)
Sodium: 137 mmol/L (ref 135–145)
Total Bilirubin: 0.4 mg/dL (ref 0.3–1.2)
Total Protein: 6.3 g/dL — ABNORMAL LOW (ref 6.5–8.1)

## 2021-08-30 MED ORDER — CLONAZEPAM 0.25 MG PO TBDP: ORAL_TABLET | ORAL | 0 refills | Status: DC

## 2021-08-30 MED ORDER — GABAPENTIN 100 MG PO CAPS: ORAL_CAPSULE | ORAL | 0 refills | Status: DC

## 2021-08-30 MED ORDER — ACETAMINOPHEN 325 MG PO TABS: 650.0000 mg | ORAL_TABLET | Freq: Three times a day (TID) | ORAL | 0 refills | Status: AC

## 2021-08-30 MED ORDER — LIDOCAINE 5 % EX PTCH: 1.0000 | MEDICATED_PATCH | CUTANEOUS | 0 refills | Status: AC

## 2021-08-30 MED ORDER — HYDROMORPHONE HCL 1 MG/ML IJ SOLN: 0.5000 mg | Freq: Once | INTRAMUSCULAR | Status: DC

## 2021-08-30 MED ORDER — FOSFOMYCIN TROMETHAMINE 3 G PO PACK: 3.0000 g | PACK | Freq: Once | ORAL | 0 refills | Status: AC

## 2021-08-30 MED ORDER — TRAMADOL HCL 50 MG PO TABS: 50.0000 mg | ORAL_TABLET | Freq: Four times a day (QID) | ORAL | 0 refills | Status: DC | PRN

## 2021-08-30 MED ORDER — MIRTAZAPINE 7.5 MG PO TABS: 7.5000 mg | ORAL_TABLET | Freq: Every day | ORAL | 0 refills | Status: DC

## 2021-08-30 MED ORDER — ACETAMINOPHEN 325 MG PO TABS: 650.0000 mg | ORAL_TABLET | Freq: Four times a day (QID) | ORAL | Status: DC | PRN

## 2021-08-30 MED ORDER — SENNOSIDES-DOCUSATE SODIUM 8.6-50 MG PO TABS: 2.0000 | ORAL_TABLET | Freq: Every day | ORAL | Status: DC

## 2021-08-30 MED ORDER — POLYETHYLENE GLYCOL 3350 17 G PO PACK: 17.0000 g | PACK | Freq: Two times a day (BID) | ORAL | 0 refills | Status: DC

## 2021-08-30 NOTE — Care Management Important Message (Signed)
Important Message  Patient Details IM Letter placed in Patients room. Name: Samantha Crane MRN: [id number] Date of Birth: 1929-08-10   Medicare Important Message Given:  Yes     Caren Macadam 08/30/2021, 1:00 PM

## 2021-08-30 NOTE — TOC Progression Note (Addendum)
Transition of Care [facility]) - Progression Note    Patient Details  Name: Samantha Crane MRN: [id number] Date of Birth: [birth year]  Transition of Care [facility]) CM/SW Contact  Golda Acre, RN Phone Number: 08/30/2021, 1:22 PM  Clinical Narrative:    Tct-Dru in the admission dept at [facility] message left to please return call. Tct kathy-[facility]-pt is expected may call ptar.  Expected Discharge Plan: Skilled Nursing Facility Barriers to Discharge: Continued Medical Work up  Expected Discharge Plan and Services Expected Discharge Plan: Skilled Nursing Facility   Discharge Planning Services: CM Consult Post Acute Care Choice: Skilled Nursing Facility Living arrangements for the past 2 months: Apartment Expected Discharge Date: 08/30/21                                     Social Determinants of Health (SDOH) Interventions    Readmission Risk Interventions    08/22/2020    8:38 AM  Readmission Risk Prevention Plan  HRI or Home Care Consult Complete  Social Work Consult for Recovery Care Planning/Counseling Complete  Palliative Care Screening Not Applicable

## 2021-08-30 NOTE — Progress Notes (Signed)
Called patient report to [employer] spoke with [employer]. All questions/concerns addressed. Provided her with my direct callback # should she have any further questions.   IV removed prior to d/c Transport-PTAR

## 2021-08-30 NOTE — TOC Transition Note (Signed)
Transition of Care [facility]) - CM/SW Discharge Note   Patient Details  Name: Samantha Crane MRN: [id number] Date of Birth: 11-11-1929  Transition of Care [facility]) CM/SW Contact:  Golda Acre, RN Phone Number: 08/30/2021, 1:40 PM   Clinical Narrative:    [facility] called to transport patient back to [facility] at 1340. Attempted to call the Corin[facility] at number given -no answer. Transport packet given to the unit clerk..     Barriers to Discharge: Continued Medical Work up   Patient Goals and CMS Choice Patient states their goals for this hospitalization and ongoing recovery are:: to get better CMS Medicare.gov Compare Post Acute Care list provided to:: Patient Choice offered to / list presented to : Patient  Discharge Placement                       Discharge Plan and Services   Discharge Planning Services: CM Consult Post Acute Care Choice: Skilled Nursing Facility                               Social Determinants of Health (SDOH) Interventions     Readmission Risk Interventions    08/22/2020    8:38 AM  Readmission Risk Prevention Plan  HRI or Home Care Consult Complete  Social Work Consult for Recovery Care Planning/Counseling Complete  Palliative Care Screening Not Applicable

## 2021-08-30 NOTE — Discharge Summary (Signed)
[facility]  Physician Discharge Summary   Patient ID: Samantha Crane MRN: [id number] DOB/AGE: 07/27/1929 88 y.o.  Admit date: 08/26/2021 Discharge date:   08/30/2021   PCP: Nadara Eaton, MD  DISCHARGE DIAGNOSES:  Principal Problem:   Acute metabolic encephalopathy Active Problems:   Essential hypertension   Hyperlipidemia   Acute lower UTI   Chronic diastolic CHF (congestive heart failure) (HCC)   Depression   Multiple rib fractures   Closed fracture of multiple thoracic vertebrae (HCC)   RECOMMENDATIONS FOR OUTPATIENT FOLLOW UP: Encourage incentive spirometry   Home Health: Going to SNF Equipment/Devices: None  CODE STATUS: DNR  DISCHARGE CONDITION: fair  Diet recommendation: Regular as tolerated.  INITIAL HISTORY: 88 y.o. female with medical history significant of anxiety, depression, CKD, hyperlipidemia, hypertension, IBS, chronic diarrhea, PUD with hemorrhage, seasonal allergies, spinal stenosis, orthostatic tremor, vitamin D deficiency, TIA who is presented to the emergency department after sustaining a fall at her assisted living facility.  She was found to have urinary tract infection, rib fracture and vertebral fractures.  She was hospitalized for further management due to significant pain issues.     Consultants: Palliative care, psychiatry   Procedures: None    HOSPITAL COURSE:   Acute metabolic encephalopathy Multifactorial including secondary to UTI.  Seems to have improved and back to baseline.   E. coli UTI, present on admission Noted to have multidrug-resistant E. coli.  After discussions with ID patient was given a dose of fosfomycin yesterday.  We will recommend that she get another dose of fosfomycin in 3 days, on 09/02/2021.   Fall with multiple rib fractures and thoracic vertebrae fractures CT scan showed T8 vertebral compression fracture with 25% body height loss.  There was also fractures of the transverse processes of T8,  T9-T10 and T11.  Nondisplaced posterior fracture of the same levels also noted.  Chronic fractures also seen in the 10th and 11th ribs posteriorly as well as in T9 and T10. No focal neurological deficits on examination.  Pain seems to be poorly controlled.  She is reluctant to take oxycodone.  We will place her on tramadol.  Bowel regimen will be prescribed. PT and OT evaluation.  Needs skilled nursing facility.  Pain has been reasonably well controlled although this morning she is complaining of more pain.  Last time she took her tramadol was yesterday morning.  She has a reluctance to take medications.  She is on scheduled Tylenol.  We will give her a dose of Dilaudid and encourage use of tramadol as needed for pain control. Continue with incentive spirometry.  Essential hypertension Elevated blood pressure likely due to pain issues as well.  She is noted to be on lisinopril.   Mood disorder Seen by psychiatry.  She is noted to be on clonazepam, gabapentin, Remeron, sertraline. She was on bupropion which has been discontinued by psychiatry.   They recommend increasing gabapentin to therapeutic dose to target anxiety and panic.  They also recommended adjusting the dose of mirtazapine.  We will cut it down to 7.5 mg at bedtime. Clonazepam will be gradually titrated off to as needed.   Hyperlipidemia Continue statin.   Chronic diastolic CHF Seems to be euvolemic.   Patient is stable.  Okay for discharge to SNF later today once pain is better controlled.   PERTINENT LABS:  The results of significant diagnostics from this hospitalization (including imaging, microbiology, ancillary and laboratory) are listed below for reference.    Microbiology: Recent Results (from the past  240 hour(s))  Urine Culture     Status: Abnormal   Collection Time: 08/26/21  8:01 AM   Specimen: Urine, Clean Catch  Result Value Ref Range Status   Specimen Description   Final    URINE, CLEAN CATCH Performed at  [facility], [address], [place], [place] [address]    Special Requests   Final    NONE Performed at [facility], [address] [place], [place] [address]    Culture (A)  Final    >=100,000 COLONIES/mL ESCHERICHIA COLI MULTI-DRUG RESISTANT ORGANISM CRITICAL RESULT CALLED TO, READ BACK BY AND VERIFIED WITH: PHARMD M. BELL [id number] FCP Performed at [facility], [address] [place], [place] [address]    Report Status 08/29/2021 FINAL  Final   Organism ID, Bacteria ESCHERICHIA COLI (A)  Final      Susceptibility   Escherichia coli - MIC*    AMPICILLIN >=32 RESISTANT Resistant     CEFAZOLIN >=64 RESISTANT Resistant     CEFEPIME 2 SENSITIVE Sensitive     CEFTRIAXONE >=64 RESISTANT Resistant     CIPROFLOXACIN 1 RESISTANT Resistant     GENTAMICIN <=1 SENSITIVE Sensitive     IMIPENEM RESISTANT Resistant     NITROFURANTOIN <=16 SENSITIVE Sensitive     TRIMETH/SULFA <=20 SENSITIVE Sensitive     AMPICILLIN/SULBACTAM >=32 RESISTANT Resistant     PIP/TAZO >=128 RESISTANT Resistant     * >=100,000 COLONIES/mL ESCHERICHIA COLI  Carbapenem Resistance Panel     Status: None   Collection Time: 08/26/21  8:01 AM  Result Value Ref Range Status   Carba Resistance IMP Gene NOT DETECTED NOT DETECTED Final   Carba Resistance VIM Gene NOT DETECTED NOT DETECTED Final   Carba Resistance NDM Gene NOT DETECTED NOT DETECTED Final   Carba Resistance KPC Gene NOT DETECTED NOT DETECTED Final   Carba Resistance OXA48 Gene NOT DETECTED NOT DETECTED Final    Comment: (NOTE) Cepheid Carba-R is an FDA-cleared nucleic acid amplification test  (NAAT)for the detection and differentiation of genes encoding the  most prevalent carbapenemases in bacterial isolate samples. Carbapenemase gene identification and implementation of comprehensive  infection control measures are recommended by the CDC to prevent the  spread of the resistant organisms. Performed at [facility], [address], [place], [place] [address]      Labs:   Basic Metabolic Panel: Recent Labs  Lab 08/26/21 0927 08/30/21 0400  NA 138 137  K 4.0 4.3  CL 99 101  CO2 26 30  GLUCOSE 92 97  BUN 13 15  CREATININE 0.73 0.76  CALCIUM 9.6 8.3*   Liver Function Tests: Recent Labs  Lab 08/26/21 0927 08/30/21 0400  AST 23 29  ALT 18 25  ALKPHOS 73 78  BILITOT 0.6 0.4  PROT 7.3 6.3*  ALBUMIN 4.2 3.1*    CBC: Recent Labs  Lab 08/26/21 0927 08/30/21 0400  WBC 11.3* 7.1  NEUTROABS 8.5*  --   HGB 13.8 12.7  HCT 40.7 39.3  MCV 93.8 99.0  PLT 241 173      IMAGING STUDIES CT Lumbar Spine Wo Contrast  Result Date: 08/26/2021 CLINICAL DATA:  Low back pain EXAM: CT LUMBAR SPINE WITHOUT CONTRAST TECHNIQUE: Multidetector CT imaging of the lumbar spine was performed without intravenous contrast administration. Multiplanar CT image reconstructions were also generated. RADIATION DOSE REDUCTION: This exam was performed according to the departmental dose-optimization program which includes automated exposure control, adjustment of the  mA and/or kV according to patient size and/or use of iterative reconstruction technique. COMPARISON:  X-ray 08/26/2021, MRI 03/31/2019 FINDINGS: Segmentation: 5 lumbar type vertebrae. Alignment: 6 mm grade 1 anterolisthesis of L4 on L5, unchanged. Trace retrolisthesis at L5-S1. Vertebrae: Lumbar vertebral body heights are maintained without fracture. Bilateral rib fractures, as described on thoracic spine CT report. Degenerative endplate sclerosis at 075-GRM. No suspicious lytic or sclerotic bone lesion. Paraspinal and other soft tissues: Left greater than right pleural effusions with associated atelectasis. Extensive aortoiliac atherosclerosis. Disc levels: Advanced degenerative disc disease of L5-S1 with high-grade disc height loss. Vacuum disc phenomenon at L4-5 without significant height loss. Moderate bilateral lower lumbar facet arthropathy.  There could be mild canal stenosis at L4-5. At least mild right-sided foraminal stenosis at L5-S1. There could be mild canal stenosis on the left at L5-S1 and on the right at L4-5. IMPRESSION: 1. No acute fracture or traumatic malalignment of the lumbar spine. 2. Lower lumbar spondylosis, as described. 3. Bilateral rib fractures, as described on thoracic spine CT report. 4. Left greater than right pleural effusions with associated atelectasis. Aortic Atherosclerosis (ICD10-I70.0). Electronically Signed   By: Davina Poke D.O.   On: 08/26/2021 14:44   CT Thoracic Spine Wo Contrast  Result Date: 08/26/2021 CLINICAL DATA:  Mid-back pain upper back pain EXAM: CT THORACIC SPINE WITHOUT CONTRAST TECHNIQUE: Multidetector CT images of the thoracic were obtained using the standard protocol without intravenous contrast. RADIATION DOSE REDUCTION: This exam was performed according to the departmental dose-optimization program which includes automated exposure control, adjustment of the mA and/or kV according to patient size and/or use of iterative reconstruction technique. COMPARISON:  X-ray 09/22/2020, MRI 04/01/2018 FINDINGS: Alignment: Normal. Vertebrae: Acute compression fracture of the T8 vertebral body with approximately 25% vertebral body height loss centrally. Fracture lines involve the superior and inferior endplates as well as the anterior and posterior walls of the vertebral body. There is 3 mm of bony retropulsion centrally. No evidence of fracture extension into the posterior elements. Acute nondisplaced fractures of the left-sided transverse processes of T8, T9, T10, and T11. Acute nondisplaced posterior rib fractures of the left eighth through eleventh ribs with involvement of the costovertebral junctions at ribs 10 and 11. Subacute appearing fracture of the posterior right tenth and eleventh ribs with bony callus formation. Chronic moderate superior endplate compression fracture of the T10 vertebral body  with mild retropulsion. Subtle chronic superior endplate compression fracture of T9. These appear unchanged from the prior without progressive height loss. Remaining thoracic vertebral body heights are maintained. No additional fractures. No lytic or sclerotic bone lesion. Degenerative disc disease is visualized at the C6-7 level in the lower cervical spine. Paraspinal and other soft tissues: Small-moderate layering left-sided pleural effusion. Trace right-sided pleural effusion. Associated mild compressive atelectasis. Aortic atherosclerosis with mid ascending thoracic aorta measuring up to 4.1 cm in diameter. Pulmonary trunk measures 3.3 cm in diameter. Disc levels: No evidence of high-grade canal stenosis by CT. Disc heights are relatively well preserved. No significant facet arthropathy. IMPRESSION: 1. Acute compression fracture of the T8 vertebral body with approximately 25% vertebral body height loss centrally. There is 3 mm of bony retropulsion centrally. No evidence to suggest significant canal stenosis at this level. 2. Acute nondisplaced fractures of the left-sided transverse processes of T8, T9, T10, and T11. 3. Acute nondisplaced posterior rib fractures of the left eighth through eleventh ribs with involvement of the costovertebral junctions at ribs 10 and 11. 4. Subacute appearing fracture of the posterior  right tenth and eleventh ribs with bony callus formation. 5. Chronic compression fractures of T9 and T10. 6. Small-moderate layering left-sided pleural effusion with trace right-sided pleural effusion. 7. Mid ascending thoracic aorta measuring up to 4.1 cm in diameter. Recommend annual imaging followup by CTA or MRA. This recommendation follows 2010 ACCF/AHA/AATS/ACR/ASA/SCA/SCAI/SIR/STS/SVM Guidelines for the Diagnosis and Management of Patients with Thoracic Aortic Disease. Circulation. 2010; [id number]:[id number]. Aortic aneurysm NOS (ICD10-I71.9) 8. Aortic and coronary artery atherosclerosis  (ICD10-I70.0). Electronically Signed   By: Davina Poke D.O.   On: 08/26/2021 14:38   DG Ribs Bilateral W/Chest  Result Date: 08/26/2021 CLINICAL DATA:  Fall yesterday.  Pain. EXAM: BILATERAL RIBS AND CHEST - 4+ VIEW COMPARISON:  Chest x-ray December 22, 2020 FINDINGS: The heart, hila, and mediastinum are unremarkable. No pneumothorax. Mild atelectasis in the left base. No rib fractures identified. IMPRESSION: No rib fractures identified. No pneumothorax. No acute abnormality. Electronically Signed   By: Dorise Bullion III M.D.   On: 08/26/2021 09:23   DG Lumbar Spine Complete  Result Date: 08/26/2021 CLINICAL DATA:  back pain after fall EXAM: LUMBAR SPINE - COMPLETE 4+ VIEW COMPARISON:  Correlation is made with the MRI of the lumbar spine dated March 31, 2019 FINDINGS: There is no evidence of lumbar spine fracture. Minimal dextroconvex rotoscoliosis. About 6 mm anterolisthesis of L4 on L5. Moderate lumbar spondylosis with moderate facet hypertrophic changes more so at L3-L4 through L5-S1. Loss of the disc space at L5-S1 with endplate sclerosis. Moderately severe atherosclerotic calcifications of the abdominal aorta and iliac arteries. IMPRESSION: 1.  No fracture seen. 2. Moderate lumbar spondylosis with grade 1 anterolisthesis at L4-L5, stable. 3. Loss of the disc space with endplate sclerotic changes at L5-S1, stable. Electronically Signed   By: Frazier Richards M.D.   On: 08/26/2021 09:20   CT Head Wo Contrast  Result Date: 08/26/2021 CLINICAL DATA:  Mental status change.  Fall. EXAM: CT HEAD WITHOUT CONTRAST TECHNIQUE: Contiguous axial images were obtained from the base of the skull through the vertex without intravenous contrast. RADIATION DOSE REDUCTION: This exam was performed according to the departmental dose-optimization program which includes automated exposure control, adjustment of the mA and/or kV according to patient size and/or use of iterative reconstruction technique. COMPARISON:  CT  scan of the brain September 22, 2020 FINDINGS: Brain: No subdural, epidural, or subarachnoid hemorrhage. Moderate to severe white matter changes are similar in the interval. Cerebellum, brainstem, and basal cisterns are normal. No cortical ischemia or interval infarct identified. No mass effect or midline shift. Ventricles and sulci are prominent but stable. Vascular: Calcified atherosclerotic changes are identified in the intracranial carotids. Skull: Normal. Negative for fracture or focal lesion. Sinuses/Orbits: No acute finding. Other: None. IMPRESSION: No acute intracranial abnormalities.  Chronic white matter changes. Electronically Signed   By: Dorise Bullion III M.D.   On: 08/26/2021 08:38    DISCHARGE EXAMINATION: Vitals:   08/29/21 0903 08/29/21 1238 08/29/21 2110 08/30/21 0540  BP: (!) 168/74 (!) 133/58 (!) 150/58 (!) 154/68  Pulse:  71 71 68  Resp:  18 16 17   Temp:  (!) 97.3 F (36.3 C) 97.9 F (36.6 C) 97.7 F (36.5 C)  TempSrc:  Oral Oral Oral  SpO2:  96% 95% 99%  Weight:      Height:       General appearance: Awake alert.  In no distress Resp: Diminished air entry at the bases.  Normal effort at rest. Cardio: S1-S2 is normal regular.  No S3-S4.  No rubs murmurs or bruit GI: Abdomen is soft.  Nontender nondistended.  Bowel sounds are present normal.  No masses organomegaly   DISPOSITION: SNF  Discharge Instructions     Call MD for:  difficulty breathing, headache or visual disturbances   Complete by: As directed    Call MD for:  extreme fatigue   Complete by: As directed    Call MD for:  persistant dizziness or light-headedness   Complete by: As directed    Call MD for:  persistant nausea and vomiting   Complete by: As directed    Call MD for:  severe uncontrolled pain   Complete by: As directed    Call MD for:  temperature >100.4   Complete by: As directed    Diet general   Complete by: As directed    Discharge instructions   Complete by: As directed    Please  review instructions on the discharge summary.  You were cared for by a hospitalist during your hospital stay. If you have any questions about your discharge medications or the care you received while you were in the hospital after you are discharged, you can call the unit and asked to speak with the hospitalist on call if the hospitalist that took care of you is not available. Once you are discharged, your primary care physician will handle any further medical issues. Please note that NO REFILLS for any discharge medications will be authorized once you are discharged, as it is imperative that you return to your primary care physician (or establish a relationship with a primary care physician if you do not have one) for your aftercare needs so that they can reassess your need for medications and monitor your lab values. If you do not have a primary care physician, you can call [phone] for a physician referral.   Increase activity slowly   Complete by: As directed          Allergies as of 08/30/2021       Reactions   Prednisone Other (See Comments)   Manic tendencies   Amlodipine Swelling   Amoxicillin Other (See Comments)   Unknown reaction   Amoxicillin-pot Clavulanate Diarrhea   Tolerated Unasyn 08/2020   Lactose Intolerance (gi) Other (See Comments)   Unknown reaction   Limonene Diarrhea   Sulfa Antibiotics Other (See Comments)   Unknown reaction        Medication List     STOP taking these medications    buPROPion 150 MG 12 hr tablet Commonly known as: WELLBUTRIN SR   clonazePAM 0.5 MG tablet Commonly known as: KLONOPIN Replaced by: clonazePAM 0.25 MG disintegrating tablet       TAKE these medications    acetaminophen 325 MG tablet Commonly known as: TYLENOL Take 2 tablets (650 mg total) by mouth 3 (three) times daily for 5 days.   acetaminophen 325 MG tablet Commonly known as: TYLENOL Take 2 tablets (650 mg total) by mouth every 6 (six) hours as needed for mild  pain (or Fever >/= 101).   aspirin 325 MG tablet Take 325 mg by mouth daily.   atorvastatin 40 MG tablet Commonly known as: LIPITOR Take 1 tablet (40 mg total) by mouth at bedtime.   Bacid Caps Take 1 capsule by mouth 2 (two) times daily. Bacid with Lactospore 1 billion cell   cetirizine 5 MG tablet Commonly known as: ZYRTEC Take 5 mg by mouth every morning.   clonazePAM 0.25 MG disintegrating tablet Commonly known as:  KLONOPIN Take 1 tablet 3 times a day for 3 days followed by 1 tablet twice a day for 3 days and then 1 tablet twice a day as needed for anxiety Replaces: clonazePAM 0.5 MG tablet   conjugated estrogens vaginal cream Commonly known as: PREMARIN Place 1 applicator vaginally every Monday, Wednesday, and Friday. At bedtime   ferrous sulfate 325 (65 FE) MG tablet Take 325 mg by mouth every morning.   fluticasone 50 MCG/ACT nasal spray Commonly known as: FLONASE Place 2 sprays into both nostrils daily as needed for allergies or rhinitis. What changed:  how much to take when to take this   fosfomycin 3 g Pack Commonly known as: MONUROL Take 3 g by mouth once for 1 dose. Give on 09/02/2021 Start taking on: September 02, 2021   gabapentin 100 MG capsule Commonly known as: NEURONTIN Take 2 tablets twice a day for 1 week and then increase to 3 tablets twice a day What changed:  how much to take how to take this when to take this additional instructions Another medication with the same name was removed. Continue taking this medication, and follow the directions you see here.   Gemtesa 75 MG Tabs Generic drug: Vibegron Take 75 mg by mouth daily.   lidocaine 5 % Commonly known as: LIDODERM Place 1 patch onto the skin daily. Remove & Discard patch within 12 hours or as directed by MD   lisinopril 20 MG tablet Commonly known as: ZESTRIL Take 20 mg by mouth every morning.   mirtazapine 7.5 MG tablet Commonly known as: REMERON Take 1 tablet (7.5 mg total) by  mouth at bedtime. What changed:  medication strength how much to take   multivitamin with minerals Tabs tablet Take 1 tablet by mouth every morning.   pantoprazole 40 MG tablet Commonly known as: PROTONIX Take 40 mg by mouth every morning.   polyethylene glycol 17 g packet Commonly known as: MIRALAX / GLYCOLAX Take 17 g by mouth 2 (two) times daily.   senna-docusate 8.6-50 MG tablet Commonly known as: Senokot-S Take 2 tablets by mouth at bedtime.   sertraline 100 MG tablet Commonly known as: ZOLOFT Take 100 mg by mouth every morning.   traMADol 50 MG tablet Commonly known as: ULTRAM Take 1 tablet (50 mg total) by mouth every 6 (six) hours as needed for moderate pain.          Contact information for follow-up providers     Javier Glazier, MD. Schedule an appointment as soon as possible for a visit in 1 week(s).   Specialty: Internal Medicine Why: post hospitalization follow up Contact information: [place] [phone]              Contact information for after-discharge care     Destination     [facility] .   Service: Skilled Nursing Contact information: [place] [phone]                     TOTAL DISCHARGE TIME: 105 minutes  Bonnielee Haff  [facility] Pager on [email or web address]  08/30/2021, 12:10 PM

## 2021-09-25 ENCOUNTER — Emergency Department (HOSPITAL_COMMUNITY)
Admission: EM | Admit: 2021-09-25 | Discharge: 2021-09-26 | Disposition: A | Discharge status: Skilled Nursing Facility | Payer: Medicare Other | Attending: Emergency Medicine | Admitting: Emergency Medicine

## 2021-09-25 ENCOUNTER — Encounter (HOSPITAL_COMMUNITY): Payer: Self-pay

## 2021-09-25 DIAGNOSIS — I129 Hypertensive chronic kidney disease with stage 1 through stage 4 chronic kidney disease, or unspecified chronic kidney disease: Secondary | ICD-10-CM | POA: Diagnosis not present

## 2021-09-25 DIAGNOSIS — Z87891 Personal history of nicotine dependence: Secondary | ICD-10-CM | POA: Insufficient documentation

## 2021-09-25 DIAGNOSIS — W06XXXA Fall from bed, initial encounter: Secondary | ICD-10-CM | POA: Insufficient documentation

## 2021-09-25 DIAGNOSIS — N189 Chronic kidney disease, unspecified: Secondary | ICD-10-CM | POA: Diagnosis not present

## 2021-09-25 DIAGNOSIS — W19XXXA Unspecified fall, initial encounter: Secondary | ICD-10-CM

## 2021-09-25 DIAGNOSIS — F0390 Unspecified dementia without behavioral disturbance: Secondary | ICD-10-CM | POA: Insufficient documentation

## 2021-09-25 DIAGNOSIS — S0003XA Contusion of scalp, initial encounter: Secondary | ICD-10-CM | POA: Diagnosis not present

## 2021-09-25 DIAGNOSIS — S0990XA Unspecified injury of head, initial encounter: Secondary | ICD-10-CM | POA: Diagnosis present

## 2021-09-25 HISTORY — DX: Unspecified dementia, unspecified severity, without behavioral disturbance, psychotic disturbance, mood disturbance, and anxiety: F03.90

## 2021-09-25 NOTE — ED Triage Notes (Signed)
Pt from [facility], BIB [facility], pt fell about 3 hours getting up to BR from bed, has laceration to posterior head, daughter did not want pt brought in, facility called EMS d/t unable to stop bleeding. Bleeding controlled at this time, NOT on thinners, pt is at baseline, slow to respond and dementia. Has hx of falls. VSS per EMS

## 2021-09-26 ENCOUNTER — Emergency Department (HOSPITAL_COMMUNITY): Payer: Medicare Other

## 2021-09-26 DIAGNOSIS — S0003XA Contusion of scalp, initial encounter: Secondary | ICD-10-CM | POA: Diagnosis not present

## 2021-09-26 NOTE — ED Notes (Signed)
Pt continues to rest, observe even RR and unlabored, NAD noted, side rails up x2 for safety, plan of care ongoing, call light within reach, no further concerns as of present.   

## 2021-09-26 NOTE — ED Notes (Signed)
Pt appears to be sleeping, observe even RR and unlabored, NAD noted, side rails up x2 for safety, plan of care ongoing, call light within reach, no further concerns as of present.   

## 2021-09-26 NOTE — Discharge Instructions (Signed)
You were evaluated in the Emergency Department and after careful evaluation, we did not find any emergent condition requiring admission or further testing in the hospital.  Your exam/testing today was overall reassuring.  CT scan did not show any significant injury.  Recommend Tylenol for any lingering discomfort.  Please return to the Emergency Department if you experience any worsening of your condition.  Thank you for allowing [place] to be a part of your care.

## 2021-09-26 NOTE — ED Notes (Signed)
[facility], spoke with Madelaine Bhat, advised pt will be returning back to the facility by EMS, all questions and concerns address.

## 2021-09-26 NOTE — ED Notes (Signed)
[facility] has arrived to transport pt back to facility

## 2021-09-26 NOTE — ED Notes (Signed)
[facility] called for transport back to [facility]

## 2021-09-26 NOTE — ED Provider Notes (Signed)
[facility] DEPT [facility] Emergency Department Provider Note MRN:  [id number]  Arrival date & time: 09/26/21     Chief Complaint   Fall   History of Present Illness   Samantha Crane is a 88 y.o. year-old female with a history of CKD, dementia presenting to the ED with chief complaint of fall.  Larey Seat out of bed.  History of dementia.  Does not take blood thinners.  Bleeding from the back of the head.  Reportedly at her baseline.  Review of Systems  A thorough review of systems was obtained and all systems are negative except as noted in the HPI and PMH.   Patient's Health History    Past Medical History:  Diagnosis Date   Anxiety    Chronic kidney disease    pt denies, states chronic bladder infections   Dementia (HCC)    Depression    Hyperlipidemia    Hypertension    IBS (irritable bowel syndrome)    Orthostatic tremor    TIA (transient ischemic attack)    Vitamin D deficiency     Past Surgical History:  Procedure Laterality Date   lumbar spinal stenosis      Family History  Problem Relation Age of Onset   Heart attack Father    Stroke Mother    Stroke Brother    Stroke Paternal Aunt    Allergic rhinitis Neg Hx    Angioedema Neg Hx    Asthma Neg Hx    Immunodeficiency Neg Hx    Eczema Neg Hx    Urticaria Neg Hx     Social History   Socioeconomic History   Marital status: Married    Spouse name: Not on file   Number of children: 3   Years of education: Not on file   Highest education level: Some college, no degree  Occupational History   Occupation: retired    Comment: Administrator, part time  Tobacco Use   Smoking status: Former    Packs/day: 0.25    Types: Cigarettes   Smokeless tobacco: Never   Tobacco comments:    quit 30 years ago; social smoker  Vaping Use   Vaping Use: Never used  Substance and Sexual Activity   Alcohol use: No    Alcohol/week: 0.0 standard drinks of alcohol    Comment: rarely   Drug use: No   Sexual  activity: Not on file  Other Topics Concern   Not on file  Social History Narrative   Not on file   Social Determinants of Health   Financial Resource Strain: Not on file  Food Insecurity: Not on file  Transportation Needs: Not on file  Physical Activity: Not on file  Stress: Not on file  Social Connections: Not on file  Intimate Partner Violence: Not on file     Physical Exam   Vitals:   09/26/21 0030 09/26/21 0100  BP: (!) 163/62 (!) 165/61  Pulse: (!) 58 (!) 54  Resp: 17 15  Temp:    SpO2: 95% 98%    CONSTITUTIONAL: Well-appearing, NAD NEURO/PSYCH:  Alert, oriented to name, follows commands, moves all extremities EYES:  eyes equal and reactive ENT/NECK:  no LAD, no JVD CARDIO: Regular rate, well-perfused, normal S1 and S2 PULM:  CTAB no wheezing or rhonchi GI/GU:  non-distended, non-tender MSK/SPINE:  No gross deformities, no edema SKIN:  no rash, atraumatic   *Additional and/or pertinent findings included in MDM below  Diagnostic and Interventional Summary    EKG Interpretation  Date/Time:    Ventricular Rate:    PR Interval:    QRS Duration:   QT Interval:    QTC Calculation:   R Axis:     Text Interpretation:         Labs Reviewed - No data to display  CT HEAD WO CONTRAST ( )  Final Result    CT CERVICAL SPINE WO CONTRAST  Final Result      Medications - No data to display   Procedures  /  Critical Care Procedures  ED Course and Medical Decision Making  Initial Impression and Ddx Fall off the bed.  Signs of occipital head trauma.  There is a hematoma and an overlying abrasion but no laceration.  Given advanced age will obtain CT imaging.  Past medical/surgical history that increases complexity of ED encounter: None  Interpretation of Diagnostics CT imaging is reassuring without significant traumatic injuries  Patient Reassessment and Ultimate Disposition/Management     Patient is appropriate for discharge.  Patient management  required discussion with the following services or consulting groups:  None  Complexity of Problems Addressed Acute illness or injury that poses threat of life of bodily function  Additional Data Reviewed and Analyzed Further history obtained from: Further history from spouse/family member  Additional Factors Impacting ED Encounter Risk None  Elmer Sow. Pilar Plate, MD [facility] Emergency Medicine [facility] [email or web address]  Final Clinical Impressions(s) / ED Diagnoses     ICD-10-CM   1. Fall, initial encounter  W19.XXXA     2. Hematoma of scalp, initial encounter  [email or web address]       ED Discharge Orders     None        Discharge Instructions Discussed with and Provided to Patient:     Discharge Instructions      You were evaluated in the Emergency Department and after careful evaluation, we did not find any emergent condition requiring admission or further testing in the hospital.  Your exam/testing today was overall reassuring.  CT scan did not show any significant injury.  Recommend Tylenol for any lingering discomfort.  Please return to the Emergency Department if you experience any worsening of your condition.  Thank you for allowing [place] to be a part of your care.        [name], MD 09/26/21 [phone]

## 2022-07-23 ENCOUNTER — Other Ambulatory Visit (HOSPITAL_COMMUNITY): Payer: Self-pay | Admitting: Family Medicine

## 2022-07-23 DIAGNOSIS — G8929 Other chronic pain: Secondary | ICD-10-CM

## 2022-07-23 DIAGNOSIS — M79605 Pain in left leg: Secondary | ICD-10-CM

## 2022-07-29 ENCOUNTER — Ambulatory Visit (HOSPITAL_COMMUNITY): Payer: Medicare Other

## 2022-07-29 ENCOUNTER — Encounter (HOSPITAL_COMMUNITY): Payer: Self-pay

## 2022-07-29 ENCOUNTER — Ambulatory Visit (HOSPITAL_COMMUNITY)
Admission: RE | Admit: 2022-07-29 | Discharge: 2022-07-29 | Disposition: A | Discharge status: Home / Self Care | Payer: Medicare Other | Source: Ambulatory Visit | Attending: Family Medicine | Admitting: Family Medicine

## 2022-07-29 DIAGNOSIS — G8929 Other chronic pain: Secondary | ICD-10-CM | POA: Diagnosis present

## 2022-07-29 DIAGNOSIS — M79605 Pain in left leg: Secondary | ICD-10-CM | POA: Diagnosis present

## 2022-07-29 DIAGNOSIS — M5410 Radiculopathy, site unspecified: Secondary | ICD-10-CM | POA: Insufficient documentation

## 2022-08-09 ENCOUNTER — Ambulatory Visit (HOSPITAL_COMMUNITY): Admission: RE | Admit: 2022-08-09 | Payer: Medicare Other | Source: Ambulatory Visit

## 2022-08-09 DIAGNOSIS — M79605 Pain in left leg: Secondary | ICD-10-CM | POA: Diagnosis present

## 2022-08-09 DIAGNOSIS — G8929 Other chronic pain: Secondary | ICD-10-CM | POA: Insufficient documentation

## 2022-08-09 DIAGNOSIS — M5410 Radiculopathy, site unspecified: Secondary | ICD-10-CM | POA: Diagnosis present

## 2022-08-24 IMAGING — CR DG FEMUR 2+V*R*
4 series · 4 of 4 positions shown · non-contrast
Comparison: None.

CLINICAL DATA: Right leg pain

EXAM:
RIGHT FEMUR 2 VIEWS

[x femur proximal ap right]
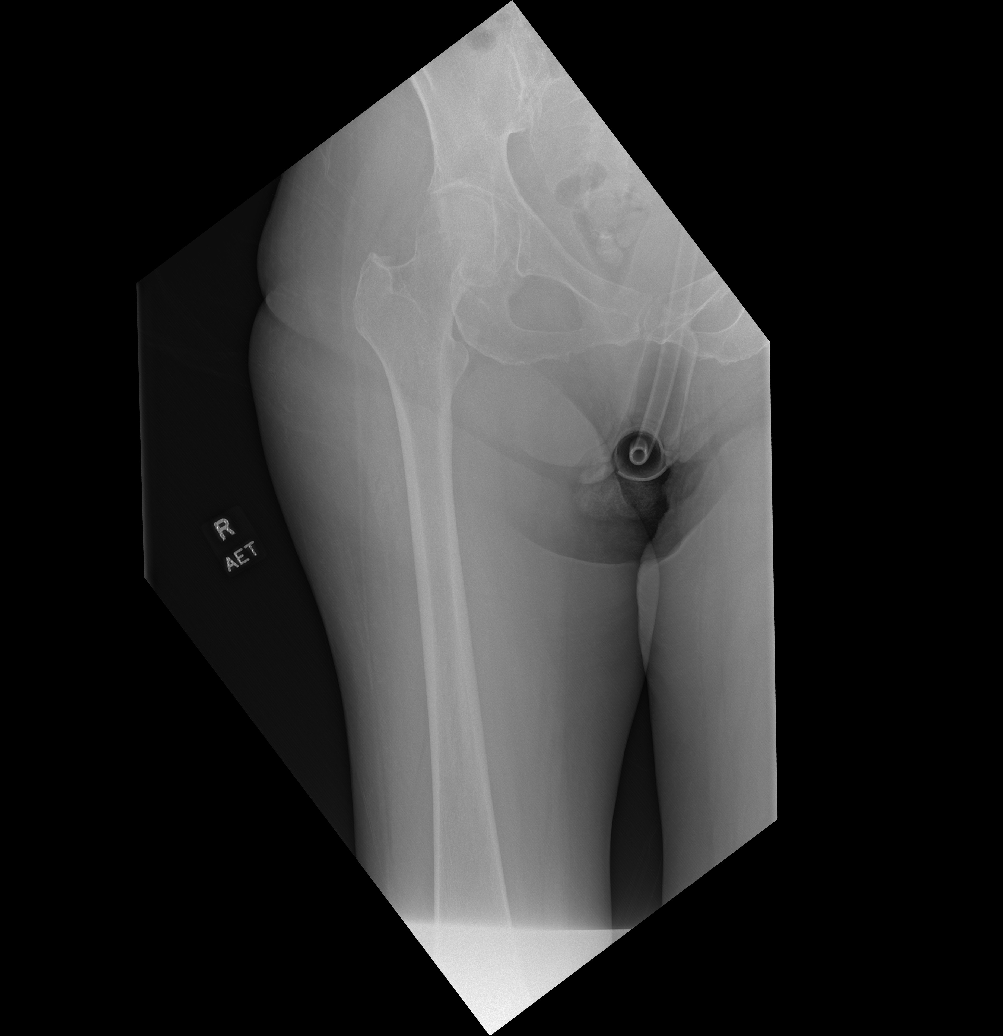

[x femur distal ap right]
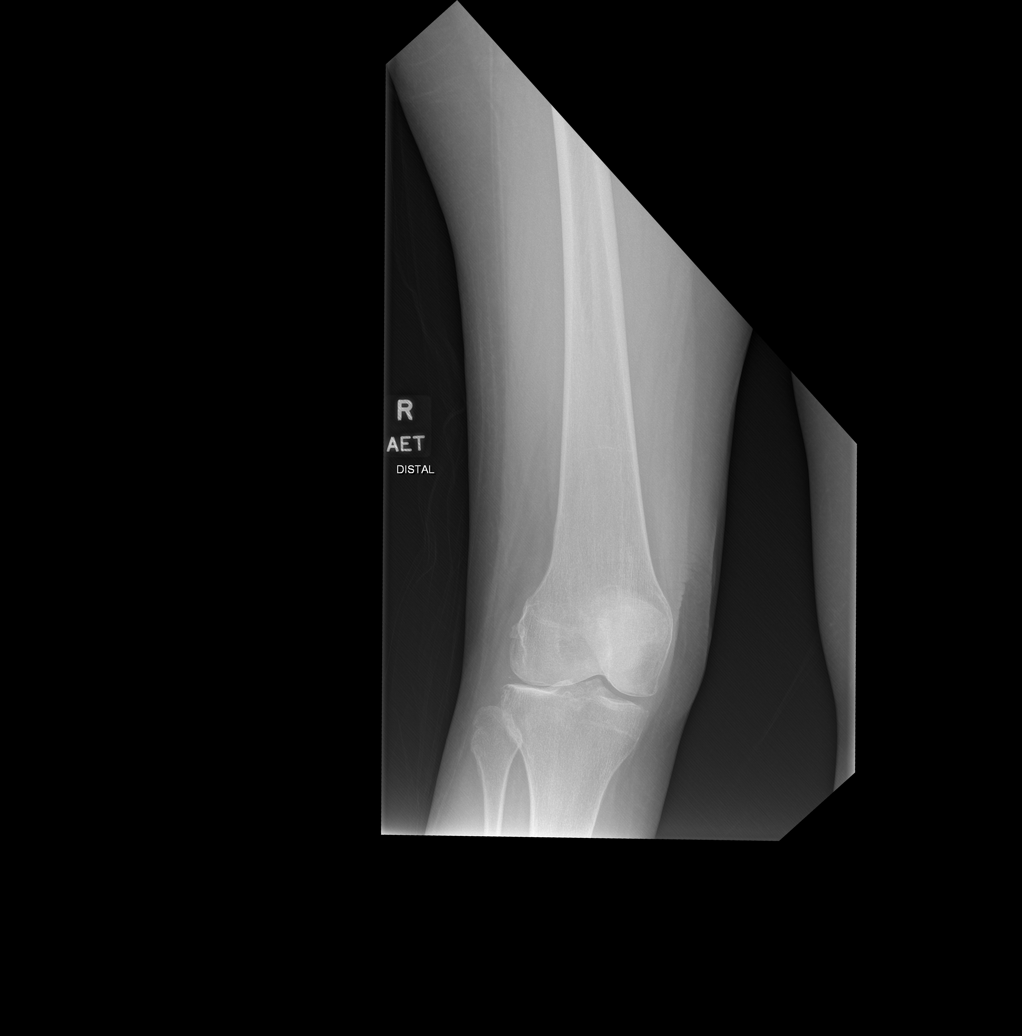

[x hip lat right]
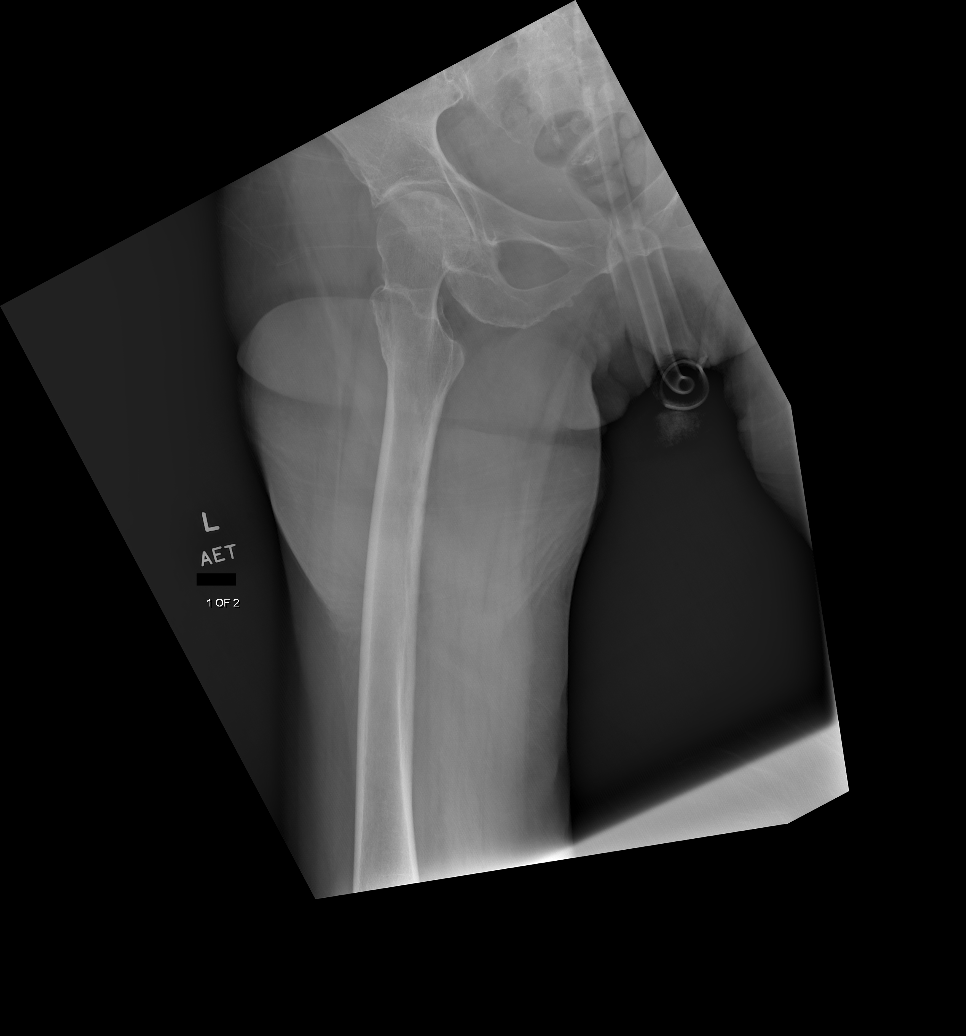

[x femur distal lat right]
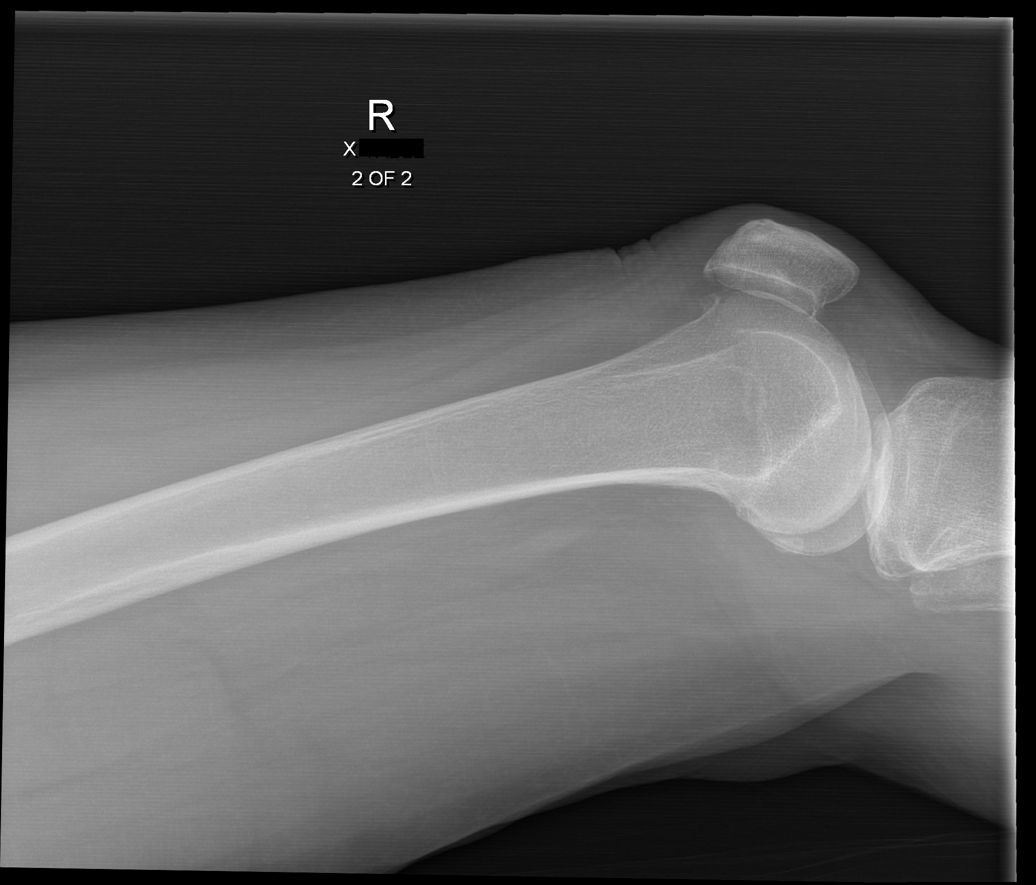

[4 of 4 positions shown; findings below may reference images not displayed]

FINDINGS: No fracture or dislocation is seen.

Mild degenerative changes in the knee.

No suprapatellar knee joint effusion.
IMPRESSION: Negative.

## 2022-08-24 IMAGING — CT CT KNEE*R* W/O CM
3 series · 16 of 33 positions shown, 19 images · non-contrast
Comparison: Right femur radiograph dated 12/22/2020.

CLINICAL DATA: Right knee pain.  Concern for stress fracture.

EXAM:
CT OF THE LOWER RIGHT EXTREMITY WITHOUT CONTRAST
TECHNIQUE: Multidetector CT imaging of the right lower extremity was performed
according to the standard protocol.

[Series 4: axial st · axial · 0.35mm/px · z∈[-242,-112]mm · 8 of 77 slices shown, 10 images]
[im 6/77  soft-tissue]
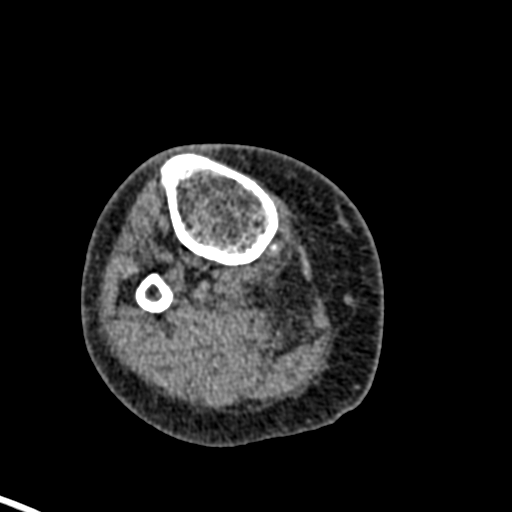
[im 6/77  bone]
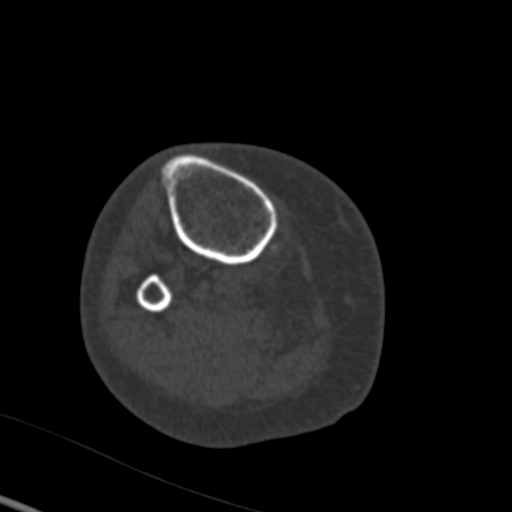
[im 18/77  bone]
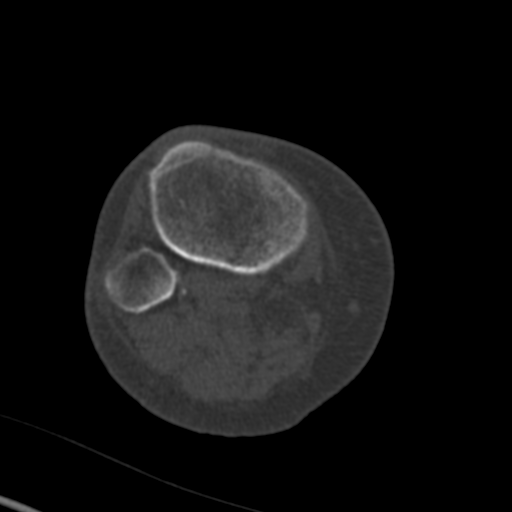
[im 24/77  bone]
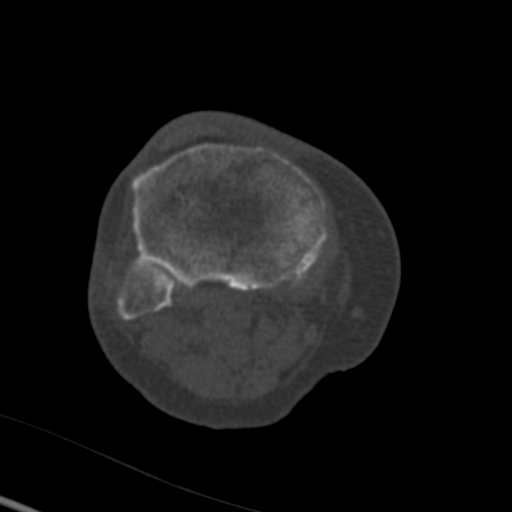
[im 36/77  bone]
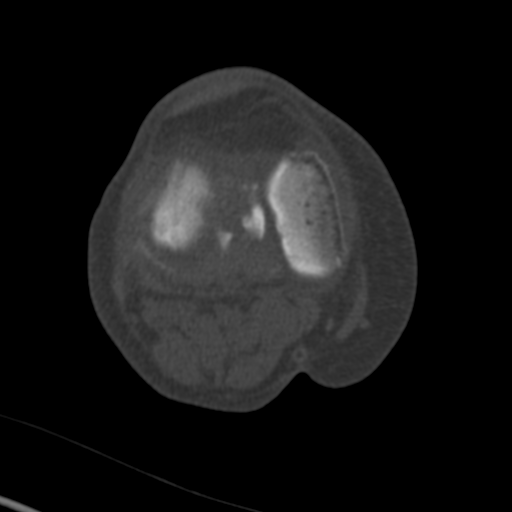
[im 41/77  soft-tissue]
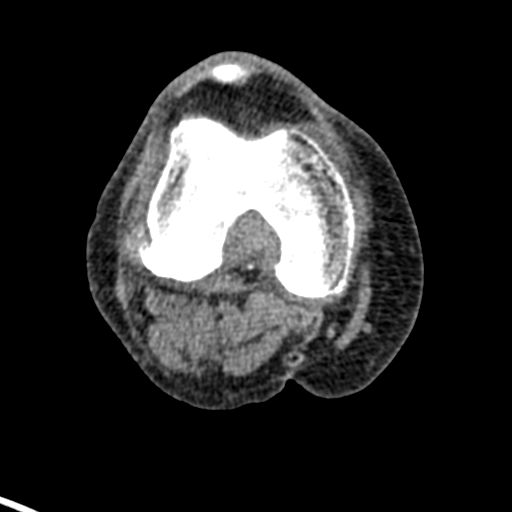
[im 41/77  bone]
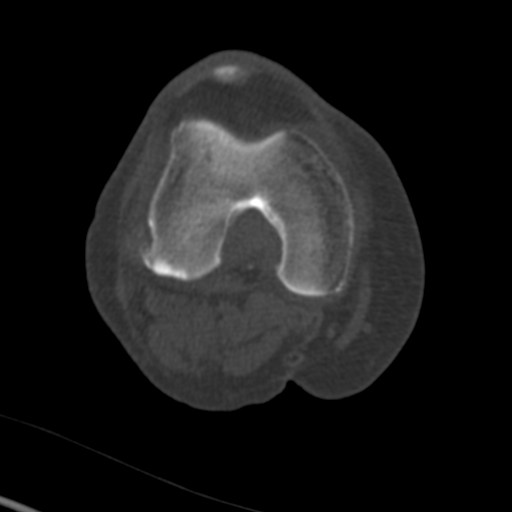
[im 53/77  bone]
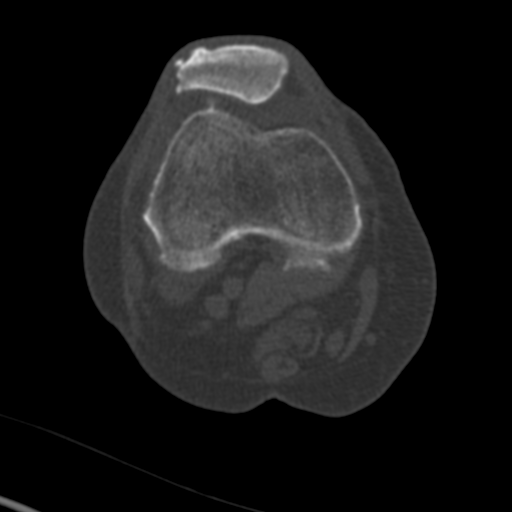
[im 59/77  bone]
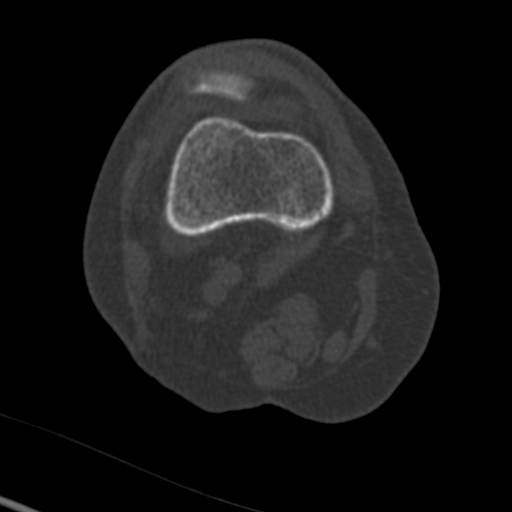
[im 71/77  bone]
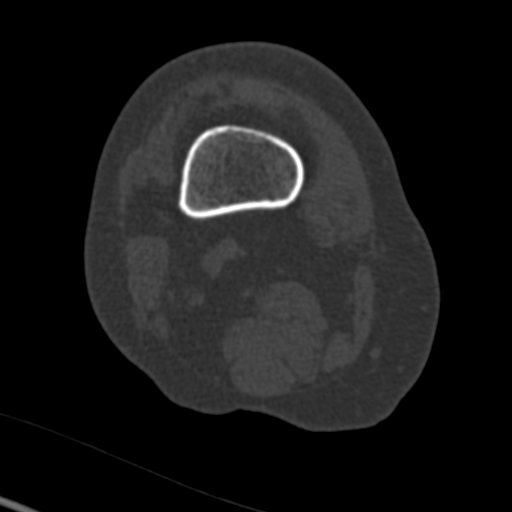

[Series 9: coronal st · coronal · 0.30mm/px · 3 of 85 slices shown]
[im 17/85  bone]
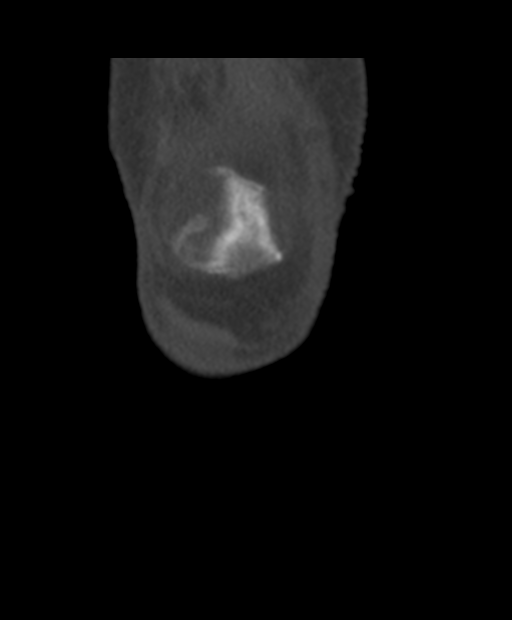
[im 34/85  bone]
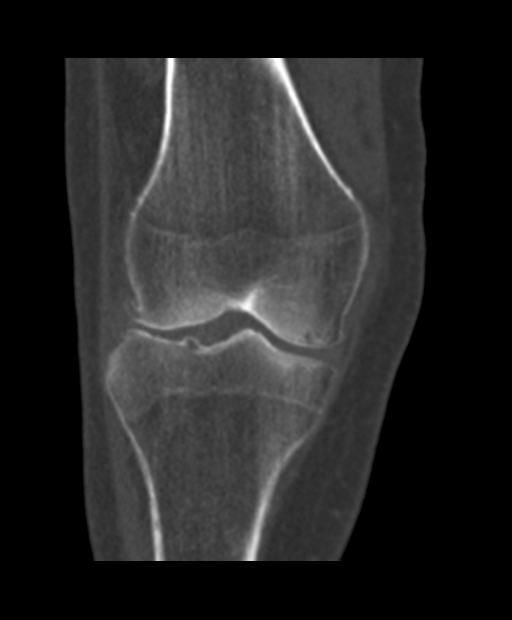
[im 51/85  bone]
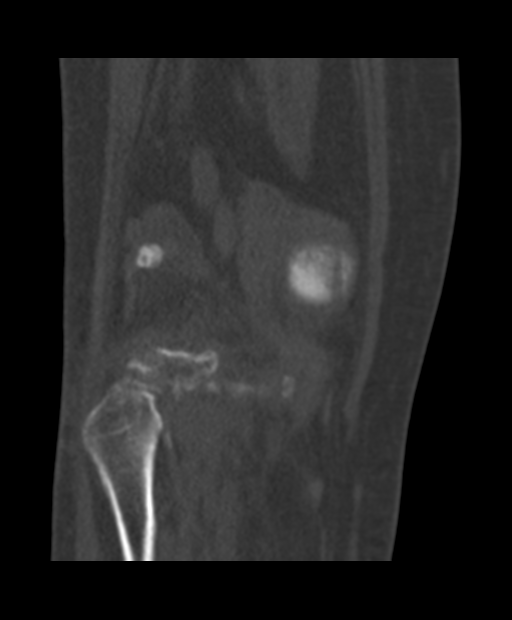

[Series 10: sagittal st · sagittal · 0.34mm/px · 5 of 62 slices shown, 6 images]
[im 21/62  bone]
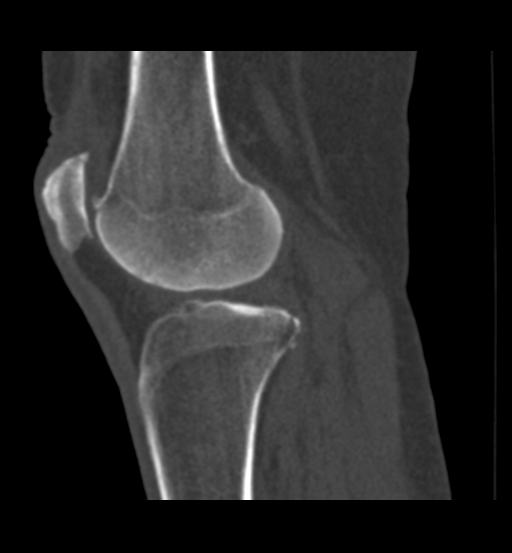
[im 26/62  bone]
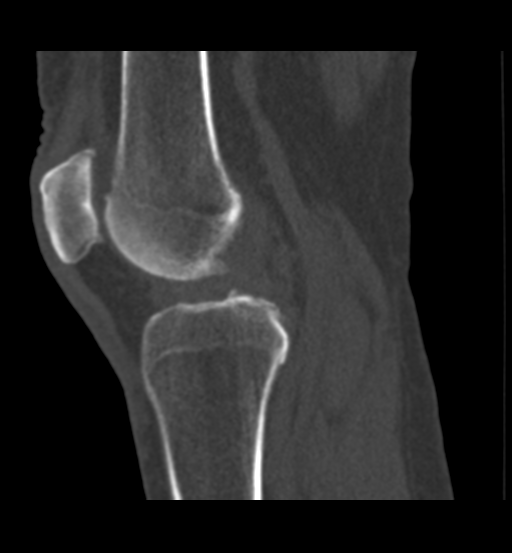
[im 31/62  soft-tissue]
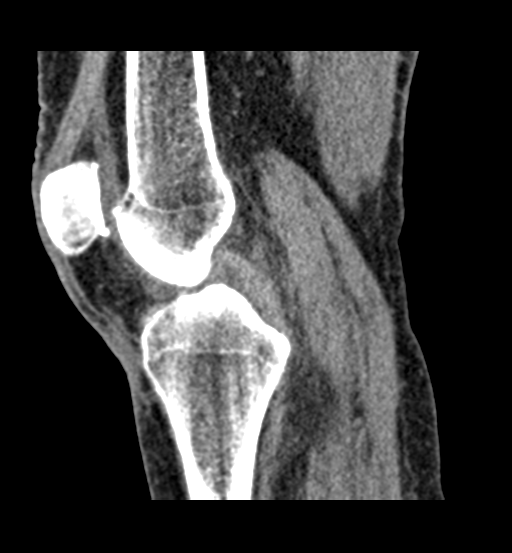
[im 31/62  bone]
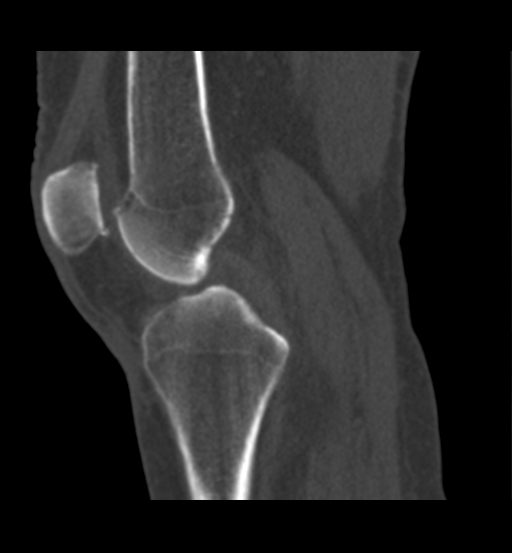
[im 36/62  bone]
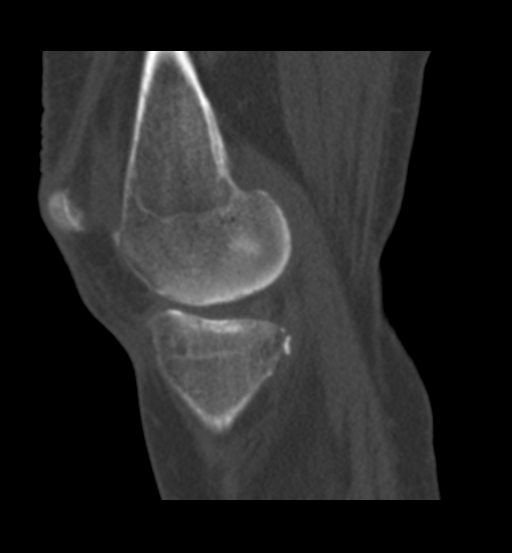
[im 41/62  bone]
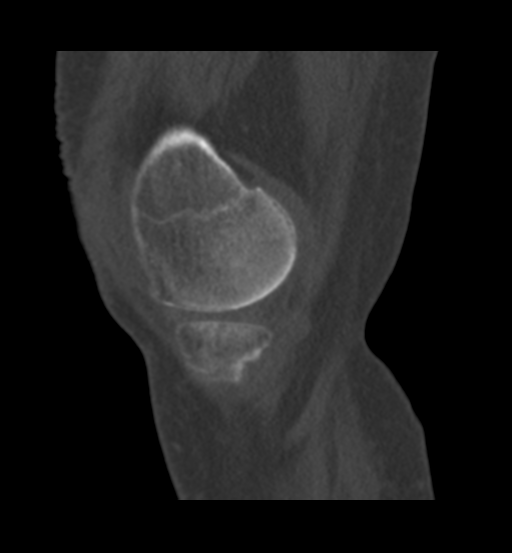

[16 of 33 positions shown; findings below may reference images not displayed]

FINDINGS: Bones/Joint/Cartilage

Minimally displaced fracture of the lateral edge of the patella. No
other acute fracture. No dislocation. There is a moderate
suprapatellar effusion.

Ligaments

Suboptimally assessed by CT.

Muscles and Tendons

No acute findings.

Soft tissues

No acute findings.
IMPRESSION: 1. Minimally displaced fracture of the lateral edge of the patella.
2. Moderate suprapatellar effusion.

## 2022-10-26 ENCOUNTER — Other Ambulatory Visit: Payer: Self-pay

## 2022-10-26 ENCOUNTER — Encounter (HOSPITAL_COMMUNITY): Payer: Self-pay | Admitting: Emergency Medicine

## 2022-10-26 ENCOUNTER — Inpatient Hospital Stay (HOSPITAL_COMMUNITY)
Admission: EM | Admit: 2022-10-26 | Discharge: 2022-10-30 | DRG: 291 | Disposition: A | Discharge status: Skilled Nursing Facility | Payer: Medicare Other | Source: Skilled Nursing Facility | Attending: Internal Medicine | Admitting: Internal Medicine

## 2022-10-26 ENCOUNTER — Emergency Department (HOSPITAL_COMMUNITY): Payer: Medicare Other

## 2022-10-26 DIAGNOSIS — Z66 Do not resuscitate: Secondary | ICD-10-CM | POA: Diagnosis present

## 2022-10-26 DIAGNOSIS — Z823 Family history of stroke: Secondary | ICD-10-CM

## 2022-10-26 DIAGNOSIS — R400 Somnolence: Principal | ICD-10-CM

## 2022-10-26 DIAGNOSIS — I10 Essential (primary) hypertension: Secondary | ICD-10-CM | POA: Diagnosis not present

## 2022-10-26 DIAGNOSIS — E869 Volume depletion, unspecified: Secondary | ICD-10-CM | POA: Diagnosis present

## 2022-10-26 DIAGNOSIS — Z882 Allergy status to sulfonamides status: Secondary | ICD-10-CM

## 2022-10-26 DIAGNOSIS — I5031 Acute diastolic (congestive) heart failure: Secondary | ICD-10-CM | POA: Diagnosis not present

## 2022-10-26 DIAGNOSIS — R5383 Other fatigue: Secondary | ICD-10-CM

## 2022-10-26 DIAGNOSIS — E875 Hyperkalemia: Secondary | ICD-10-CM | POA: Diagnosis present

## 2022-10-26 DIAGNOSIS — Z792 Long term (current) use of antibiotics: Secondary | ICD-10-CM

## 2022-10-26 DIAGNOSIS — F0393 Unspecified dementia, unspecified severity, with mood disturbance: Secondary | ICD-10-CM | POA: Diagnosis present

## 2022-10-26 DIAGNOSIS — G8929 Other chronic pain: Secondary | ICD-10-CM | POA: Diagnosis present

## 2022-10-26 DIAGNOSIS — N189 Chronic kidney disease, unspecified: Secondary | ICD-10-CM | POA: Diagnosis present

## 2022-10-26 DIAGNOSIS — Z888 Allergy status to other drugs, medicaments and biological substances status: Secondary | ICD-10-CM

## 2022-10-26 DIAGNOSIS — N179 Acute kidney failure, unspecified: Secondary | ICD-10-CM | POA: Diagnosis present

## 2022-10-26 DIAGNOSIS — F32A Depression, unspecified: Secondary | ICD-10-CM | POA: Diagnosis present

## 2022-10-26 DIAGNOSIS — Z87440 Personal history of urinary (tract) infections: Secondary | ICD-10-CM | POA: Diagnosis not present

## 2022-10-26 DIAGNOSIS — G928 Other toxic encephalopathy: Principal | ICD-10-CM | POA: Diagnosis present

## 2022-10-26 DIAGNOSIS — I130 Hypertensive heart and chronic kidney disease with heart failure and stage 1 through stage 4 chronic kidney disease, or unspecified chronic kidney disease: Secondary | ICD-10-CM | POA: Diagnosis present

## 2022-10-26 DIAGNOSIS — I959 Hypotension, unspecified: Secondary | ICD-10-CM | POA: Diagnosis present

## 2022-10-26 DIAGNOSIS — F0394 Unspecified dementia, unspecified severity, with anxiety: Secondary | ICD-10-CM | POA: Diagnosis present

## 2022-10-26 DIAGNOSIS — Z7982 Long term (current) use of aspirin: Secondary | ICD-10-CM

## 2022-10-26 DIAGNOSIS — Z87891 Personal history of nicotine dependence: Secondary | ICD-10-CM

## 2022-10-26 DIAGNOSIS — F419 Anxiety disorder, unspecified: Secondary | ICD-10-CM | POA: Diagnosis not present

## 2022-10-26 DIAGNOSIS — E785 Hyperlipidemia, unspecified: Secondary | ICD-10-CM | POA: Diagnosis present

## 2022-10-26 DIAGNOSIS — Z8249 Family history of ischemic heart disease and other diseases of the circulatory system: Secondary | ICD-10-CM

## 2022-10-26 DIAGNOSIS — R600 Localized edema: Secondary | ICD-10-CM | POA: Diagnosis not present

## 2022-10-26 DIAGNOSIS — E559 Vitamin D deficiency, unspecified: Secondary | ICD-10-CM | POA: Diagnosis present

## 2022-10-26 DIAGNOSIS — Z79899 Other long term (current) drug therapy: Secondary | ICD-10-CM

## 2022-10-26 DIAGNOSIS — M549 Dorsalgia, unspecified: Secondary | ICD-10-CM | POA: Diagnosis present

## 2022-10-26 DIAGNOSIS — I5033 Acute on chronic diastolic (congestive) heart failure: Secondary | ICD-10-CM | POA: Diagnosis present

## 2022-10-26 DIAGNOSIS — Z8673 Personal history of transient ischemic attack (TIA), and cerebral infarction without residual deficits: Secondary | ICD-10-CM | POA: Diagnosis not present

## 2022-10-26 DIAGNOSIS — G9340 Encephalopathy, unspecified: Secondary | ICD-10-CM | POA: Diagnosis not present

## 2022-10-26 DIAGNOSIS — T50915A Adverse effect of multiple unspecified drugs, medicaments and biological substances, initial encounter: Secondary | ICD-10-CM | POA: Diagnosis present

## 2022-10-26 DIAGNOSIS — K589 Irritable bowel syndrome without diarrhea: Secondary | ICD-10-CM | POA: Diagnosis present

## 2022-10-26 DIAGNOSIS — Z880 Allergy status to penicillin: Secondary | ICD-10-CM

## 2022-10-26 LAB — COMPREHENSIVE METABOLIC PANEL
ALT: 25 U/L (ref 0–44)
AST: 31 U/L (ref 15–41)
Albumin: 3.6 g/dL (ref 3.5–5.0)
Alkaline Phosphatase: 95 U/L (ref 38–126)
Anion gap: 7 (ref 5–15)
BUN: 38 mg/dL — ABNORMAL HIGH (ref 8–23)
CO2: 28 mmol/L (ref 22–32)
Calcium: 8.6 mg/dL — ABNORMAL LOW (ref 8.9–10.3)
Chloride: 100 mmol/L (ref 98–111)
Creatinine, Ser: 1.35 mg/dL — ABNORMAL HIGH (ref 0.44–1.00)
GFR, Estimated: 37 mL/min — ABNORMAL LOW (ref >60)
Glucose, Bld: 94 mg/dL (ref 70–99)
Potassium: 4.7 mmol/L (ref 3.5–5.1)
Sodium: 135 mmol/L (ref 135–145)
Total Bilirubin: 0.5 mg/dL (ref 0.3–1.2)
Total Protein: 6.3 g/dL — ABNORMAL LOW (ref 6.5–8.1)

## 2022-10-26 LAB — CBC WITH DIFFERENTIAL/PLATELET
Abs Immature Granulocytes: 0.02 10*3/uL (ref 0.00–0.07)
Basophils Absolute: 0 10*3/uL (ref 0.0–0.1)
Basophils Relative: 0 %
Eosinophils Absolute: 0.1 10*3/uL (ref 0.0–0.5)
Eosinophils Relative: 2 %
HCT: 34.3 % — ABNORMAL LOW (ref 36.0–46.0)
Hemoglobin: 10.9 g/dL — ABNORMAL LOW (ref 12.0–15.0)
Immature Granulocytes: 0 %
Lymphocytes Relative: 19 %
Lymphs Abs: 1.2 10*3/uL (ref 0.7–4.0)
MCH: 31.6 pg (ref 26.0–34.0)
MCHC: 31.8 g/dL (ref 30.0–36.0)
MCV: 99.4 fL (ref 80.0–100.0)
Monocytes Absolute: 0.5 10*3/uL (ref 0.1–1.0)
Monocytes Relative: 8 %
Neutro Abs: 4.3 10*3/uL (ref 1.7–7.7)
Neutrophils Relative %: 71 %
Platelets: 147 10*3/uL — ABNORMAL LOW (ref 150–400)
RBC: 3.45 MIL/uL — ABNORMAL LOW (ref 3.87–5.11)
RDW: 13.1 % (ref 11.5–15.5)
WBC: 6.1 10*3/uL (ref 4.0–10.5)
nRBC: 0 % (ref 0.0–0.2)

## 2022-10-26 LAB — TSH: TSH: 2.579 u[IU]/mL (ref 0.350–4.500)

## 2022-10-26 LAB — BRAIN NATRIURETIC PEPTIDE: B Natriuretic Peptide: 153.1 pg/mL — ABNORMAL HIGH (ref 0.0–100.0)

## 2022-10-26 LAB — LACTIC ACID, PLASMA: Lactic Acid, Venous: 1.1 mmol/L (ref 0.5–1.9)

## 2022-10-26 LAB — AMMONIA: Ammonia: 21 umol/L (ref 9–35)

## 2022-10-26 MED ORDER — ACETAMINOPHEN 325 MG PO TABS
650.0000 mg | ORAL_TABLET | Freq: Four times a day (QID) | ORAL | Status: DC | PRN
  Administered 2022-10-27 – 2022-10-30 (×4): 650 mg via ORAL
  Filled 2022-10-26 (×4): qty 2

## 2022-10-26 MED ORDER — ASPIRIN 325 MG PO TABS
325.0000 mg | ORAL_TABLET | Freq: Every day | ORAL | Status: DC
  Administered 2022-10-27 – 2022-10-30 (×4): 325 mg via ORAL
  Filled 2022-10-26 (×4): qty 1

## 2022-10-26 MED ORDER — PANTOPRAZOLE SODIUM 40 MG PO TBEC
40.0000 mg | DELAYED_RELEASE_TABLET | Freq: Every day | ORAL | Status: DC
  Administered 2022-10-27 – 2022-10-30 (×4): 40 mg via ORAL
  Filled 2022-10-26 (×4): qty 1

## 2022-10-26 MED ORDER — FUROSEMIDE 10 MG/ML IJ SOLN
40.0000 mg | Freq: Two times a day (BID) | INTRAMUSCULAR | Status: DC
  Administered 2022-10-26 – 2022-10-27 (×2): 40 mg via INTRAVENOUS
  Filled 2022-10-26 (×2): qty 4

## 2022-10-26 MED ORDER — HEPARIN SODIUM (PORCINE) 5000 UNIT/ML IJ SOLN
5000.0000 [IU] | Freq: Three times a day (TID) | INTRAMUSCULAR | Status: DC
  Administered 2022-10-26 – 2022-10-30 (×10): 5000 [IU] via SUBCUTANEOUS
  Filled 2022-10-26 (×12): qty 1

## 2022-10-26 MED ORDER — SODIUM CHLORIDE 0.9% FLUSH
3.0000 mL | Freq: Two times a day (BID) | INTRAVENOUS | Status: DC
  Administered 2022-10-26 – 2022-10-30 (×8): 3 mL via INTRAVENOUS

## 2022-10-26 MED ORDER — ATORVASTATIN CALCIUM 40 MG PO TABS
40.0000 mg | ORAL_TABLET | Freq: Every evening | ORAL | Status: DC
  Administered 2022-10-26 – 2022-10-30 (×5): 40 mg via ORAL
  Filled 2022-10-26 (×5): qty 1

## 2022-10-26 MED ORDER — ACETAMINOPHEN 650 MG RE SUPP: 650.0000 mg | Freq: Four times a day (QID) | RECTAL | Status: DC | PRN

## 2022-10-26 MED ORDER — LIDOCAINE 5 % EX PTCH
1.0000 | MEDICATED_PATCH | CUTANEOUS | Status: DC
  Administered 2022-10-27 – 2022-10-30 (×4): 1 via TRANSDERMAL
  Filled 2022-10-26 (×4): qty 1

## 2022-10-26 NOTE — ED Triage Notes (Signed)
PT BIB [facility] from [facility] [employer]. Coming in for abnormal lab work done couple days ago.  Per EMS Increased BUN/CRT and low NA. Pt has edema in BLE. Pt states having recent weight gain. Denies Chest, Shob. VSS. NAD  BP 128/70, Hr 60. Spo2 94 RA, RR 18

## 2022-10-26 NOTE — ED Notes (Signed)
ED TO INPATIENT HANDOFF REPORT  ED Nurse Name and Phone #: Vina Byrd K Raffael Bugarin Paramedic  S Name/Age/Gender Samantha Crane 89 y.o. female Room/Bed: WA02/WA02  Code Status   Code Status: Limited: Do not attempt resuscitation (DNR) -DNR-LIMITED -Do Not Intubate/DNI   Home/SNF/Other   Patient oriented to: self, place, time, and situation Is this baseline? Yes   Triage Complete: Triage complete  Chief Complaint AKI (acute kidney injury) (HCC) [N17.9]  Triage Note PT BIB [facility] from [facility]. Coming in for abnormal lab work done couple days ago.  Per EMS Increased BUN/CRT and low NA. Pt has edema in BLE. Pt states having recent weight gain. Denies Chest, Shob. VSS. NAD  BP 128/70, Hr 60. Spo2 94 RA, RR 18    Allergies Allergies  Allergen Reactions   Prednisone Other (See Comments)     Manic tendencies   Amlodipine Swelling   Amoxicillin Other (See Comments)    Unknown reaction   Amoxicillin-Pot Clavulanate Diarrhea    Tolerated Unasyn 08/2020   Lactose Intolerance (Gi) Other (See Comments)    Unknown reaction   Limonene Diarrhea   Sulfa Antibiotics Other (See Comments)    Unknown reaction    Level of Care/Admitting Diagnosis ED Disposition     ED Disposition  Admit   Condition  --   Comment  Hospital Area: [facility] [[id number]]  Level of Care: Telemetry [5]  Admit to tele based on following criteria: Monitor QTC interval  May admit patient to [facility] or [facility] if equivalent level of care is available:: Yes  Covid Evaluation: Asymptomatic - no recent exposure (last 10 days) testing not required  Diagnosis: AKI (acute kidney injury) [facility]) [[id number]]  Admitting Physician: Briscoe Deutscher [[id number]]  Attending Physician: Briscoe Deutscher [[id number]]  Certification:: I certify this patient will need inpatient services for at least 2 midnights  Expected Medical Readiness: 10/28/2022          B Medical/Surgery History Past Medical History:   Diagnosis Date   Anxiety    Chronic kidney disease    pt denies, states chronic bladder infections   Dementia (HCC)    Depression    Hyperlipidemia    Hypertension    IBS (irritable bowel syndrome)    Orthostatic tremor    TIA (transient ischemic attack)    Vitamin D deficiency    Past Surgical History:  Procedure Laterality Date   lumbar spinal stenosis       A IV Location/Drains/Wounds Patient Lines/Drains/Airways Status     Active Line/Drains/Airways     Name Placement date Placement time Site Days   Peripheral IV 10/26/22 20 G Anterior;Right Hand 10/26/22  1103  Hand  less than 1            Intake/Output Last 24 hours No intake or output data in the 24 hours ending 10/26/22 1534  Labs/Imaging Results for orders placed or performed during the hospital encounter of 10/26/22 (from the past 48 hour(s))  CBC with Differential     Status: Abnormal   Collection Time: 10/26/22 11:06 AM  Result Value Ref Range   WBC 6.1 4.0 - 10.5 K/uL   RBC 3.45 (L) 3.87 - 5.11 MIL/uL   Hemoglobin 10.9 (L) 12.0 - 15.0 g/dL   HCT 40.1 (L) 02.7 - 25.3 %   MCV 99.4 80.0 - 100.0 fL   MCH 31.6 26.0 - 34.0 pg   MCHC 31.8 30.0 - 36.0 g/dL   RDW 66.4 40.3 -  15.5 %   Platelets 147 (L) 150 - 400 K/uL   nRBC 0.0 0.0 - 0.2 %   Neutrophils Relative % 71 %   Neutro Abs 4.3 1.7 - 7.7 K/uL   Lymphocytes Relative 19 %   Lymphs Abs 1.2 0.7 - 4.0 K/uL   Monocytes Relative 8 %   Monocytes Absolute 0.5 0.1 - 1.0 K/uL   Eosinophils Relative 2 %   Eosinophils Absolute 0.1 0.0 - 0.5 K/uL   Basophils Relative 0 %   Basophils Absolute 0.0 0.0 - 0.1 K/uL   Immature Granulocytes 0 %   Abs Immature Granulocytes 0.02 0.00 - 0.07 K/uL    Comment: Performed at [facility], [address] [place], [place] [address]  Comprehensive metabolic panel     Status: Abnormal   Collection Time: 10/26/22 11:06 AM  Result Value Ref Range   Sodium 135 135 - 145 mmol/L   Potassium 4.7 3.5 -  5.1 mmol/L   Chloride 100 98 - 111 mmol/L   CO2 28 22 - 32 mmol/L   Glucose, Bld 94 70 - 99 mg/dL    Comment: Glucose reference range applies only to samples taken after fasting for at least 8 hours.   BUN 38 (H) 8 - 23 mg/dL   Creatinine, Ser 4.09 (H) 0.44 - 1.00 mg/dL   Calcium 8.6 (L) 8.9 - 10.3 mg/dL   Total Protein 6.3 (L) 6.5 - 8.1 g/dL   Albumin 3.6 3.5 - 5.0 g/dL   AST 31 15 - 41 U/L   ALT 25 0 - 44 U/L   Alkaline Phosphatase 95 38 - 126 U/L   Total Bilirubin 0.5 0.3 - 1.2 mg/dL   GFR, Estimated 37 (L) >60 mL/min    Comment: (NOTE) Calculated using the CKD-EPI Creatinine Equation (2021)    Anion gap 7 5 - 15    Comment: Performed at [facility], [address] [place], [place] [address]  Ammonia     Status: None   Collection Time: 10/26/22 11:06 AM  Result Value Ref Range   Ammonia 21 9 - 35 umol/L    Comment: Performed at [facility], [address] [place], [place] [address]  TSH     Status: None   Collection Time: 10/26/22 11:06 AM  Result Value Ref Range   TSH 2.579 0.350 - 4.500 uIU/mL    Comment: Performed by a 3rd Generation assay with a functional sensitivity of <=0.01 uIU/mL. Performed at [facility], [address] [place], [place] [address]   Brain natriuretic peptide     Status: Abnormal   Collection Time: 10/26/22 11:06 AM  Result Value Ref Range   B Natriuretic Peptide 153.1 (H) 0.0 - 100.0 pg/mL    Comment: Performed at [facility], [address] [place], [place] [address]  Lactic acid, plasma     Status: None   Collection Time: 10/26/22 11:06 AM  Result Value Ref Range   Lactic Acid, Venous 1.1 0.5 - 1.9 mmol/L    Comment: Performed at [facility], [address] [place], [place] [address]   CT HEAD WO CONTRAST ( )  Result Date: 10/26/2022 CLINICAL DATA:  89 year old female with lower extremity swelling, recent weight gain, abnormal labs,  altered mental status, confusion, fatigue. EXAM: CT HEAD WITHOUT CONTRAST TECHNIQUE: Contiguous axial images were obtained from the base of the skull through the vertex without intravenous contrast. RADIATION DOSE REDUCTION: This exam was  performed according to the departmental dose-optimization program which includes automated exposure control, adjustment of the mA and/or kV according to patient size and/or use of iterative reconstruction technique. COMPARISON:  Head CT 09/26/2021.  Brain MRI 09/22/2020. FINDINGS: Brain: Stable cerebral volume. No midline shift, ventriculomegaly, mass effect, evidence of mass lesion, intracranial hemorrhage or evidence of cortically based acute infarction. Stable patchy, confluent periventricular white matter hypodensity. Vascular: No suspicious intracranial vascular hyperdensity. Calcified atherosclerosis at the skull base. Skull: Intact, negative. Sinuses/Orbits: Visualized paranasal sinuses and mastoids are stable and well aerated. Other: Negative orbit and scalp soft tissues now. IMPRESSION: Stable non contrast CT appearance of the brain. No acute intracranial abnormality. Electronically Signed   By: Odessa Fleming M.D.   On: 10/26/2022 11:08   DG Chest Portable 1 View  Result Date: 10/26/2022 CLINICAL DATA:  89 year old female with lower extremity swelling. Recent weight gain. Abnormal labs. EXAM: PORTABLE CHEST 1 VIEW COMPARISON:  Chest radiographs 08/26/2021 and earlier. FINDINGS: Portable AP semi upright view at 1019 hours. Lower lung volumes and mildly rotated to the right. Cardiac and mediastinal contours appear stable, mild if any cardiomegaly. Visualized tracheal air column is within normal limits. Chronic right hilar scarring along the right minor fissure is stable. Elsewhere when allowing for portable technique the lungs are clear. No pneumothorax or pleural effusion. No acute osseous abnormality identified.  Paucity of bowel gas. IMPRESSION: Lower lung volumes.  No  acute cardiopulmonary abnormality. Electronically Signed   By: Odessa Fleming M.D.   On: 10/26/2022 10:59    Pending Labs Unresulted Labs (From admission, onward)     Start     Ordered   10/27/22 0500  Basic metabolic panel  Daily,   R      10/26/22 1521   10/27/22 0500  Magnesium  Tomorrow morning,   R        10/26/22 1521   10/27/22 0500  CBC  Daily,   R      10/26/22 1521   10/26/22 1522  Creatinine, urine, random  Add-on,   AD        10/26/22 1521   10/26/22 1522  Urea nitrogen, urine  Add-on,   AD        10/26/22 1521   10/26/22 1521  Sodium, urine, random  Add-on,   AD        10/26/22 1521   10/26/22 1005  Urinalysis, w/ Reflex to Culture (Infection Suspected) -Urine, Clean Catch  Once,   URGENT       Question:  Specimen Source  Answer:  Urine, Clean Catch   10/26/22 1005            Vitals/Pain Today's Vitals   10/26/22 1315 10/26/22 1330 10/26/22 1352 10/26/22 1500  BP:  (!) 88/30  (!) 136/50  Pulse: (!) 53 (!) 53  (!) 52  Resp: 14 12  16   Temp:   98.3 F (36.8 C)   TempSrc:   Oral   SpO2: 93% 95%  100%  Weight:      Height:      PainSc:        Isolation Precautions No active isolations  Medications Medications  aspirin tablet 325 mg (has no administration in time range)  atorvastatin (LIPITOR) tablet 40 mg (has no administration in time range)  pantoprazole (PROTONIX) EC tablet 40 mg (has no administration in time range)  lidocaine (LIDODERM) 5 % 1 patch (has no administration in time range)  heparin injection 5,000 Units (has no  administration in time range)  furosemide (LASIX) injection 40 mg (has no administration in time range)  sodium chloride flush (NS) 0.9 % injection 3 mL (has no administration in time range)  acetaminophen (TYLENOL) tablet 650 mg (has no administration in time range)    Or  acetaminophen (TYLENOL) suppository 650 mg (has no administration in time range)    Mobility walks with device but unable to at this time     Focused  Assessments   Pt came in for blood work.  She is now having low BP and HR when asleep.  When she wakes and talks and her vitals return to stable.  Pt was also placed on 2LPM [place] due to Sats low when asleep.   R Recommendations: See Admitting Provider Note  Report given to:   Additional Notes:

## 2022-10-26 NOTE — ED Provider Notes (Signed)
[place] EMERGENCY DEPARTMENT AT [facility] Provider Note   CSN: [id number] Arrival date & time: 10/26/22  6962     History  Chief Complaint  Patient presents with   abnormal labs    Samantha Crane is a 89 y.o. female.  The history is provided by the patient and medical records. No language interpreter was used.  Illness Location:  Several days of worsening confusion and fatigue and abnormal labs Severity:  Moderate Onset quality:  Gradual Duration:  3 days Timing:  Constant Progression:  Waxing and waning Chronicity:  New Context:  Worsening peripheral edema Associated symptoms: fatigue   Associated symptoms: no abdominal pain, no chest pain, no congestion, no cough, no diarrhea, no fever, no headaches, no loss of consciousness, no nausea, no rash, no rhinorrhea, no shortness of breath, no vomiting and no wheezing        Home Medications Prior to Admission medications   Medication Sig Start Date End Date Taking? Authorizing Provider  acetaminophen (TYLENOL) 325 MG tablet Take 2 tablets (650 mg total) by mouth every 6 (six) hours as needed for mild pain (or Fever >/= 101). 08/30/21   Osvaldo Shipper, MD  aspirin 325 MG tablet Take 325 mg by mouth daily. 09/23/20   [provider]  atorvastatin (LIPITOR) 40 MG tablet Take 1 tablet (40 mg total) by mouth at bedtime. 09/23/20 09/26/21  Darlin Drop, DO  cetirizine (ZYRTEC) 5 MG tablet Take 5 mg by mouth every morning.    [provider]  clonazepam (KLONOPIN) 0.25 MG disintegrating tablet Take 1 tablet 3 times a day for 3 days followed by 1 tablet twice a day for 3 days and then 1 tablet twice a day as needed for anxiety Patient taking differently: Take 0.25 mg by mouth 2 (two) times daily as needed (anxiety). 08/30/21   Osvaldo Shipper, MD  conjugated estrogens (PREMARIN) vaginal cream Place 1 applicator vaginally every Monday, Wednesday, and Friday. At bedtime    [provider]  ferrous  sulfate 325 (65 FE) MG tablet Take 325 mg by mouth every morning.    [provider]  fluticasone (FLONASE) 50 MCG/ACT nasal spray Place 2 sprays into both nostrils daily as needed for allergies or rhinitis. Patient taking differently: Place 1 spray into both nostrils 2 (two) times daily. 08/13/17   Bobbitt, Heywood Iles, MD  gabapentin (NEURONTIN) 100 MG capsule Take 2 tablets twice a day for 1 week and then increase to 3 tablets twice a day Patient taking differently: Take 300 mg by mouth 2 (two) times daily. 08/30/21   Osvaldo Shipper, MD  lidocaine (LIDODERM) 5 % Place 1 patch onto the skin daily. Remove & Discard patch within 12 hours or as directed by MD 08/30/21   Osvaldo Shipper, MD  lisinopril (PRINIVIL,ZESTRIL) 20 MG tablet Take 20 mg by mouth every morning.    [provider]  mirtazapine (REMERON) 7.5 MG tablet Take 1 tablet (7.5 mg total) by mouth at bedtime. 08/30/21   Osvaldo Shipper, MD  Multiple Vitamin (MULTIVITAMIN WITH MINERALS) TABS tablet Take 1 tablet by mouth every morning.    [provider]  pantoprazole (PROTONIX) 40 MG tablet Take 40 mg by mouth every morning.    [provider]  polyethylene glycol (MIRALAX / GLYCOLAX) 17 g packet Take 17 g by mouth 2 (two) times daily. 08/30/21   Osvaldo Shipper, MD  Probiotic Product [facility]) CAPS Take 1 capsule by mouth 2 (two) times daily. Bacid with [employer]  1 billion cell    [provider]  senna-docusate (SENOKOT-S) 8.6-50 MG tablet Take 2 tablets by mouth at bedtime. 08/30/21   Osvaldo Shipper, MD  sertraline (ZOLOFT) 100 MG tablet Take 100 mg by mouth every morning.    [provider]  traMADol (ULTRAM) 50 MG tablet Take 1 tablet (50 mg total) by mouth every 6 (six) hours as needed for moderate pain. 08/30/21   Osvaldo Shipper, MD  Vibegron (GEMTESA) 75 MG TABS Take 75 mg by mouth daily.    [provider]      Allergies    Prednisone, Amlodipine, Amoxicillin,  Amoxicillin-pot clavulanate, Lactose intolerance (gi), Limonene, and Sulfa antibiotics    Review of Systems   Review of Systems  Constitutional:  Positive for fatigue. Negative for chills and fever.  HENT:  Negative for congestion and rhinorrhea.   Respiratory:  Negative for cough, chest tightness, shortness of breath and wheezing.   Cardiovascular:  Positive for leg swelling. Negative for chest pain and palpitations.  Gastrointestinal:  Negative for abdominal pain, constipation, diarrhea, nausea and vomiting.  Genitourinary:  Negative for dysuria.  Musculoskeletal:  Negative for back pain, neck pain and neck stiffness.  Skin:  Negative for rash and wound.  Neurological:  Negative for loss of consciousness, speech difficulty, weakness, light-headedness, numbness and headaches.  Psychiatric/Behavioral:  Positive for confusion (per pt). Negative for agitation.   All other systems reviewed and are negative.   Physical Exam Updated Vital Signs There were no vitals taken for this visit. Physical Exam Vitals and nursing note reviewed.  Constitutional:      General: She is not in acute distress.    Appearance: She is well-developed. She is not ill-appearing, toxic-appearing or diaphoretic.  HENT:     Head: Normocephalic and atraumatic.     Nose: No congestion or rhinorrhea.     Mouth/Throat:     Pharynx: No oropharyngeal exudate or posterior oropharyngeal erythema.  Eyes:     Extraocular Movements: Extraocular movements intact.     Conjunctiva/sclera: Conjunctivae normal.     Pupils: Pupils are equal, round, and reactive to light.  Cardiovascular:     Rate and Rhythm: Regular rhythm. Bradycardia present.     Pulses: Normal pulses.     Heart sounds: No murmur heard. Pulmonary:     Effort: Pulmonary effort is normal. No respiratory distress.     Breath sounds: Normal breath sounds. No wheezing, rhonchi or rales.  Chest:     Chest wall: No tenderness.  Abdominal:     General:  Abdomen is flat.     Palpations: Abdomen is soft.     Tenderness: There is no abdominal tenderness. There is no guarding or rebound.  Musculoskeletal:        General: No swelling or tenderness.     Cervical back: Neck supple. No tenderness.     Right lower leg: Edema present.     Left lower leg: Edema present.  Skin:    General: Skin is warm and dry.     Capillary Refill: Capillary refill takes less than 2 seconds.     Findings: No erythema or rash.  Neurological:     General: No focal deficit present.     Mental Status: She is alert.     Sensory: No sensory deficit.     Motor: No weakness.  Psychiatric:        Mood and Affect: Mood normal.     ED Results / Procedures / Treatments  Labs (all labs ordered are listed, but only abnormal results are displayed) Labs Reviewed  CBC WITH DIFFERENTIAL/PLATELET - Abnormal; Notable for the following components:      Result Value   RBC 3.45 (*)    Hemoglobin 10.9 (*)    HCT 34.3 (*)    Platelets 147 (*)    All other components within normal limits  COMPREHENSIVE METABOLIC PANEL - Abnormal; Notable for the following components:   BUN 38 (*)    Creatinine, Ser 1.35 (*)    Calcium 8.6 (*)    Total Protein 6.3 (*)    GFR, Estimated 37 (*)    All other components within normal limits  BRAIN NATRIURETIC PEPTIDE - Abnormal; Notable for the following components:   B Natriuretic Peptide 153.1 (*)    All other components within normal limits  AMMONIA  TSH  LACTIC ACID, PLASMA  URINALYSIS, W/ REFLEX TO CULTURE (INFECTION SUSPECTED)    EKG None  Radiology CT HEAD WO CONTRAST ( )  Result Date: 10/26/2022 CLINICAL DATA:  89 year old female with lower extremity swelling, recent weight gain, abnormal labs, altered mental status, confusion, fatigue. EXAM: CT HEAD WITHOUT CONTRAST TECHNIQUE: Contiguous axial images were obtained from the base of the skull through the vertex without intravenous contrast. RADIATION DOSE REDUCTION: This  exam was performed according to the departmental dose-optimization program which includes automated exposure control, adjustment of the mA and/or kV according to patient size and/or use of iterative reconstruction technique. COMPARISON:  Head CT 09/26/2021.  Brain MRI 09/22/2020. FINDINGS: Brain: Stable cerebral volume. No midline shift, ventriculomegaly, mass effect, evidence of mass lesion, intracranial hemorrhage or evidence of cortically based acute infarction. Stable patchy, confluent periventricular white matter hypodensity. Vascular: No suspicious intracranial vascular hyperdensity. Calcified atherosclerosis at the skull base. Skull: Intact, negative. Sinuses/Orbits: Visualized paranasal sinuses and mastoids are stable and well aerated. Other: Negative orbit and scalp soft tissues now. IMPRESSION: Stable non contrast CT appearance of the brain. No acute intracranial abnormality. Electronically Signed   By: Odessa Fleming M.D.   On: 10/26/2022 11:08   DG Chest Portable 1 View  Result Date: 10/26/2022 CLINICAL DATA:  89 year old female with lower extremity swelling. Recent weight gain. Abnormal labs. EXAM: PORTABLE CHEST 1 VIEW COMPARISON:  Chest radiographs 08/26/2021 and earlier. FINDINGS: Portable AP semi upright view at 1019 hours. Lower lung volumes and mildly rotated to the right. Cardiac and mediastinal contours appear stable, mild if any cardiomegaly. Visualized tracheal air column is within normal limits. Chronic right hilar scarring along the right minor fissure is stable. Elsewhere when allowing for portable technique the lungs are clear. No pneumothorax or pleural effusion. No acute osseous abnormality identified.  Paucity of bowel gas. IMPRESSION: Lower lung volumes.  No acute cardiopulmonary abnormality. Electronically Signed   By: Odessa Fleming M.D.   On: 10/26/2022 10:59    Procedures Procedures    Medications Ordered in ED Medications - No data to display  ED Course/ Medical Decision Making/  A&P                                 Medical Decision Making Amount and/or Complexity of Data Reviewed Labs: ordered. Radiology: ordered.  Risk Decision regarding hospitalization.    Aunica Dauphinee is a 89 y.o. female anxiety, depression, dementia who presents with daughter for worsening fatigue, sleepiness, worsening peripheral edema, and confusion.  According to patient, for the last few days she has had worsening  fatigue and feels slightly confused.  She denies any falls and denies any new pain to me.  She does states she has had worsening edema in her extremities and is gaining weight despite taking Lasix as directed she reports.  She says that she has had no fevers, chills, congestion, cough, nausea, vomiting, constipation, diarrhea, or urinary changes.  She reports her left leg has been weeping some given the edema but no large lacerations.  Patient's daughter arrived and says that the patient has been more sleepy and somnolent and slightly altered with confusion over the last few days that has been worsening.  Daughter reports that the patient is on both medications and think she may be on a muscle relaxant as well as pain medicine for her back.  Daughter reports patient has not been having cough or other infectious type symptoms and daughter is concerned about the edema the legs causing the weeping.  Daughter also was told patient had abnormal labs that returned overnight from the facility so she was sent in for evaluation.  Daughter does state the patient has had abnormal mental status in the setting of sepsis before.  On exam, lungs clear.  Chest nontender.  Abdomen nontender.  Significant edema in both lower extremities.  Patient has a small skin tear on her left shin that has been weeping fluid but no purulence erythema or bleeding seen.  No significant tenderness.  Intact pulses, strength and sensation throughout.  Symmetric smile, clear speech.  Patient has sleepiness and somnolence  but is arousable to voice.  No focal deficits initially.  Clinically, I do agree patient is somnolent and altered compared to her normal baseline.  We will get screening labs, look for occult infection, and due to altered mental status, anticipate likely admission.  Anticipate   reassessment.  1:29 PM Workup continues to return.  BNP is elevated but improved from prior.  Creatinine is slightly worsened but does not appear critically so.  Lactic acid normal.  No leukocytosis, mild anemia.  TSH normal.  CT head and chest x-ray did not show concerning findings.  Blood pressure also soft in the low 90s and oxygen saturations dropped into the 80s, she is now on 2 L.  Patient is still sleepy and somnolent and tired, suspect polypharmacy.  Due to the patient's soft pressures, worsening edema with elevated creatinine, and now hypoxia, will call for medical admission.         Final Clinical Impression(s) / ED Diagnoses Final diagnoses:  Somnolence  Fatigue, unspecified type     Clinical Impression: 1. Somnolence   2. Fatigue, unspecified type     Disposition: Admit  This note was prepared with assistance of Dragon voice recognition software. Occasional wrong-word or sound-a-like substitutions may have occurred due to the inherent limitations of voice recognition software.     Aariv Medlock, Canary Brim, MD 10/26/22 [phone]

## 2022-10-26 NOTE — Plan of Care (Signed)
  Problem: Education: Goal: Ability to demonstrate management of disease process will improve Outcome: Progressing Goal: Ability to verbalize understanding of medication therapies will improve Outcome: Progressing   Problem: Cardiac: Goal: Ability to achieve and maintain adequate cardiopulmonary perfusion will improve Outcome: Progressing   Problem: Activity: Goal: Risk for activity intolerance will decrease Outcome: Progressing   Problem: Nutrition: Goal: Adequate nutrition will be maintained Outcome: Progressing

## 2022-10-26 NOTE — H&P (Signed)
History and Physical    Samantha Crane VWU:[id number] DOB: 07-Nov-1929 DOA: 10/26/2022  PCP: Karna Dupes, MD   Patient coming from: SNF   Chief Complaint: Somnolence, confusion, weight gain, swelling, abnormal labs   HPI: Samantha Crane is a 89 y.o. female with medical history significant for depression, anxiety, TIA, recurrent UTI on prophylactic trimethoprim, and back pain who presents with somnolence, fatigue, weight gain, bilateral lower extremity swelling, and abnormal blood work.  Patient has had recent bilateral lower extremity swelling with 10 pound weight gain in the past week.  She was going to be started on a short course of Lasix at her SNF, but blood work was obtained, creatinine was noted to have increased, and so the diuretic was not given.  Patient has also had increased back pain recently and has been on multimodal pain regimen with recent addition of a muscle relaxer.  Patient has been increasingly somnolent over the past few days which her daughter attributes to the pain medications.  Patient denies chest pain, shortness of breath, fevers, or chills.  ED Course: Upon arrival to the ED, patient is found to be afebrile and saturating low 90s on room air with heart rate in the 50s and stable blood pressure.  Labs are most notable for creatinine 1.35, hemoglobin 10.9, normal lactic acid, normal WBC, and BNP 153.  No acute findings are noted on head CT or chest x-ray.  Review of Systems:  All other systems reviewed and apart from HPI, are negative.  Past Medical History:  Diagnosis Date   Anxiety    Chronic kidney disease    pt denies, states chronic bladder infections   Dementia (HCC)    Depression    Hyperlipidemia    Hypertension    IBS (irritable bowel syndrome)    Orthostatic tremor    TIA (transient ischemic attack)    Vitamin D deficiency     Past Surgical History:  Procedure Laterality Date   lumbar spinal stenosis      Social History:   reports that  she has quit smoking. Her smoking use included cigarettes. She has never used smokeless tobacco. She reports that she does not drink alcohol and does not use drugs.  Allergies  Allergen Reactions   Prednisone Other (See Comments)     Manic tendencies   Amlodipine Swelling   Amoxicillin Other (See Comments)    Unknown reaction   Amoxicillin-Pot Clavulanate Diarrhea    Tolerated Unasyn 08/2020   Lactose Intolerance (Gi) Other (See Comments)    Unknown reaction   Limonene Diarrhea   Sulfa Antibiotics Other (See Comments)    Unknown reaction    Family History  Problem Relation Age of Onset   Heart attack Father    Stroke Mother    Stroke Brother    Stroke Paternal Aunt    Allergic rhinitis Neg Hx    Angioedema Neg Hx    Asthma Neg Hx    Immunodeficiency Neg Hx    Eczema Neg Hx    Urticaria Neg Hx      Prior to Admission medications   Medication Sig Start Date End Date Taking? Authorizing Provider  Acetaminophen (TYLENOL PO) Take 650 mg by mouth in the morning and at bedtime.   Yes [provider]  Acetaminophen (TYLENOL PO) Take 650 mg by mouth every 8 (eight) hours as needed (pain).   Yes [provider]  aspirin 325 MG tablet Take 325 mg by mouth daily. 09/23/20  Yes [provider]  atorvastatin (LIPITOR) 40 MG tablet Take 1 tablet (40 mg total) by mouth at bedtime. Patient taking differently: Take 40 mg by mouth every evening. 09/23/20 10/26/22 Yes Darlin Drop, DO  barrier cream (NON-SPECIFIED) CREA Apply 1 Application topically See admin instructions. 1 application to perineum every shift   Yes [provider]  Biotin 10 MG TABS Take 10 mg by mouth daily.   Yes [provider]  cetirizine (ZYRTEC) 5 MG tablet Take 5 mg by mouth in the morning and at bedtime.   Yes [provider]  clonazePAM (KLONOPIN) 0.5 MG tablet Take 0.5 mg by mouth 2 (two) times daily.   Yes [provider]  conjugated estrogens  (PREMARIN) vaginal cream Place 1 applicator vaginally every Monday, Wednesday, and Friday. At bedtime   Yes [provider]  ethacrynic acid (EDECRIN) 25 MG tablet Take 12.5 mg by mouth daily.   Yes [provider]  furosemide (LASIX) 20 MG tablet Take 20 mg by mouth daily.   Yes [provider]  gabapentin (NEURONTIN) 100 MG capsule Take 2 tablets twice a day for 1 week and then increase to 3 tablets twice a day Patient taking differently: Take 300 mg by mouth in the morning. 08/30/21  Yes Osvaldo Shipper, MD  gabapentin (NEURONTIN) 300 MG capsule Take 300 mg by mouth at bedtime.   Yes [provider]  gabapentin (NEURONTIN) 400 MG capsule Take 400 mg by mouth at bedtime.   Yes [provider]  lidocaine (LIDODERM) 5 % Place 1 patch onto the skin daily. Remove & Discard patch within 12 hours or as directed by MD Patient taking differently: Place 2 patches onto the skin in the morning and at bedtime. Apply one to lower back and left calf 08/30/21  Yes Osvaldo Shipper, MD  lisinopril (PRINIVIL,ZESTRIL) 20 MG tablet Take 20 mg by mouth every morning.   Yes [provider]  methocarbamol (ROBAXIN) 500 MG tablet Take 250 mg by mouth 3 (three) times daily.   Yes [provider]  mirtazapine (REMERON) 15 MG tablet Take 7.5 mg by mouth at bedtime.   Yes [provider]  Multiple Vitamin (MULTIVITAMIN WITH MINERALS) TABS tablet Take 1 tablet by mouth every morning.   Yes [provider]  oxymetazoline (AFRIN) 0.05 % nasal spray Place 2 sprays into both nostrils daily as needed (nose bleeds).   Yes [provider]  pantoprazole (PROTONIX) 20 MG tablet Take 40 mg by mouth daily.   Yes [provider]  Probiotic Product (BACID) CAPS Take 1 capsule by mouth 2 (two) times daily.   Yes [provider]  sertraline (ZOLOFT) 50 MG tablet Take 150 mg by mouth daily.   Yes [provider]  traMADol  (ULTRAM) 50 MG tablet Take 1 tablet (50 mg total) by mouth every 6 (six) hours as needed for moderate pain. 08/30/21  Yes Osvaldo Shipper, MD  trimethoprim (TRIMPEX) 100 MG tablet Take 100 mg by mouth daily.   Yes [provider]    Physical Exam: Vitals:   10/26/22 1315 10/26/22 1330 10/26/22 1352 10/26/22 1500  BP:  (!) 88/30  (!) 136/50  Pulse: (!) 53 (!) 53  (!) 52  Resp: 14 12  16   Temp:   98.3 F (36.8 C)   TempSrc:   Oral   SpO2: 93% 95%  100%  Weight:      Height:        Constitutional: NAD, no pallor or  cyanosis   Eyes: PERTLA, lids and conjunctivae normal ENMT: Mucous membranes are moist. Posterior pharynx clear of any exudate or lesions.   Neck: supple, no masses  Respiratory: no wheezing, no crackles. No accessory muscle use.  Cardiovascular: S1 & S2 heard, regular rate and rhythm. Pretibial pitting edema bilaterally.   Abdomen: No distension, no tenderness, soft. Bowel sounds active.  Musculoskeletal: no clubbing / cyanosis. No joint deformity upper and lower extremities.   Skin: no significant rashes, lesions, ulcers. Warm, dry, well-perfused. Neurologic: CN 2-12 grossly intact. Moving all extremities. Sleeping, wakes to voice.  Psychiatric: Calm. Cooperative.    Labs and Imaging on Admission: I have personally reviewed following labs and imaging studies  CBC: Recent Labs  Lab 10/26/22 1106  WBC 6.1  NEUTROABS 4.3  HGB 10.9*  HCT 34.3*  MCV 99.4  PLT 147*   Basic Metabolic Panel: Recent Labs  Lab 10/26/22 1106  NA 135  K 4.7  CL 100  CO2 28  GLUCOSE 94  BUN 38*  CREATININE 1.35*  CALCIUM 8.6*   GFR: Estimated Creatinine Clearance: 24.4 mL/min (A) (by C-G formula based on SCr of 1.35 mg/dL (H)). Liver Function Tests: Recent Labs  Lab 10/26/22 1106  AST 31  ALT 25  ALKPHOS 95  BILITOT 0.5  PROT 6.3*  ALBUMIN 3.6   No results for input(s): "LIPASE", "AMYLASE" in the last 168 hours. Recent Labs  Lab 10/26/22 1106  AMMONIA 21    Coagulation Profile: No results for input(s): "INR", "PROTIME" in the last 168 hours. Cardiac Enzymes: No results for input(s): "CKTOTAL", "CKMB", "CKMBINDEX", "TROPONINI" in the last 168 hours. BNP (last 3 results) No results for input(s): "PROBNP" in the last 8760 hours. HbA1C: No results for input(s): "HGBA1C" in the last 72 hours. CBG: No results for input(s): "GLUCAP" in the last 168 hours. Lipid Profile: No results for input(s): "CHOL", "HDL", "LDLCALC", "TRIG", "CHOLHDL", "LDLDIRECT" in the last 72 hours. Thyroid Function Tests: Recent Labs    10/26/22 1106  TSH 2.579   Anemia Panel: No results for input(s): "VITAMINB12", "FOLATE", "FERRITIN", "TIBC", "IRON", "RETICCTPCT" in the last 72 hours. Urine analysis:    Component Value Date/Time   COLORURINE YELLOW 08/26/2021 1127   APPEARANCEUR HAZY (A) 08/26/2021 1127   LABSPEC 1.006 08/26/2021 1127   PHURINE 6.0 08/26/2021 1127   GLUCOSEU NEGATIVE 08/26/2021 1127   HGBUR NEGATIVE 08/26/2021 1127   BILIRUBINUR NEGATIVE 08/26/2021 1127   KETONESUR 5 (A) 08/26/2021 1127   PROTEINUR NEGATIVE 08/26/2021 1127   NITRITE POSITIVE (A) 08/26/2021 1127   LEUKOCYTESUR LARGE (A) 08/26/2021 1127   Sepsis Labs: @LABRCNTIP (procalcitonin:4,lacticidven:4) )No results found for this or any previous visit (from the past 240 hour(s)).   Radiological Exams on Admission: CT HEAD WO CONTRAST ( )  Result Date: 10/26/2022 CLINICAL DATA:  89 year old female with lower extremity swelling, recent weight gain, abnormal labs, altered mental status, confusion, fatigue. EXAM: CT HEAD WITHOUT CONTRAST TECHNIQUE: Contiguous axial images were obtained from the base of the skull through the vertex without intravenous contrast. RADIATION DOSE REDUCTION: This exam was performed according to the departmental dose-optimization program which includes automated exposure control, adjustment of the mA and/or kV according to patient size and/or use of iterative  reconstruction technique. COMPARISON:  Head CT 09/26/2021.  Brain MRI 09/22/2020. FINDINGS: Brain: Stable cerebral volume. No midline shift, ventriculomegaly, mass effect, evidence of mass lesion, intracranial hemorrhage or evidence of cortically based acute infarction. Stable patchy, confluent periventricular white matter hypodensity. Vascular: No suspicious intracranial vascular hyperdensity.  Calcified atherosclerosis at the skull base. Skull: Intact, negative. Sinuses/Orbits: Visualized paranasal sinuses and mastoids are stable and well aerated. Other: Negative orbit and scalp soft tissues now. IMPRESSION: Stable non contrast CT appearance of the brain. No acute intracranial abnormality. Electronically Signed   By: Odessa Fleming M.D.   On: 10/26/2022 11:08   DG Chest Portable 1 View  Result Date: 10/26/2022 CLINICAL DATA:  89 year old female with lower extremity swelling. Recent weight gain. Abnormal labs. EXAM: PORTABLE CHEST 1 VIEW COMPARISON:  Chest radiographs 08/26/2021 and earlier. FINDINGS: Portable AP semi upright view at 1019 hours. Lower lung volumes and mildly rotated to the right. Cardiac and mediastinal contours appear stable, mild if any cardiomegaly. Visualized tracheal air column is within normal limits. Chronic right hilar scarring along the right minor fissure is stable. Elsewhere when allowing for portable technique the lungs are clear. No pneumothorax or pleural effusion. No acute osseous abnormality identified.  Paucity of bowel gas. IMPRESSION: Lower lung volumes.  No acute cardiopulmonary abnormality. Electronically Signed   By: Odessa Fleming M.D.   On: 10/26/2022 10:59     Assessment/Plan   1. Acute encephalopathy  - No acute findings on head CT in ED  - Likely related to polypharmacy and AKI  - Hold sedating medications, use delirium precautions    2. AKI  - SCr is 1.35 on admission, up from baseline closer to 0.8  - She is hypervolemic on admission  - Hold trimethoprim, lisinopril,  and ethacrynic acid, monitor with daily chem panel while diuresing   3. Acute on chronic diastolic CHF  - Recent peripheral edema and 10 lb wt gain   - EF was preserved on TTE in September 2022  - Diurese with IV Lasix, monitor weight and I/Os, monitor renal function and electrolytes    4. Depression, anxiety  - Hold sedating medications for now     5. Back pain  - Hold sedating medications for now   6. Hx of TIA  - Continue ASA and Lipitor      DVT prophylaxis: sq heparin  Code Status: DNR  Level of Care: Level of care: Telemetry Family Communication: Daughter at bedside   Disposition Plan:  Patient is from: [facility] d/c is to: [facility] Anticipated d/c date is: 10/29/22  Patient currently: Pending improved mental status, renal function, and volume status Consults called: None  Admission status: Inpatient     Briscoe Deutscher, MD [facility]  10/26/2022, 3:22 PM

## 2022-10-27 ENCOUNTER — Inpatient Hospital Stay (HOSPITAL_COMMUNITY): Payer: Medicare Other

## 2022-10-27 DIAGNOSIS — N179 Acute kidney failure, unspecified: Secondary | ICD-10-CM | POA: Diagnosis not present

## 2022-10-27 LAB — BASIC METABOLIC PANEL
Anion gap: 10 (ref 5–15)
BUN: 42 mg/dL — ABNORMAL HIGH (ref 8–23)
CO2: 31 mmol/L (ref 22–32)
Calcium: 8.9 mg/dL (ref 8.9–10.3)
Chloride: 97 mmol/L — ABNORMAL LOW (ref 98–111)
Creatinine, Ser: 1.43 mg/dL — ABNORMAL HIGH (ref 0.44–1.00)
GFR, Estimated: 35 mL/min — ABNORMAL LOW (ref >60)
Glucose, Bld: 79 mg/dL (ref 70–99)
Potassium: 4.8 mmol/L (ref 3.5–5.1)
Sodium: 138 mmol/L (ref 135–145)

## 2022-10-27 LAB — CBC
HCT: 36.4 % (ref 36.0–46.0)
Hemoglobin: 11.5 g/dL — ABNORMAL LOW (ref 12.0–15.0)
MCH: 31.5 pg (ref 26.0–34.0)
MCHC: 31.6 g/dL (ref 30.0–36.0)
MCV: 99.7 fL (ref 80.0–100.0)
Platelets: 150 10*3/uL (ref 150–400)
RBC: 3.65 MIL/uL — ABNORMAL LOW (ref 3.87–5.11)
RDW: 13.1 % (ref 11.5–15.5)
WBC: 5.1 10*3/uL (ref 4.0–10.5)
nRBC: 0 % (ref 0.0–0.2)

## 2022-10-27 LAB — URINALYSIS, ROUTINE W REFLEX MICROSCOPIC
Bilirubin Urine: NEGATIVE
Glucose, UA: NEGATIVE mg/dL
Hgb urine dipstick: NEGATIVE
Ketones, ur: NEGATIVE mg/dL
Leukocytes,Ua: NEGATIVE
Nitrite: NEGATIVE
Protein, ur: NEGATIVE mg/dL
Specific Gravity, Urine: 1.006 (ref 1.005–1.030)
pH: 5 (ref 5.0–8.0)

## 2022-10-27 LAB — PROTEIN / CREATININE RATIO, URINE
Creatinine, Urine: 27 mg/dL
Total Protein, Urine: <6 mg/dL

## 2022-10-27 LAB — MAGNESIUM: Magnesium: 1.9 mg/dL (ref 1.7–2.4)

## 2022-10-27 MED ORDER — ALUM & MAG HYDROXIDE-SIMETH 200-200-20 MG/5ML PO SUSP
30.0000 mL | ORAL | Status: DC | PRN
  Administered 2022-10-27: 30 mL via ORAL
  Filled 2022-10-27: qty 30

## 2022-10-27 MED ORDER — TRAMADOL HCL 50 MG PO TABS: 50.0000 mg | ORAL_TABLET | Freq: Once | ORAL | Status: DC | PRN

## 2022-10-27 MED ORDER — TRAMADOL HCL 50 MG PO TABS
25.0000 mg | ORAL_TABLET | Freq: Once | ORAL | Status: AC | PRN
  Administered 2022-10-27: 25 mg via ORAL
  Filled 2022-10-27: qty 1

## 2022-10-27 NOTE — Progress Notes (Signed)
PROGRESS NOTE    Samantha Crane  QIH:[id number] DOB: 1929-10-24 DOA: 10/26/2022 PCP: Karna Dupes, MD  Outpatient Specialists:     Brief Narrative:  Patient is a 89 year old Caucasian female with past medical history significant for diastolic dysfunction, depression, anxiety, TIA, recurrent UTI on prophylactic trimethoprim, chronic back pain on mind altering medications.  Patient was admitted with progressive bilateral lower extremity edema, up to the thighs, according to the patient's daughter.  Progressive lower extremity edema started about 2 weeks ago.  Patient has chronic ankle edema.  Patient was started on diuretics at the facility.  Patient may have been on diuretics for the last 48 hours.  Lower extremity edema has improved, however, worsening renal function is noted.  According to the patient's daughter, systolic blood pressure was in the 80s yesterday (at the emergency room), with dropping oxygen saturation.  Prior to admission, patient was on clonazepam, gabapentin, Robaxin, mirtazapine, sertraline and tramadol (all mind altering medications, but currently on hold).  10/27/2022: Patient seen alongside patient's daughter and nurse.  Leg edema seems to have improved, however, serum creatinine has gone from 1.35-1.43, with BUN of 42.  Last renal function done on 08/30/2020 revealed serum creatinine of 0.76.  Will hold IV Lasix.  Last echocardiogram done around 2022 revealed grade 1 diastolic dysfunction, with normal left ventricular ejection fraction.  Chest x-ray has not revealed any acute cardiopulmonary changes.  Cardiac BNP was 153 on admission.  No shortness of breath.  No chest pain.  Labs reviewed today (10/27/2023): Chemistry reveals sodium of 138, potassium of 4.8, chloride of 97, CO2 of 31, BUN of 32, serum creatinine of 1.43 with blood sugar of 79.  Magnesium is 1.9 with estimated GFR of 35 mL/min per 1.73 m.  CBC revealed WBC of 5.1, hemoglobin of 11.5, hematocrit of 36.4,  MCV of 99.7 with platelet count of 150.   Assessment & Plan:   Principal Problem:   AKI (acute kidney injury) (HCC) Active Problems:   Anxiety   Essential hypertension   Personal history of transient ischemic attack (TIA), and cerebral infarction without residual deficits   Depression   Acute encephalopathy   1. Acute encephalopathy: - No acute findings on head CT in ED  - Likely related to polypharmacy and AKI  - Hold sedating medications, use delirium precautions   10/27/2022: Encephalopathy is improving.   2. AKI  - SCr is 1.35 on admission, up from baseline closer to 0.8  -Serum creatinine is 1.43 today. -Acute kidney injury may be multifactorial. -Patient has been on diuretics. -Was noted to be hypotensive yesterday at the emergency room, with reported systolic blood pressure in the 80s. -Hold diuretics for now. -Send urine for urinalysis. -Will hold off on urine sodium for now. -Renal ultrasound. -Avoid nephrotoxins. -Keep MAP greater than 65 mmHg. -Optimize blood pressure control. -Continue to hold trimethoprim.  Send and electrolytes.    3. Acute on chronic diastolic CHF  - Recent peripheral edema and 10 lb wt gain   - EF was preserved on TTE in September 2022  -Bilateral lower extremity edema has improved. -Pursue echocardiogram. -Further management depend on above.      4. Depression, anxiety  - Hold sedating medications for now      5. Back pain  - Hold sedating medications for now    6. Hx of TIA  - Continue ASA and Lipitor     DVT prophylaxis: Subcutaneous heparin. Code Status: DO NOT RESUSCITATE. Family Communication: Patient's daughter. Disposition Plan:  This will depend on hospital course.   Consultants:  None.  Procedures:  Echo ordered.  Antimicrobials:  None.   Subjective: No shortness of breath. No chest pain.  Objective: Vitals:   10/26/22 2040 10/27/22 0141 10/27/22 0500 10/27/22 0529  BP: (!) 106/37 (!) 115/38  98/83   Pulse: (!) 56 (!) 55  (!) 54  Resp: 18   18  Temp: 98 F (36.7 C)   (!) 97.4 F (36.3 C)  TempSrc: Axillary   Oral  SpO2: 100% 97%  97%  Weight:   59.3 kg   Height:        Intake/Output Summary (Last 24 hours) at 10/27/2022 1149 Last data filed at 10/27/2022 0900 Gross per 24 hour  Intake 271 ml  Output 3150 ml  Net -2879 ml   Filed Weights   10/26/22 0927 10/26/22 1650 10/27/22 0500  Weight: 61.2 kg 64.1 kg 59.3 kg    Examination:  General exam: Appears calm and comfortable  Respiratory system: Clear to auscultation.   Cardiovascular system: S1 & S2 heard  Gastrointestinal system: Abdomen is nondistended, soft and nontender.   Central nervous system: Alert and oriented.   Extremities: 1+ bilateral lower extremity edema.  Data Reviewed: I have personally reviewed following labs and imaging studies  CBC: Recent Labs  Lab 10/26/22 1106 10/27/22 0603  WBC 6.1 5.1  NEUTROABS 4.3  --   HGB 10.9* 11.5*  HCT 34.3* 36.4  MCV 99.4 99.7  PLT 147* 150   Basic Metabolic Panel: Recent Labs  Lab 10/26/22 1106 10/27/22 0603  NA 135 138  K 4.7 4.8  CL 100 97*  CO2 28 31  GLUCOSE 94 79  BUN 38* 42*  CREATININE 1.35* 1.43*  CALCIUM 8.6* 8.9  MG  --  1.9   GFR: Estimated Creatinine Clearance: 23.1 mL/min (A) (by C-G formula based on SCr of 1.43 mg/dL (H)). Liver Function Tests: Recent Labs  Lab 10/26/22 1106  AST 31  ALT 25  ALKPHOS 95  BILITOT 0.5  PROT 6.3*  ALBUMIN 3.6   No results for input(s): "LIPASE", "AMYLASE" in the last 168 hours. Recent Labs  Lab 10/26/22 1106  AMMONIA 21   Coagulation Profile: No results for input(s): "INR", "PROTIME" in the last 168 hours. Cardiac Enzymes: No results for input(s): "CKTOTAL", "CKMB", "CKMBINDEX", "TROPONINI" in the last 168 hours. BNP (last 3 results) No results for input(s): "PROBNP" in the last 8760 hours. HbA1C: No results for input(s): "HGBA1C" in the last 72 hours. CBG: No results for input(s):  "GLUCAP" in the last 168 hours. Lipid Profile: No results for input(s): "CHOL", "HDL", "LDLCALC", "TRIG", "CHOLHDL", "LDLDIRECT" in the last 72 hours. Thyroid Function Tests: Recent Labs    10/26/22 1106  TSH 2.579   Anemia Panel: No results for input(s): "VITAMINB12", "FOLATE", "FERRITIN", "TIBC", "IRON", "RETICCTPCT" in the last 72 hours. Urine analysis:    Component Value Date/Time   COLORURINE YELLOW 08/26/2021 1127   APPEARANCEUR HAZY (A) 08/26/2021 1127   LABSPEC 1.006 08/26/2021 1127   PHURINE 6.0 08/26/2021 1127   GLUCOSEU NEGATIVE 08/26/2021 1127   HGBUR NEGATIVE 08/26/2021 1127   BILIRUBINUR NEGATIVE 08/26/2021 1127   KETONESUR 5 (A) 08/26/2021 1127   PROTEINUR NEGATIVE 08/26/2021 1127   NITRITE POSITIVE (A) 08/26/2021 1127   LEUKOCYTESUR LARGE (A) 08/26/2021 1127   Sepsis Labs: @LABRCNTIP (procalcitonin:4,lacticidven:4)  )No results found for this or any previous visit (from the past 240 hour(s)).       Radiology Studies: CT HEAD WO  CONTRAST ( )  Result Date: 10/26/2022 CLINICAL DATA:  89 year old female with lower extremity swelling, recent weight gain, abnormal labs, altered mental status, confusion, fatigue. EXAM: CT HEAD WITHOUT CONTRAST TECHNIQUE: Contiguous axial images were obtained from the base of the skull through the vertex without intravenous contrast. RADIATION DOSE REDUCTION: This exam was performed according to the departmental dose-optimization program which includes automated exposure control, adjustment of the mA and/or kV according to patient size and/or use of iterative reconstruction technique. COMPARISON:  Head CT 09/26/2021.  Brain MRI 09/22/2020. FINDINGS: Brain: Stable cerebral volume. No midline shift, ventriculomegaly, mass effect, evidence of mass lesion, intracranial hemorrhage or evidence of cortically based acute infarction. Stable patchy, confluent periventricular white matter hypodensity. Vascular: No suspicious intracranial vascular  hyperdensity. Calcified atherosclerosis at the skull base. Skull: Intact, negative. Sinuses/Orbits: Visualized paranasal sinuses and mastoids are stable and well aerated. Other: Negative orbit and scalp soft tissues now. IMPRESSION: Stable non contrast CT appearance of the brain. No acute intracranial abnormality. Electronically Signed   By: Odessa Fleming M.D.   On: 10/26/2022 11:08   DG Chest Portable 1 View  Result Date: 10/26/2022 CLINICAL DATA:  89 year old female with lower extremity swelling. Recent weight gain. Abnormal labs. EXAM: PORTABLE CHEST 1 VIEW COMPARISON:  Chest radiographs 08/26/2021 and earlier. FINDINGS: Portable AP semi upright view at 1019 hours. Lower lung volumes and mildly rotated to the right. Cardiac and mediastinal contours appear stable, mild if any cardiomegaly. Visualized tracheal air column is within normal limits. Chronic right hilar scarring along the right minor fissure is stable. Elsewhere when allowing for portable technique the lungs are clear. No pneumothorax or pleural effusion. No acute osseous abnormality identified.  Paucity of bowel gas. IMPRESSION: Lower lung volumes.  No acute cardiopulmonary abnormality. Electronically Signed   By: Odessa Fleming M.D.   On: 10/26/2022 10:59        Scheduled Meds:  aspirin  325 mg Oral Daily   atorvastatin  40 mg Oral QPM   furosemide  40 mg Intravenous Q12H   heparin  5,000 Units Subcutaneous Q8H   lidocaine  1 patch Transdermal Q24H   pantoprazole  40 mg Oral Daily   sodium chloride flush  3 mL Intravenous Q12H   Continuous Infusions:   LOS: 1 day    Time spent: 35 minutes.    Berton Mount, MD  [facility] Pager #: [phone] 7PM-7AM contact night coverage as above

## 2022-10-27 NOTE — Progress Notes (Signed)
Rapid Response Event Note   Reason for Call : pt having chest pain    Initial Focused Assessment: arrived to pt lying in bed.  A/O, and able to follow commands.  rating chest pain 4/10.  Pt states she has had this issue before.  Primary RN administering Maalox to pt.  See flowsheet VS.   Interventions: EKG complete, [facility], NP made aware, new orders received and imitated.  Pt now denies chest pain.   Plan of Care: Pt remains in current location per [facility], NP.     Conley Rolls, RN

## 2022-10-27 NOTE — Progress Notes (Signed)
Rapid response RN called around 0145 for patient c/o chest pain. Patient on 2L O2 already, VS obtained, which seem normal compared to patients baseline, see flowsheets, EKG obtained. NP at bedside, reviewed EKG, pain meds ordered. Patient given pain meds and maalox. Patient believes this could be indigestion as she has had this sort of pain before a couple weeks ago and the pain went away on its own. Patient pain resolved after receiving medications, no longer c/o any pain.

## 2022-10-28 ENCOUNTER — Other Ambulatory Visit (HOSPITAL_COMMUNITY): Payer: Medicare Other

## 2022-10-28 DIAGNOSIS — R600 Localized edema: Secondary | ICD-10-CM

## 2022-10-28 DIAGNOSIS — N179 Acute kidney failure, unspecified: Secondary | ICD-10-CM | POA: Diagnosis not present

## 2022-10-28 DIAGNOSIS — I5031 Acute diastolic (congestive) heart failure: Secondary | ICD-10-CM

## 2022-10-28 LAB — ECHOCARDIOGRAM COMPLETE
AR max vel: 2.63 cm2
AV Area VTI: 2.63 cm2
AV Area mean vel: 2.95 cm2
AV Mean grad: 4 mm[Hg]
AV Peak grad: 7.7 mm[Hg]
Ao pk vel: 1.39 m/s
Area-P 1/2: 2.29 cm2
Height: 65 in
S' Lateral: 2.3 cm
Weight: 2091.72 [oz_av]

## 2022-10-28 LAB — RENAL FUNCTION PANEL
Albumin: 3.6 g/dL (ref 3.5–5.0)
Anion gap: 13 (ref 5–15)
BUN: 46 mg/dL — ABNORMAL HIGH (ref 8–23)
CO2: 27 mmol/L (ref 22–32)
Calcium: 8.9 mg/dL (ref 8.9–10.3)
Chloride: 97 mmol/L — ABNORMAL LOW (ref 98–111)
Creatinine, Ser: 1.6 mg/dL — ABNORMAL HIGH (ref 0.44–1.00)
GFR, Estimated: 30 mL/min — ABNORMAL LOW (ref >60)
Glucose, Bld: 90 mg/dL (ref 70–99)
Phosphorus: 4.4 mg/dL (ref 2.5–4.6)
Potassium: 5.4 mmol/L — ABNORMAL HIGH (ref 3.5–5.1)
Sodium: 137 mmol/L (ref 135–145)

## 2022-10-28 LAB — BASIC METABOLIC PANEL
Anion gap: 10 (ref 5–15)
BUN: 44 mg/dL — ABNORMAL HIGH (ref 8–23)
CO2: 28 mmol/L (ref 22–32)
Calcium: 8.8 mg/dL — ABNORMAL LOW (ref 8.9–10.3)
Chloride: 97 mmol/L — ABNORMAL LOW (ref 98–111)
Creatinine, Ser: 1.63 mg/dL — ABNORMAL HIGH (ref 0.44–1.00)
GFR, Estimated: 30 mL/min — ABNORMAL LOW (ref >60)
Glucose, Bld: 92 mg/dL (ref 70–99)
Potassium: 5.3 mmol/L — ABNORMAL HIGH (ref 3.5–5.1)
Sodium: 135 mmol/L (ref 135–145)

## 2022-10-28 LAB — CBC WITH DIFFERENTIAL/PLATELET
Abs Immature Granulocytes: 0.02 10*3/uL (ref 0.00–0.07)
Basophils Absolute: 0 10*3/uL (ref 0.0–0.1)
Basophils Relative: 1 %
Eosinophils Absolute: 0.1 10*3/uL (ref 0.0–0.5)
Eosinophils Relative: 2 %
HCT: 40.8 % (ref 36.0–46.0)
Hemoglobin: 12.6 g/dL (ref 12.0–15.0)
Immature Granulocytes: 0 %
Lymphocytes Relative: 26 %
Lymphs Abs: 1.6 10*3/uL (ref 0.7–4.0)
MCH: 31.4 pg (ref 26.0–34.0)
MCHC: 30.9 g/dL (ref 30.0–36.0)
MCV: 101.7 fL — ABNORMAL HIGH (ref 80.0–100.0)
Monocytes Absolute: 0.8 10*3/uL (ref 0.1–1.0)
Monocytes Relative: 12 %
Neutro Abs: 3.8 10*3/uL (ref 1.7–7.7)
Neutrophils Relative %: 59 %
Platelets: 150 10*3/uL (ref 150–400)
RBC: 4.01 MIL/uL (ref 3.87–5.11)
RDW: 13.2 % (ref 11.5–15.5)
WBC: 6.3 10*3/uL (ref 4.0–10.5)
nRBC: 0 % (ref 0.0–0.2)

## 2022-10-28 LAB — MAGNESIUM: Magnesium: 2 mg/dL (ref 1.7–2.4)

## 2022-10-28 MED ORDER — SERTRALINE HCL 50 MG PO TABS
50.0000 mg | ORAL_TABLET | Freq: Every day | ORAL | Status: DC
  Administered 2022-10-28 – 2022-10-30 (×3): 50 mg via ORAL
  Filled 2022-10-28 (×3): qty 1

## 2022-10-28 MED ORDER — GABAPENTIN 100 MG PO CAPS
200.0000 mg | ORAL_CAPSULE | Freq: Two times a day (BID) | ORAL | Status: DC
  Administered 2022-10-28 – 2022-10-30 (×5): 200 mg via ORAL
  Filled 2022-10-28 (×5): qty 2

## 2022-10-28 MED ORDER — CLONAZEPAM 0.5 MG PO TABS
0.5000 mg | ORAL_TABLET | Freq: Two times a day (BID) | ORAL | Status: DC
  Administered 2022-10-28 – 2022-10-30 (×6): 0.5 mg via ORAL
  Filled 2022-10-28 (×6): qty 1

## 2022-10-28 NOTE — Progress Notes (Signed)
PROGRESS NOTE    Riyan Fadness  ZOX:[id number] DOB: Dec 25, 1929 DOA: 10/26/2022 PCP: Karna Dupes, MD    Brief Narrative:  Patient is a 89 year old Caucasian female with past medical history significant for diastolic dysfunction, depression, anxiety, TIA, recurrent UTI on prophylactic trimethoprim, chronic back pain on multiple medications.  Patient lives at [facility], long-term care nursing home.  Patient was admitted with progressive bilateral lower extremity edema, up to the thighs since about 2 weeks.  She also had 15 pound weight gain.  Patient was started on diuretics at the facility.  Patient may have been on diuretics for the last 48 hours.  Lower extremity edema has improved, however, worsening renal function is noted.  Patient also noted to be more lethargic and tired.  Prior to admission, patient was on clonazepam, gabapentin, Robaxin, mirtazapine, sertraline and tramadol.   Treated symptomatically, diuretics were held.  Medications were held.   Assessment & Plan:   Acute metabolic encephalopathy, this is likely polypharmacy related to AKI. Mental status somehow improving.  She does not have any focal deficits.  Head CT was normal. See medication management below.  AKI, recent known baseline creatinine 0.8.  Serum creatinine 1.35-1.43-1.6.  This is likely multifactorial, hypotension and diuretics.  Blood pressures are adequate now.  Discontinuing diuretics.  Will avoid giving IV fluids due to fluid overload.  Renal ultrasound is essentially normal.  No evidence of heart nephrosis or urinary retention.  Recheck tomorrow morning.  Acute on chronic diastolic congestive heart failure: Recently treated for peripheral edema and 15 pound weight gain.  Previous echocardiogram with diastolic dysfunction, normal ejection fraction.  2 years since last echo.  Repeating today.  Holding diuretics.  Anxiety and depression, chronic back pain and debility: Patient at home on clonazepam 0.5  mg twice daily, continued Gabapentin 300 mg twice daily, start on 200 mg twice daily Robaxin, discontinued Tramadol, discontinued Sertraline 150 mg daily at home, will start 50 mg daily Remeron, continue.  Will continue supportive therapies.  Work with PT OT today.  May need to add pain medications, will evaluate with mobility.  History of TIA: On aspirin and Lipitor.  No new neurological deficits.    DVT prophylaxis: heparin injection 5,000 Units Start: 10/26/22 1530   Code Status: DNR with limited intervention Family Communication: Patient's daughter at the bedside Disposition Plan: Status is: Inpatient Remains inpatient appropriate because: Significant abnormal renal functions, medication management     Consultants:  None  Procedures:  None  Antimicrobials:  None   Subjective: Patient seen and examined in the morning rounds.  Her daughter was at the bedside.  Patient herself is poor historian.  She tells me she never felt well for last few years.  Patient tells me she is more awake but does not know what brought her to the hospital.  No other overnight events.  According to the patient's daughter, she is somehow waking up, however still is more sleepy and lethargic than her usual self.  We discussed about medication management, introducing her long-term medications one by one.  Objective: Vitals:   10/27/22 1502 10/27/22 2142 10/28/22 0635 10/28/22 1046  BP: (!) 117/38 133/62 134/60 124/71  Pulse: 66 62 (!) 54 (!) 52  Resp: 16 18 18 16   Temp: 98.4 F (36.9 C) 97.8 F (36.6 C) (!) 97.3 F (36.3 C)   TempSrc: Oral Oral Oral   SpO2: (!) 89% 93% 100%   Weight:      Height:  Intake/Output Summary (Last 24 hours) at 10/28/2022 1253 Last data filed at 10/28/2022 0630 Gross per 24 hour  Intake 460 ml  Output 1000 ml  Net -540 ml   Filed Weights   10/26/22 0927 10/26/22 1650 10/27/22 0500  Weight: 61.2 kg 64.1 kg 59.3 kg    Examination:  General exam:  Appears calm and comfortable  Frail and debilitated.  Chronically sick looking.  She is not in any distress.  She is on 1.5 L of oxygen.  She can keep up conversation, however drifts back to sleep or being quiet. Respiratory system: No added sounds. Cardiovascular system: S1 & S2 heard, RRR.  Trace bilateral edema. Gastrointestinal system: Abdomen is nondistended, soft and nontender. No organomegaly or masses felt. Normal bowel sounds heard.   Data Reviewed: I have personally reviewed following labs and imaging studies  CBC: Recent Labs  Lab 10/26/22 1106 10/27/22 0603 10/28/22 0536  WBC 6.1 5.1 6.3  NEUTROABS 4.3  --  3.8  HGB 10.9* 11.5* 12.6  HCT 34.3* 36.4 40.8  MCV 99.4 99.7 101.7*  PLT 147* 150 150   Basic Metabolic Panel: Recent Labs  Lab 10/26/22 1106 10/27/22 0603 10/28/22 0536 10/28/22 0537  NA 135 138 135 137  K 4.7 4.8 5.3* 5.4*  CL 100 97* 97* 97*  CO2 28 31 28 27   GLUCOSE 94 79 92 90  BUN 38* 42* 44* 46*  CREATININE 1.35* 1.43* 1.63* 1.60*  CALCIUM 8.6* 8.9 8.8* 8.9  MG  --  1.9 2.0  --   PHOS  --   --   --  4.4   GFR: Estimated Creatinine Clearance: 20.6 mL/min (A) (by C-G formula based on SCr of 1.6 mg/dL (H)). Liver Function Tests: Recent Labs  Lab 10/26/22 1106 10/28/22 0537  AST 31  --   ALT 25  --   ALKPHOS 95  --   BILITOT 0.5  --   PROT 6.3*  --   ALBUMIN 3.6 3.6   No results for input(s): "LIPASE", "AMYLASE" in the last 168 hours. Recent Labs  Lab 10/26/22 1106  AMMONIA 21   Coagulation Profile: No results for input(s): "INR", "PROTIME" in the last 168 hours. Cardiac Enzymes: No results for input(s): "CKTOTAL", "CKMB", "CKMBINDEX", "TROPONINI" in the last 168 hours. BNP (last 3 results) No results for input(s): "PROBNP" in the last 8760 hours. HbA1C: No results for input(s): "HGBA1C" in the last 72 hours. CBG: No results for input(s): "GLUCAP" in the last 168 hours. Lipid Profile: No results for input(s): "CHOL", "HDL",  "LDLCALC", "TRIG", "CHOLHDL", "LDLDIRECT" in the last 72 hours. Thyroid Function Tests: Recent Labs    10/26/22 1106  TSH 2.579   Anemia Panel: No results for input(s): "VITAMINB12", "FOLATE", "FERRITIN", "TIBC", "IRON", "RETICCTPCT" in the last 72 hours. Sepsis Labs: Recent Labs  Lab 10/26/22 1106  LATICACIDVEN 1.1    No results found for this or any previous visit (from the past 240 hour(s)).       Radiology Studies: US RENAL  Result Date: 10/27/2022 CLINICAL DATA:  Acute renal insufficiency. EXAM: RENAL / URINARY TRACT ULTRASOUND COMPLETE COMPARISON:  None Available. FINDINGS: Evaluation is limited due to body habitus and overlying bowel gas. Right Kidney: Renal measurements: 8.4 x 4.1 x 4.4 cm = volume: 79 mL. Normal echogenicity. No hydronephrosis or shadowing stone. Left Kidney: Renal measurements: 7.4 x 3.8 x 4.3 cm = volume: 63 ML. Normal echogenicity. No hydronephrosis or shadowing stone. Bladder: The urinary bladder is partially distended. Bilateral ureteral  jets noted. A 1.0 x 1.1 x 0.5 cm cystic area along the left lateral bladder wall is not evaluated but may represent a diverticulum or less likely a ureterocele. Cystography may provide better evaluation. Other: None. IMPRESSION: 1. No hydronephrosis or shadowing stone. 2. Indeterminate cystic structure along the left bladder wall. Electronically Signed   By: Elgie Collard M.D.   On: 10/27/2022 18:03        Scheduled Meds:  aspirin  325 mg Oral Daily   atorvastatin  40 mg Oral QPM   clonazePAM  0.5 mg Oral BID   gabapentin  200 mg Oral BID   heparin  5,000 Units Subcutaneous Q8H   lidocaine  1 patch Transdermal Q24H   pantoprazole  40 mg Oral Daily   sertraline  50 mg Oral Daily   sodium chloride flush  3 mL Intravenous Q12H   Continuous Infusions:   LOS: 2 days    Time spent: 40 minutes    Dorcas Carrow, MD [facility]

## 2022-10-28 NOTE — Progress Notes (Signed)
Heart Failure Navigator Progress Note  Assessed for Heart & Vascular TOC clinic readiness.  Patient does not meet criteria due to EF 70-75%, Pt with Functional Limitations per MD note.   Navigator available for reassessment of patient.   Roxy Horseman, RN, BSN [facility] Heart Failure Navigator Secure Chat Only

## 2022-10-28 NOTE — Evaluation (Signed)
Physical Therapy Evaluation Patient Details Name: Samantha Crane MRN: [id number] DOB: 08/29/1929 Today's Date: 10/28/2022  History of Present Illness  Patient is a 89 year old female who was admitted with BLE edema, increased lethargy and fatigue. Patient was admitted with acute metabolic encephalopathy, AKI, acute on chronic diastolic CHF, PMH: anxiety, depression, chronic pain, TIA, back precautions.  Clinical Impression  Pt admitted with above diagnosis.  Pt currently with functional limitations due to the deficits listed below (see PT Problem List). Pt will benefit from acute skilled PT to increase their independence and safety with mobility to allow discharge.   The patient is very lethargic , Patient required  max assistance  of 2  persons to sit up and stand at RW, able to take small side steps. Patient demonstrated poor posture, head leaning forward.  Patient reports that she was  able to assist self with dressing and ambulation  PTA.  Patient will benefit from continued inpatient follow up therapy, <3 hours/day.          If plan is discharge home, recommend the following: Two people to help with walking and/or transfers;A lot of help with bathing/dressing/bathroom;Assistance with cooking/housework;Assist for transportation   Can travel by private vehicle   No    Equipment Recommendations None recommended by PT  Recommendations for Other Services       Functional Status Assessment Patient has had a recent decline in their functional status and demonstrates the ability to make significant improvements in function in a reasonable and predictable amount of time.     Precautions / Restrictions Precautions Precautions: Back Precaution Comments: h/o vertebral fxs Restrictions Weight Bearing Restrictions: No      Mobility  Bed Mobility Overal bed mobility: Needs Assistance Bed Mobility: Supine to Sit, Sit to Supine     Supine to sit: Max assist, +2 for physical assistance,  +2 for safety/equipment Sit to supine: Max assist, +2 for safety/equipment, +2 for physical assistance        Transfers Overall transfer level: Needs assistance Equipment used: Rolling walker (2 wheels) Transfers: Sit to/from Stand Sit to Stand: Mod assist, +2 physical assistance, +2 safety/equipment           General transfer comment: + 2 max assist  to rise from bed, able to sidesteps x 4  along bed, head forward flexed, very weak effort.    Ambulation/Gait                  Stairs            Wheelchair Mobility     Tilt Bed    Modified Rankin (Stroke Patients Only)       Balance Overall balance assessment: Needs assistance Sitting-balance support: Feet supported, Bilateral upper extremity supported Sitting balance-Leahy Scale: Poor Sitting balance - Comments: head forward flexed, cues to attempt to sit upright, patient very weak.                                     Pertinent Vitals/Pain Pain Assessment Pain Assessment: No/denies pain    Home Living Family/patient expects to be discharged to:: Assisted living                 Home Equipment: Rollator (4 wheels) Additional Comments: patient lives at [facility]    Prior Function Prior Level of Function : Needs assist  ADLs Comments: patient reported that is is normally able to do things for herself with walker.     Extremity/Trunk Assessment   Upper Extremity Assessment Upper Extremity Assessment: Defer to OT evaluation    Lower Extremity Assessment Lower Extremity Assessment: Generalized weakness    Cervical / Trunk Assessment Cervical / Trunk Assessment: Kyphotic  Communication   Communication Communication: No apparent difficulties Cueing Techniques: Verbal cues;Gestural cues  Cognition Arousal: Lethargic Behavior During Therapy: Flat affect Overall Cognitive Status: Difficult to assess                                  General Comments: patient was fatigued during session.patient when asked where she was did not say anything. patient was educated on location. she reported " i know" after education.        General Comments      Exercises     Assessment/Plan    PT Assessment Patient needs continued PT services  PT Problem List Decreased strength;Decreased activity tolerance;Decreased mobility;Decreased cognition;Decreased safety awareness;Decreased balance;Decreased knowledge of use of DME       PT Treatment Interventions DME instruction;Therapeutic activities;Gait training;Functional mobility training;Therapeutic exercise;Patient/family education    PT Goals (Current goals can be found in the Care Plan section)  Acute Rehab PT Goals Patient Stated Goal: states to care for self PT Goal Formulation: With patient Time For Goal Achievement: 11/11/22 Potential to Achieve Goals: Fair    Frequency Min 1X/week     Co-evaluation PT/OT/SLP Co-Evaluation/Treatment: Yes Reason for Co-Treatment: To address functional/ADL transfers;For patient/therapist safety PT goals addressed during session: Mobility/safety with mobility OT goals addressed during session: ADL's and self-care       AM-PAC PT "6 Clicks" Mobility  Outcome Measure Help needed turning from your back to your side while in a flat bed without using bedrails?: A Lot Help needed moving from lying on your back to sitting on the side of a flat bed without using bedrails?: A Lot Help needed moving to and from a bed to a chair (including a wheelchair)?: Total Help needed standing up from a chair using your arms (e.g., wheelchair or bedside chair)?: Total Help needed to walk in hospital room?: Total Help needed climbing 3-5 steps with a railing? : Total 6 Click Score: 8    End of Session Equipment Utilized During Treatment: Gait belt Activity Tolerance: Patient limited by fatigue;Patient limited by lethargy Patient left: in bed;with call  bell/phone within reach;with bed alarm set Nurse Communication: Mobility status PT Visit Diagnosis: Unsteadiness on feet (R26.81);Muscle weakness (generalized) (M62.81);Difficulty in walking, not elsewhere classified (R26.2)    Time: 4401-0272 PT Time Calculation (min) (ACUTE ONLY): 18 min   Charges:   PT Evaluation $PT Eval Low Complexity: 1 Low   PT General Charges $$ ACUTE PT VISIT: 1 Visit         Blanchard Kelch PT Acute Rehabilitation Services Office [phone] Weekend [phone]   Rada Hay 10/28/2022, 1:33 PM

## 2022-10-28 NOTE — Evaluation (Signed)
Occupational Therapy Evaluation Patient Details Name: Samantha Crane MRN: [id number] DOB: [birth year] Today's Date: 10/28/2022   History of Present Illness Patient is a 89 year old female who was admitted with BLE edema, increased lethargy and fatigue. Patient was admitted with acute metabolic encephalopathy, AKI, acute on chronic diastolic CHF, PMH: anxiety, depression, chronic pain, TIA, back precautions.   Clinical Impression   Patient 89 year old female who was admitted for above. Patient was living at ALF with some support from caregivers for showers. Patient was +2 for bed mobility and attempting transfer to recliner with patient returned to bed. Patient was lethargic with patient a woman of few words. Patient kept repeating "when did I get so weak". Patient was noted to have decreased functional activity tolerance, decreased endurance, decreased standing balance, decreased safety awareness, and decreased knowledge of AD/AE impacting participation in ADLs. Patient will benefit from continued inpatient follow up therapy, <3 hours/day       If plan is discharge home, recommend the following: Two people to help with walking and/or transfers;A lot of help with bathing/dressing/bathroom;Assistance with cooking/housework;Direct supervision/assist for medications management;Assist for transportation;Help with stairs or ramp for entrance;Direct supervision/assist for financial management    Functional Status Assessment  Patient has had a recent decline in their functional status and demonstrates the ability to make significant improvements in function in a reasonable and predictable amount of time.  Equipment Recommendations  None recommended by OT       Precautions / Restrictions Precautions Precautions: Back Precaution Comments: h/o vertebral fxs Restrictions Weight Bearing Restrictions: No      Mobility Bed Mobility Overal bed mobility: Needs Assistance Bed Mobility: Supine to Sit,  Sit to Supine     Supine to sit: Max assist, +2 for physical assistance, +2 for safety/equipment Sit to supine: Max assist, +2 for safety/equipment, +2 for physical assistance              Balance Overall balance assessment: Needs assistance Sitting-balance support: Feet supported, Bilateral upper extremity supported Sitting balance-Leahy Scale: Poor     Standing balance support: Reliant on assistive device for balance, Bilateral upper extremity supported Standing balance-Leahy Scale: Poor         ADL either performed or assessed with clinical judgement   ADL Overall ADL's : Needs assistance/impaired   Eating/Feeding Details (indicate cue type and reason): too lethargic to assess Grooming: Total assistance;Sitting   Upper Body Bathing: Bed level;Maximal assistance   Lower Body Bathing: Bed level;Total assistance   Upper Body Dressing : Bed level;Maximal assistance   Lower Body Dressing: Bed level;Total assistance   Toilet Transfer: +2 for safety/equipment;+2 for physical assistance;Moderate assistance Toilet Transfer Details (indicate cue type and reason): patient was +2 for taking steps on EOB with increased time. Toileting- Clothing Manipulation and Hygiene: Total assistance;Sit to/from stand               Vision   Additional Comments: patient noted to keep eyes closed during session with increased drousiness.            Pertinent Vitals/Pain Pain Assessment Pain Assessment: No/denies pain     Extremity/Trunk Assessment Upper Extremity Assessment Upper Extremity Assessment: Difficult to assess due to impaired cognition   Lower Extremity Assessment Lower Extremity Assessment: Defer to PT evaluation   Cervical / Trunk Assessment Cervical / Trunk Assessment: Kyphotic      Cognition Arousal: Lethargic Behavior During Therapy: Flat affect Overall Cognitive Status: Difficult to assess       General  Comments: patient was fatigued during  session.patient when asked where she was did not say anything. patient was educated on location. she reported " i know" after education.                Home Living Family/patient expects to be discharged to:: Assisted living       Home Equipment: Rollator (4 wheels)   Additional Comments: patient lives at [facility]      Prior Functioning/Environment Prior Level of Function : Needs assist               ADLs Comments: patient reported that is is normally able to do things for herself with walker.        OT Problem List: Decreased activity tolerance;Impaired balance (sitting and/or standing);Decreased coordination;Decreased knowledge of precautions;Decreased safety awareness;Decreased knowledge of use of DME or AE;Cardiopulmonary status limiting activity;Impaired UE functional use      OT Treatment/Interventions: Self-care/ADL training;Therapeutic exercise;DME and/or AE instruction;Therapeutic activities;Patient/family education;Balance training    OT Goals(Current goals can be found in the care plan section) Acute Rehab OT Goals Patient Stated Goal: none stated OT Goal Formulation: Patient unable to participate in goal setting Time For Goal Achievement: 11/11/22 Potential to Achieve Goals: Fair  OT Frequency: Min 1X/week    Co-evaluation PT/OT/SLP Co-Evaluation/Treatment: Yes Reason for Co-Treatment: To address functional/ADL transfers;For patient/therapist safety PT goals addressed during session: Mobility/safety with mobility OT goals addressed during session: ADL's and self-care      AM-PAC OT "6 Clicks" Daily Activity     Outcome Measure Help from another person eating meals?: A Lot Help from another person taking care of personal grooming?: A Lot Help from another person toileting, which includes using toliet, bedpan, or urinal?: Total Help from another person bathing (including washing, rinsing, drying)?: Total Help from another person to put on and  taking off regular upper body clothing?: Total Help from another person to put on and taking off regular lower body clothing?: Total 6 Click Score: 8   End of Session Equipment Utilized During Treatment: Gait belt;Rolling walker (2 wheels) Nurse Communication: Mobility status  Activity Tolerance: Patient tolerated treatment well Patient left: in bed;with call bell/phone within reach;with bed alarm set  OT Visit Diagnosis: Unsteadiness on feet (R26.81);Other abnormalities of gait and mobility (R26.89);Muscle weakness (generalized) (M62.81)                Time: 1014-1030 OT Time Calculation (min): 16 min Charges:  OT General Charges $OT Visit: 1 Visit OT Evaluation $OT Eval Low Complexity: 1 Low  [place] Dube OTR/L, MS Acute Rehabilitation Department Office# [phone]   Selinda Flavin 10/28/2022, 1:09 PM

## 2022-10-29 DIAGNOSIS — N179 Acute kidney failure, unspecified: Secondary | ICD-10-CM | POA: Diagnosis not present

## 2022-10-29 LAB — BASIC METABOLIC PANEL
Anion gap: 10 (ref 5–15)
BUN: 44 mg/dL — ABNORMAL HIGH (ref 8–23)
CO2: 30 mmol/L (ref 22–32)
Calcium: 9.3 mg/dL (ref 8.9–10.3)
Chloride: 98 mmol/L (ref 98–111)
Creatinine, Ser: 1.37 mg/dL — ABNORMAL HIGH (ref 0.44–1.00)
GFR, Estimated: 36 mL/min — ABNORMAL LOW (ref >60)
Glucose, Bld: 104 mg/dL — ABNORMAL HIGH (ref 70–99)
Potassium: 4.6 mmol/L (ref 3.5–5.1)
Sodium: 138 mmol/L (ref 135–145)

## 2022-10-29 LAB — CBC
HCT: 38.5 % (ref 36.0–46.0)
Hemoglobin: 12.2 g/dL (ref 12.0–15.0)
MCH: 31.4 pg (ref 26.0–34.0)
MCHC: 31.7 g/dL (ref 30.0–36.0)
MCV: 99.2 fL (ref 80.0–100.0)
Platelets: 145 10*3/uL — ABNORMAL LOW (ref 150–400)
RBC: 3.88 MIL/uL (ref 3.87–5.11)
RDW: 13.2 % (ref 11.5–15.5)
WBC: 6.9 10*3/uL (ref 4.0–10.5)
nRBC: 0 % (ref 0.0–0.2)

## 2022-10-29 LAB — SODIUM, URINE, RANDOM: Sodium, Ur: 67 mmol/L

## 2022-10-29 LAB — CREATININE, URINE, RANDOM: Creatinine, Urine: 69 mg/dL

## 2022-10-29 MED ORDER — OXYMETAZOLINE HCL 0.05 % NA SOLN
2.0000 | NASAL | Status: AC | PRN
  Administered 2022-10-29 (×2): 2 via NASAL
  Filled 2022-10-29: qty 15

## 2022-10-29 NOTE — Progress Notes (Signed)
Last 24 hours) at 10/29/2022 1156 Last data filed at 10/29/2022 1003 Gross per 24 hour  Intake 413 ml  Output --  Net 413 ml   Filed Weights   10/26/22 1650 10/27/22 0500 10/29/22 0500  Weight: 64.1 kg 59.3 kg 60.4 kg    Examination:  General exam: Appears calm and comfortable . Debilitated but comfortable today. She is not in any distress.    Patient is on  room air.  Pleasant to conversation. Respiratory system: No added sounds. Cardiovascular system: S1 & S2 heard, RRR.    No edema. Gastrointestinal system: Abdomen is nondistended, soft and nontender. No organomegaly or masses felt. Normal bowel sounds heard.   Data Reviewed: I have personally reviewed following labs and imaging studies  CBC: Recent Labs  Lab 10/26/22 1106 10/27/22 0603 10/28/22 0536 10/29/22 0548  WBC 6.1 5.1 6.3 6.9  NEUTROABS 4.3  --  3.8  --   HGB 10.9* 11.5* 12.6 12.2  HCT 34.3* 36.4 40.8 38.5  MCV 99.4 99.7 101.7* 99.2  PLT 147* 150 150 145*   Basic Metabolic Panel: Recent Labs  Lab 10/26/22 1106 10/27/22 0603 10/28/22 0536 10/28/22 0537 10/29/22 0548  NA 135 138 135 137 138  K 4.7 4.8 5.3* 5.4* 4.6  CL 100 97* 97* 97* 98  CO2 28 31 28 27 30   GLUCOSE 94 79 92 90 104*  BUN 38* 42* 44* 46* 44*  CREATININE 1.35* 1.43* 1.63* 1.60* 1.37*  CALCIUM 8.6* 8.9 8.8* 8.9 9.3  MG  --  1.9 2.0  --   --   PHOS  --   --   --  4.4  --    GFR: Estimated Creatinine Clearance: 24.1 mL/min (A) (by C-G formula based on SCr of 1.37 mg/dL (H)). Liver Function Tests: Recent Labs  Lab 10/26/22 1106 10/28/22 0537  AST 31  --   ALT 25  --   ALKPHOS 95  --   BILITOT 0.5  --   PROT 6.3*  --   ALBUMIN 3.6 3.6   No results for input(s): "LIPASE", "AMYLASE" in the last 168 hours. Recent Labs  Lab 10/26/22 1106  AMMONIA 21   Coagulation Profile: No results for input(s): "INR", "PROTIME" in the last 168 hours. Cardiac Enzymes: No results for input(s): "CKTOTAL", "CKMB", "CKMBINDEX", "TROPONINI" in the last 168 hours. BNP (last 3 results) No results for input(s): "PROBNP" in the last 8760 hours. HbA1C: No results for input(s): "HGBA1C" in the last 72 hours. CBG: No results for input(s): "GLUCAP" in the last 168 hours. Lipid Profile: No results for input(s): "CHOL", "HDL", "LDLCALC", "TRIG", "CHOLHDL", "LDLDIRECT" in the last 72 hours. Thyroid Function  Tests: No results for input(s): "TSH", "T4TOTAL", "FREET4", "T3FREE", "THYROIDAB" in the last 72 hours.  Anemia Panel: No results for input(s): "VITAMINB12", "FOLATE", "FERRITIN", "TIBC", "IRON", "RETICCTPCT" in the last 72 hours. Sepsis Labs: Recent Labs  Lab 10/26/22 1106  LATICACIDVEN 1.1    No results found for this or any previous visit (from the past 240 hour(s)).       Radiology Studies: ECHOCARDIOGRAM COMPLETE  Result Date: 10/28/2022    ECHOCARDIOGRAM REPORT   Patient Name:   Samantha Crane Date of Exam: 10/28/2022 Medical Rec #:  [id number]    Height:       65.0 in Accession #:    [id number]   Weight:       130.7 lb Date of Birth:  [birth year]    BSA:  PROGRESS NOTE    Samantha Crane  ZOX:[id number] DOB: 07-20-1929 DOA: 10/26/2022 PCP: Karna Dupes, MD    Brief Narrative:  Patient is a 89 year old Caucasian female with past medical history significant for diastolic dysfunction, depression, anxiety, TIA, recurrent UTI on prophylactic trimethoprim, chronic back pain on multiple medications.  Patient lives at [facility], long-term care nursing home.  Patient was suffering from progressive bilateral lower extremity edema, up to the thighs since about 2 weeks.  She also had 15 pound weight gain.  Patient was started on diuretics at the facility.  Patient may have been on diuretics for the last 48 hours.  Lower extremity edema has improved, however, worsening renal function is noted.  Patient also noted to be more lethargic and tired.  Prior to admission, patient was on clonazepam, gabapentin, Robaxin, mirtazapine, sertraline and tramadol.   Treated symptomatically, diuretics were held.  Medications were held.  clinically improving now.   Assessment & Plan:   Acute metabolic encephalopathy, this is likely polypharmacy related to AKI. Mental status is improving.  She does not have any focal deficits.  Head CT was normal. See medication management below.  AKI, recent known baseline creatinine 0.8.  Serum creatinine 1.35-1.43-1.6-1.37.  This is likely multifactorial, hypotension and diuretics.  Blood pressures are adequate now.  Discontinuing diuretics.  Will avoid giving IV fluids due to fluid overload.  Renal ultrasound is essentially normal.  No evidence of hydronephrosis or urinary retention.  Recheck tomorrow morning.   Already trending down.  Acute on chronic diastolic congestive heart failure: Recently treated for peripheral edema and 15 pound weight gain.     Echocardiogram with hyperdynamic heart, normal ejection fraction.  Grade 1 diastolic   Dysfunction.   She is euvolemic now.  If her renal functions remains stable, will discharge  on as needed furosemide.  Anxiety and depression, chronic back pain and debility: Patient at home on clonazepam 0.5 mg twice daily, continued Gabapentin 300 mg twice daily, started on 200 mg twice daily Robaxin, discontinued Tramadol, discontinued Sertraline 150 mg daily at home, on 50 mg daily Remeron, continue.  Will continue supportive therapies.  Work with PT OT today.  May need to add pain medications   Once her mobility improves.  History of TIA: On aspirin and Lipitor.  No new neurological deficits.    Continue to mobilize and monitor.  Anticipate back to [employer].    DVT prophylaxis: heparin injection 5,000 Units Start: 10/26/22 1530   Code Status: DNR with limited intervention Family Communication: Patient's   Son at the bedside.   patient's daughter on the phone. Disposition Plan: Status is: Inpatient Remains inpatient appropriate because: Significant abnormal renal functions, medication management     Consultants:  None  Procedures:  None  Antimicrobials:  None   Subjective:    Patient seen and examined.  She is more alert awake today.  She is more interactive.  She wants to walk in the hallway today.  Denies any chest pain or shortness of breath.  Objective: Vitals:   10/28/22 1336 10/28/22 2037 10/29/22 0500 10/29/22 0520  BP: (!) 119/44 (!) 114/44  (!) 130/46  Pulse: (!) 57 62  62  Resp: 18 18  18   Temp: (!) 97.5 F (36.4 C) 98.2 F (36.8 C)  97.7 F (36.5 C)  TempSrc: Oral Oral  Oral  SpO2: 98% 95%  91%  Weight:   60.4 kg   Height:        Intake/Output Summary (  PROGRESS NOTE    Samantha Crane  ZOX:[id number] DOB: 07-20-1929 DOA: 10/26/2022 PCP: Karna Dupes, MD    Brief Narrative:  Patient is a 89 year old Caucasian female with past medical history significant for diastolic dysfunction, depression, anxiety, TIA, recurrent UTI on prophylactic trimethoprim, chronic back pain on multiple medications.  Patient lives at [facility], long-term care nursing home.  Patient was suffering from progressive bilateral lower extremity edema, up to the thighs since about 2 weeks.  She also had 15 pound weight gain.  Patient was started on diuretics at the facility.  Patient may have been on diuretics for the last 48 hours.  Lower extremity edema has improved, however, worsening renal function is noted.  Patient also noted to be more lethargic and tired.  Prior to admission, patient was on clonazepam, gabapentin, Robaxin, mirtazapine, sertraline and tramadol.   Treated symptomatically, diuretics were held.  Medications were held.  clinically improving now.   Assessment & Plan:   Acute metabolic encephalopathy, this is likely polypharmacy related to AKI. Mental status is improving.  She does not have any focal deficits.  Head CT was normal. See medication management below.  AKI, recent known baseline creatinine 0.8.  Serum creatinine 1.35-1.43-1.6-1.37.  This is likely multifactorial, hypotension and diuretics.  Blood pressures are adequate now.  Discontinuing diuretics.  Will avoid giving IV fluids due to fluid overload.  Renal ultrasound is essentially normal.  No evidence of hydronephrosis or urinary retention.  Recheck tomorrow morning.   Already trending down.  Acute on chronic diastolic congestive heart failure: Recently treated for peripheral edema and 15 pound weight gain.     Echocardiogram with hyperdynamic heart, normal ejection fraction.  Grade 1 diastolic   Dysfunction.   She is euvolemic now.  If her renal functions remains stable, will discharge  on as needed furosemide.  Anxiety and depression, chronic back pain and debility: Patient at home on clonazepam 0.5 mg twice daily, continued Gabapentin 300 mg twice daily, started on 200 mg twice daily Robaxin, discontinued Tramadol, discontinued Sertraline 150 mg daily at home, on 50 mg daily Remeron, continue.  Will continue supportive therapies.  Work with PT OT today.  May need to add pain medications   Once her mobility improves.  History of TIA: On aspirin and Lipitor.  No new neurological deficits.    Continue to mobilize and monitor.  Anticipate back to [employer].    DVT prophylaxis: heparin injection 5,000 Units Start: 10/26/22 1530   Code Status: DNR with limited intervention Family Communication: Patient's   Son at the bedside.   patient's daughter on the phone. Disposition Plan: Status is: Inpatient Remains inpatient appropriate because: Significant abnormal renal functions, medication management     Consultants:  None  Procedures:  None  Antimicrobials:  None   Subjective:    Patient seen and examined.  She is more alert awake today.  She is more interactive.  She wants to walk in the hallway today.  Denies any chest pain or shortness of breath.  Objective: Vitals:   10/28/22 1336 10/28/22 2037 10/29/22 0500 10/29/22 0520  BP: (!) 119/44 (!) 114/44  (!) 130/46  Pulse: (!) 57 62  62  Resp: 18 18  18   Temp: (!) 97.5 F (36.4 C) 98.2 F (36.8 C)  97.7 F (36.5 C)  TempSrc: Oral Oral  Oral  SpO2: 98% 95%  91%  Weight:   60.4 kg   Height:        Intake/Output Summary (  Last 24 hours) at 10/29/2022 1156 Last data filed at 10/29/2022 1003 Gross per 24 hour  Intake 413 ml  Output --  Net 413 ml   Filed Weights   10/26/22 1650 10/27/22 0500 10/29/22 0500  Weight: 64.1 kg 59.3 kg 60.4 kg    Examination:  General exam: Appears calm and comfortable . Debilitated but comfortable today. She is not in any distress.    Patient is on  room air.  Pleasant to conversation. Respiratory system: No added sounds. Cardiovascular system: S1 & S2 heard, RRR.    No edema. Gastrointestinal system: Abdomen is nondistended, soft and nontender. No organomegaly or masses felt. Normal bowel sounds heard.   Data Reviewed: I have personally reviewed following labs and imaging studies  CBC: Recent Labs  Lab 10/26/22 1106 10/27/22 0603 10/28/22 0536 10/29/22 0548  WBC 6.1 5.1 6.3 6.9  NEUTROABS 4.3  --  3.8  --   HGB 10.9* 11.5* 12.6 12.2  HCT 34.3* 36.4 40.8 38.5  MCV 99.4 99.7 101.7* 99.2  PLT 147* 150 150 145*   Basic Metabolic Panel: Recent Labs  Lab 10/26/22 1106 10/27/22 0603 10/28/22 0536 10/28/22 0537 10/29/22 0548  NA 135 138 135 137 138  K 4.7 4.8 5.3* 5.4* 4.6  CL 100 97* 97* 97* 98  CO2 28 31 28 27 30   GLUCOSE 94 79 92 90 104*  BUN 38* 42* 44* 46* 44*  CREATININE 1.35* 1.43* 1.63* 1.60* 1.37*  CALCIUM 8.6* 8.9 8.8* 8.9 9.3  MG  --  1.9 2.0  --   --   PHOS  --   --   --  4.4  --    GFR: Estimated Creatinine Clearance: 24.1 mL/min (A) (by C-G formula based on SCr of 1.37 mg/dL (H)). Liver Function Tests: Recent Labs  Lab 10/26/22 1106 10/28/22 0537  AST 31  --   ALT 25  --   ALKPHOS 95  --   BILITOT 0.5  --   PROT 6.3*  --   ALBUMIN 3.6 3.6   No results for input(s): "LIPASE", "AMYLASE" in the last 168 hours. Recent Labs  Lab 10/26/22 1106  AMMONIA 21   Coagulation Profile: No results for input(s): "INR", "PROTIME" in the last 168 hours. Cardiac Enzymes: No results for input(s): "CKTOTAL", "CKMB", "CKMBINDEX", "TROPONINI" in the last 168 hours. BNP (last 3 results) No results for input(s): "PROBNP" in the last 8760 hours. HbA1C: No results for input(s): "HGBA1C" in the last 72 hours. CBG: No results for input(s): "GLUCAP" in the last 168 hours. Lipid Profile: No results for input(s): "CHOL", "HDL", "LDLCALC", "TRIG", "CHOLHDL", "LDLDIRECT" in the last 72 hours. Thyroid Function  Tests: No results for input(s): "TSH", "T4TOTAL", "FREET4", "T3FREE", "THYROIDAB" in the last 72 hours.  Anemia Panel: No results for input(s): "VITAMINB12", "FOLATE", "FERRITIN", "TIBC", "IRON", "RETICCTPCT" in the last 72 hours. Sepsis Labs: Recent Labs  Lab 10/26/22 1106  LATICACIDVEN 1.1    No results found for this or any previous visit (from the past 240 hour(s)).       Radiology Studies: ECHOCARDIOGRAM COMPLETE  Result Date: 10/28/2022    ECHOCARDIOGRAM REPORT   Patient Name:   Samantha Crane Date of Exam: 10/28/2022 Medical Rec #:  [id number]    Height:       65.0 in Accession #:    [id number]   Weight:       130.7 lb Date of Birth:  [birth year]    BSA:  Last 24 hours) at 10/29/2022 1156 Last data filed at 10/29/2022 1003 Gross per 24 hour  Intake 413 ml  Output --  Net 413 ml   Filed Weights   10/26/22 1650 10/27/22 0500 10/29/22 0500  Weight: 64.1 kg 59.3 kg 60.4 kg    Examination:  General exam: Appears calm and comfortable . Debilitated but comfortable today. She is not in any distress.    Patient is on  room air.  Pleasant to conversation. Respiratory system: No added sounds. Cardiovascular system: S1 & S2 heard, RRR.    No edema. Gastrointestinal system: Abdomen is nondistended, soft and nontender. No organomegaly or masses felt. Normal bowel sounds heard.   Data Reviewed: I have personally reviewed following labs and imaging studies  CBC: Recent Labs  Lab 10/26/22 1106 10/27/22 0603 10/28/22 0536 10/29/22 0548  WBC 6.1 5.1 6.3 6.9  NEUTROABS 4.3  --  3.8  --   HGB 10.9* 11.5* 12.6 12.2  HCT 34.3* 36.4 40.8 38.5  MCV 99.4 99.7 101.7* 99.2  PLT 147* 150 150 145*   Basic Metabolic Panel: Recent Labs  Lab 10/26/22 1106 10/27/22 0603 10/28/22 0536 10/28/22 0537 10/29/22 0548  NA 135 138 135 137 138  K 4.7 4.8 5.3* 5.4* 4.6  CL 100 97* 97* 97* 98  CO2 28 31 28 27 30   GLUCOSE 94 79 92 90 104*  BUN 38* 42* 44* 46* 44*  CREATININE 1.35* 1.43* 1.63* 1.60* 1.37*  CALCIUM 8.6* 8.9 8.8* 8.9 9.3  MG  --  1.9 2.0  --   --   PHOS  --   --   --  4.4  --    GFR: Estimated Creatinine Clearance: 24.1 mL/min (A) (by C-G formula based on SCr of 1.37 mg/dL (H)). Liver Function Tests: Recent Labs  Lab 10/26/22 1106 10/28/22 0537  AST 31  --   ALT 25  --   ALKPHOS 95  --   BILITOT 0.5  --   PROT 6.3*  --   ALBUMIN 3.6 3.6   No results for input(s): "LIPASE", "AMYLASE" in the last 168 hours. Recent Labs  Lab 10/26/22 1106  AMMONIA 21   Coagulation Profile: No results for input(s): "INR", "PROTIME" in the last 168 hours. Cardiac Enzymes: No results for input(s): "CKTOTAL", "CKMB", "CKMBINDEX", "TROPONINI" in the last 168 hours. BNP (last 3 results) No results for input(s): "PROBNP" in the last 8760 hours. HbA1C: No results for input(s): "HGBA1C" in the last 72 hours. CBG: No results for input(s): "GLUCAP" in the last 168 hours. Lipid Profile: No results for input(s): "CHOL", "HDL", "LDLCALC", "TRIG", "CHOLHDL", "LDLDIRECT" in the last 72 hours. Thyroid Function  Tests: No results for input(s): "TSH", "T4TOTAL", "FREET4", "T3FREE", "THYROIDAB" in the last 72 hours.  Anemia Panel: No results for input(s): "VITAMINB12", "FOLATE", "FERRITIN", "TIBC", "IRON", "RETICCTPCT" in the last 72 hours. Sepsis Labs: Recent Labs  Lab 10/26/22 1106  LATICACIDVEN 1.1    No results found for this or any previous visit (from the past 240 hour(s)).       Radiology Studies: ECHOCARDIOGRAM COMPLETE  Result Date: 10/28/2022    ECHOCARDIOGRAM REPORT   Patient Name:   Samantha Crane Date of Exam: 10/28/2022 Medical Rec #:  [id number]    Height:       65.0 in Accession #:    [id number]   Weight:       130.7 lb Date of Birth:  [birth year]    BSA:

## 2022-10-30 DIAGNOSIS — G9340 Encephalopathy, unspecified: Secondary | ICD-10-CM | POA: Diagnosis not present

## 2022-10-30 DIAGNOSIS — N179 Acute kidney failure, unspecified: Secondary | ICD-10-CM | POA: Diagnosis not present

## 2022-10-30 DIAGNOSIS — I10 Essential (primary) hypertension: Secondary | ICD-10-CM | POA: Diagnosis not present

## 2022-10-30 DIAGNOSIS — E875 Hyperkalemia: Secondary | ICD-10-CM | POA: Diagnosis not present

## 2022-10-30 LAB — BASIC METABOLIC PANEL
Anion gap: 11 (ref 5–15)
Anion gap: 11 (ref 5–15)
BUN: 42 mg/dL — ABNORMAL HIGH (ref 8–23)
BUN: 43 mg/dL — ABNORMAL HIGH (ref 8–23)
CO2: 30 mmol/L (ref 22–32)
CO2: 27 mmol/L (ref 22–32)
Calcium: 9.5 mg/dL (ref 8.9–10.3)
Calcium: 9.1 mg/dL (ref 8.9–10.3)
Chloride: 97 mmol/L — ABNORMAL LOW (ref 98–111)
Chloride: 100 mmol/L (ref 98–111)
Creatinine, Ser: 1.34 mg/dL — ABNORMAL HIGH (ref 0.44–1.00)
Creatinine, Ser: 1.5 mg/dL — ABNORMAL HIGH (ref 0.44–1.00)
GFR, Estimated: 37 mL/min — ABNORMAL LOW (ref >60)
GFR, Estimated: 33 mL/min — ABNORMAL LOW (ref >60)
Glucose, Bld: 117 mg/dL — ABNORMAL HIGH (ref 70–99)
Glucose, Bld: 107 mg/dL — ABNORMAL HIGH (ref 70–99)
Potassium: 5.3 mmol/L — ABNORMAL HIGH (ref 3.5–5.1)
Potassium: 4.8 mmol/L (ref 3.5–5.1)
Sodium: 138 mmol/L (ref 135–145)
Sodium: 138 mmol/L (ref 135–145)

## 2022-10-30 LAB — UREA NITROGEN, URINE: Urea Nitrogen, Ur: 701 mg/dL

## 2022-10-30 MED ORDER — SODIUM ZIRCONIUM CYCLOSILICATE 10 G PO PACK
10.0000 g | PACK | Freq: Once | ORAL | Status: AC
  Administered 2022-10-30: 10 g via ORAL
  Filled 2022-10-30: qty 1

## 2022-10-30 MED ORDER — FUROSEMIDE 20 MG PO TABS: 20.0000 mg | ORAL_TABLET | Freq: Every day | ORAL | Status: AC | PRN

## 2022-10-30 MED ORDER — CEPHALEXIN 250 MG PO CAPS: 250.0000 mg | ORAL_CAPSULE | Freq: Every day | ORAL | 2 refills | Status: AC

## 2022-10-30 MED ORDER — GABAPENTIN 100 MG PO CAPS: 200.0000 mg | ORAL_CAPSULE | Freq: Two times a day (BID) | ORAL | Status: AC

## 2022-10-30 MED ORDER — SERTRALINE HCL 50 MG PO TABS: 50.0000 mg | ORAL_TABLET | Freq: Every day | ORAL | Status: AC

## 2022-10-30 NOTE — TOC Progression Note (Addendum)
Transition of Care [facility]) - Progression Note    Patient Details  Name: Samantha Crane MRN: [id number] Date of Birth: 04/16/1929  [facility] CM/SW Contact  Beckie Busing, RN Phone Number:[phone]  10/30/2022, 11:34 AM  Clinical Narrative:    Cm called [facility] and spoke with Rakaya who states that patient came from SNF bed at the facility and is able to return. CM spoke with daughter Samantha Crane who confirms that the plan is for patient to return to [facility] upon discharge.   1218 FL2 complete PASRR verified #[id number]       Expected Discharge Plan and Services                                               Social Determinants of Health (SDOH) Interventions SDOH Screenings   Food Insecurity: No Food Insecurity (10/26/2022)  Housing: Low Risk  (10/26/2022)  Transportation Needs: No Transportation Needs (10/26/2022)  Utilities: Not At Risk (10/26/2022)  Tobacco Use: Medium Risk (10/26/2022)    Readmission Risk Interventions    08/22/2020    8:38 AM  Readmission Risk Prevention Plan  HRI or Home Care Consult Complete  Social Work Consult for Recovery Care Planning/Counseling Complete  Palliative Care Screening Not Applicable

## 2022-10-30 NOTE — Discharge Summary (Signed)
Physician Discharge Summary   Patient: Samantha Crane MRN: [id number] DOB: 09-02-1929  Admit date:     10/26/2022  Discharge date: 10/30/22  Discharge Physician: Marinda Elk   PCP: Karna Dupes, MD   Recommendations at discharge:   Patient is DNR Patient may get out of bed with two-person assistance Patient may consume a low sodium diet Please ensure patient follows up with facility provider per protocol Please ensure patient maintains all outpatient follow up appointments. A number of medication changes have been made . Please refer to the attached Med reconciliation for details. Please weigh patient daily. Patient may take a dose of 20mg  of Lasix only if weight increases by more than 2 pounds in one day or 5 pounds in one week.   Discharge Diagnoses: Principal Problem:   AKI (acute kidney injury) (HCC) Active Problems:   Anxiety   Essential hypertension   Personal history of transient ischemic attack (TIA), and cerebral infarction without residual deficits   Depression   Acute encephalopathy   Hyperkalemia  Resolved Problems:   * No resolved hospital problems. *   Hospital Course: 89 year old female with past medical history of diastolic congestive heart failure (G1DD with EF 70-75%), prior TIA, depression, recurrent urinary tract infections on trimethoprim suppression therapy, chronic back pain presenting to [facility] emergency department with worsening lethargy from her assisted living facility.    Upon evaluation in the emergency department patient was felt to be suffering from acute metabolic encephalopathy multifactorial due to volume depletion and polypharmacy with mild associated acute kidney injury.  Line depletion was felt to be associated with recent diuresis.  The hospitalist group was called and the patient was admitted to the hospital service.  Home regimen of diuretics were held.  Additionally, a number of other medication changes were made  due to patients polypharmacy including including switching patients daily antibiotic from Trimethoprim (due to recurrent hyperkalemia) to Keflex, reducing Sertraline from 150mg  daily to 50mg  daily and gabapentin was reduced to 200mg  twice daily.   Hospital course was compilcated by bouts of mild hyperkalemia which resolved with gentle PO/IV hydration and doses of Lokelma.  Renal function improved throughout the hosptialization.  Patient clinically improve and was eventually discharged back to her skilled nursing facility in improved and stable condition.          Pain control - [employer] Controlled Substance Reporting System database was reviewed. and patient was instructed, not to drive, operate heavy machinery, perform activities at heights, swimming or participation in water activities or provide baby-sitting services while on Pain, Sleep and Anxiety Medications; until their outpatient Physician has advised to do so again. Also recommended to not to take more than prescribed Pain, Sleep and Anxiety Medications.   Consultants: None Procedures performed: None  Disposition: Skilled nursing facility Diet recommendation:  Discharge Diet Orders (From admission, onward)     Start     Ordered   10/30/22 0000  Diet - low sodium heart healthy        10/30/22 1354           Cardiac diet  DISCHARGE MEDICATION: Allergies as of 10/30/2022       Reactions   Prednisone Other (See Comments)   Manic tendencies   Amlodipine Swelling   Amoxicillin Other (See Comments)   Unknown reaction   Amoxicillin-pot Clavulanate Diarrhea   Tolerated Unasyn 08/2020   Lactose Intolerance (gi) Other (See Comments)   Unknown reaction   Limonene Diarrhea   Sulfa  Physician Discharge Summary   Patient: Samantha Crane MRN: [id number] DOB: 09-02-1929  Admit date:     10/26/2022  Discharge date: 10/30/22  Discharge Physician: Marinda Elk   PCP: Karna Dupes, MD   Recommendations at discharge:   Patient is DNR Patient may get out of bed with two-person assistance Patient may consume a low sodium diet Please ensure patient follows up with facility provider per protocol Please ensure patient maintains all outpatient follow up appointments. A number of medication changes have been made . Please refer to the attached Med reconciliation for details. Please weigh patient daily. Patient may take a dose of 20mg  of Lasix only if weight increases by more than 2 pounds in one day or 5 pounds in one week.   Discharge Diagnoses: Principal Problem:   AKI (acute kidney injury) (HCC) Active Problems:   Anxiety   Essential hypertension   Personal history of transient ischemic attack (TIA), and cerebral infarction without residual deficits   Depression   Acute encephalopathy   Hyperkalemia  Resolved Problems:   * No resolved hospital problems. *   Hospital Course: 89 year old female with past medical history of diastolic congestive heart failure (G1DD with EF 70-75%), prior TIA, depression, recurrent urinary tract infections on trimethoprim suppression therapy, chronic back pain presenting to [facility] emergency department with worsening lethargy from her assisted living facility.    Upon evaluation in the emergency department patient was felt to be suffering from acute metabolic encephalopathy multifactorial due to volume depletion and polypharmacy with mild associated acute kidney injury.  Line depletion was felt to be associated with recent diuresis.  The hospitalist group was called and the patient was admitted to the hospital service.  Home regimen of diuretics were held.  Additionally, a number of other medication changes were made  due to patients polypharmacy including including switching patients daily antibiotic from Trimethoprim (due to recurrent hyperkalemia) to Keflex, reducing Sertraline from 150mg  daily to 50mg  daily and gabapentin was reduced to 200mg  twice daily.   Hospital course was compilcated by bouts of mild hyperkalemia which resolved with gentle PO/IV hydration and doses of Lokelma.  Renal function improved throughout the hosptialization.  Patient clinically improve and was eventually discharged back to her skilled nursing facility in improved and stable condition.          Pain control - [employer] Controlled Substance Reporting System database was reviewed. and patient was instructed, not to drive, operate heavy machinery, perform activities at heights, swimming or participation in water activities or provide baby-sitting services while on Pain, Sleep and Anxiety Medications; until their outpatient Physician has advised to do so again. Also recommended to not to take more than prescribed Pain, Sleep and Anxiety Medications.   Consultants: None Procedures performed: None  Disposition: Skilled nursing facility Diet recommendation:  Discharge Diet Orders (From admission, onward)     Start     Ordered   10/30/22 0000  Diet - low sodium heart healthy        10/30/22 1354           Cardiac diet  DISCHARGE MEDICATION: Allergies as of 10/30/2022       Reactions   Prednisone Other (See Comments)   Manic tendencies   Amlodipine Swelling   Amoxicillin Other (See Comments)   Unknown reaction   Amoxicillin-pot Clavulanate Diarrhea   Tolerated Unasyn 08/2020   Lactose Intolerance (gi) Other (See Comments)   Unknown reaction   Limonene Diarrhea   Sulfa  and nontender.  No evidence of intra-abdominal masses.  Positive bowel sounds noted in all quadrants.   Musculoskeletal: No joint deformity upper and lower extremities. Good ROM, no contractures. Normal muscle tone.     Condition at discharge: fair  The results of significant diagnostics from this hospitalization (including imaging, microbiology, ancillary and laboratory) are listed below for reference.   Imaging Studies: ECHOCARDIOGRAM COMPLETE  Result Date: 10/28/2022    ECHOCARDIOGRAM REPORT   Patient Name:   CHEKESHA BEHLKE Date of Exam: 10/28/2022 Medical Rec #:  [id number]    Height:       65.0 in Accession #:    [id number]   Weight:       130.7 lb Date of Birth:  October 24, 1929    BSA:          1.651 m Patient Age:    89 years     BP:           119/44 mmHg Patient Gender: F            HR:           70 bpm. Exam Location:  Inpatient  Procedure: 2D Echo, Color Doppler and Cardiac Doppler Indications:    ; R60.0 Lower extremity edema; I50.31 Acute diastolic                 (congestive) heart failure  History:        Patient has prior history of Echocardiogram examinations. CHF;                 Signs/Symptoms:Fatigue and Edema.  Sonographer:    Darlys Gales Referring Phys: [id number] SYLVESTER I OGBATA IMPRESSIONS  1. Left ventricular ejection fraction, by estimation, is 70 to 75%. The left ventricle has hyperdynamic function. The left ventricle has no regional wall motion abnormalities. Left ventricular diastolic parameters are consistent with Grade I diastolic dysfunction (impaired relaxation).  2. Right ventricular systolic function is normal. The right ventricular size is normal.  3. The mitral valve is normal in structure. No evidence of mitral valve regurgitation. No evidence of mitral stenosis.  4. The aortic valve is tricuspid. There is mild calcification of the aortic valve. Aortic valve regurgitation is not visualized. Aortic valve sclerosis/calcification is present, without any evidence of aortic stenosis.  5. The inferior vena cava is normal in size with greater than 50% respiratory variability, suggesting right atrial pressure of 3 mmHg. FINDINGS  Left Ventricle: Left ventricular ejection fraction, by estimation, is 70 to 75%. The left ventricle has hyperdynamic function. The left ventricle has no regional wall motion abnormalities. The left ventricular internal cavity size was normal in size. There is no left ventricular hypertrophy. Left ventricular diastolic parameters are consistent with Grade I diastolic dysfunction (impaired relaxation). Right Ventricle: The right ventricular size is normal. No increase in right ventricular wall thickness. Right ventricular systolic function is normal. Left Atrium: Left atrial size was normal in size. Right Atrium: Right atrial size was normal in size. Pericardium: There is no evidence of pericardial  effusion. Mitral Valve: The mitral valve is normal in structure. No evidence of mitral valve regurgitation. No evidence of mitral valve stenosis. Tricuspid Valve: The tricuspid valve is normal in structure. Tricuspid valve regurgitation is trivial. No evidence of tricuspid stenosis. Aortic Valve: The aortic valve is tricuspid. There is mild calcification of the aortic valve. Aortic valve regurgitation is not visualized. Aortic valve sclerosis/calcification is present, without any evidence of aortic stenosis. Aortic valve mean gradient measures 4.0  and nontender.  No evidence of intra-abdominal masses.  Positive bowel sounds noted in all quadrants.   Musculoskeletal: No joint deformity upper and lower extremities. Good ROM, no contractures. Normal muscle tone.     Condition at discharge: fair  The results of significant diagnostics from this hospitalization (including imaging, microbiology, ancillary and laboratory) are listed below for reference.   Imaging Studies: ECHOCARDIOGRAM COMPLETE  Result Date: 10/28/2022    ECHOCARDIOGRAM REPORT   Patient Name:   CHEKESHA BEHLKE Date of Exam: 10/28/2022 Medical Rec #:  [id number]    Height:       65.0 in Accession #:    [id number]   Weight:       130.7 lb Date of Birth:  October 24, 1929    BSA:          1.651 m Patient Age:    89 years     BP:           119/44 mmHg Patient Gender: F            HR:           70 bpm. Exam Location:  Inpatient  Procedure: 2D Echo, Color Doppler and Cardiac Doppler Indications:    ; R60.0 Lower extremity edema; I50.31 Acute diastolic                 (congestive) heart failure  History:        Patient has prior history of Echocardiogram examinations. CHF;                 Signs/Symptoms:Fatigue and Edema.  Sonographer:    Darlys Gales Referring Phys: [id number] SYLVESTER I OGBATA IMPRESSIONS  1. Left ventricular ejection fraction, by estimation, is 70 to 75%. The left ventricle has hyperdynamic function. The left ventricle has no regional wall motion abnormalities. Left ventricular diastolic parameters are consistent with Grade I diastolic dysfunction (impaired relaxation).  2. Right ventricular systolic function is normal. The right ventricular size is normal.  3. The mitral valve is normal in structure. No evidence of mitral valve regurgitation. No evidence of mitral stenosis.  4. The aortic valve is tricuspid. There is mild calcification of the aortic valve. Aortic valve regurgitation is not visualized. Aortic valve sclerosis/calcification is present, without any evidence of aortic stenosis.  5. The inferior vena cava is normal in size with greater than 50% respiratory variability, suggesting right atrial pressure of 3 mmHg. FINDINGS  Left Ventricle: Left ventricular ejection fraction, by estimation, is 70 to 75%. The left ventricle has hyperdynamic function. The left ventricle has no regional wall motion abnormalities. The left ventricular internal cavity size was normal in size. There is no left ventricular hypertrophy. Left ventricular diastolic parameters are consistent with Grade I diastolic dysfunction (impaired relaxation). Right Ventricle: The right ventricular size is normal. No increase in right ventricular wall thickness. Right ventricular systolic function is normal. Left Atrium: Left atrial size was normal in size. Right Atrium: Right atrial size was normal in size. Pericardium: There is no evidence of pericardial  effusion. Mitral Valve: The mitral valve is normal in structure. No evidence of mitral valve regurgitation. No evidence of mitral valve stenosis. Tricuspid Valve: The tricuspid valve is normal in structure. Tricuspid valve regurgitation is trivial. No evidence of tricuspid stenosis. Aortic Valve: The aortic valve is tricuspid. There is mild calcification of the aortic valve. Aortic valve regurgitation is not visualized. Aortic valve sclerosis/calcification is present, without any evidence of aortic stenosis. Aortic valve mean gradient measures 4.0  and nontender.  No evidence of intra-abdominal masses.  Positive bowel sounds noted in all quadrants.   Musculoskeletal: No joint deformity upper and lower extremities. Good ROM, no contractures. Normal muscle tone.     Condition at discharge: fair  The results of significant diagnostics from this hospitalization (including imaging, microbiology, ancillary and laboratory) are listed below for reference.   Imaging Studies: ECHOCARDIOGRAM COMPLETE  Result Date: 10/28/2022    ECHOCARDIOGRAM REPORT   Patient Name:   CHEKESHA BEHLKE Date of Exam: 10/28/2022 Medical Rec #:  [id number]    Height:       65.0 in Accession #:    [id number]   Weight:       130.7 lb Date of Birth:  October 24, 1929    BSA:          1.651 m Patient Age:    89 years     BP:           119/44 mmHg Patient Gender: F            HR:           70 bpm. Exam Location:  Inpatient  Procedure: 2D Echo, Color Doppler and Cardiac Doppler Indications:    ; R60.0 Lower extremity edema; I50.31 Acute diastolic                 (congestive) heart failure  History:        Patient has prior history of Echocardiogram examinations. CHF;                 Signs/Symptoms:Fatigue and Edema.  Sonographer:    Darlys Gales Referring Phys: [id number] SYLVESTER I OGBATA IMPRESSIONS  1. Left ventricular ejection fraction, by estimation, is 70 to 75%. The left ventricle has hyperdynamic function. The left ventricle has no regional wall motion abnormalities. Left ventricular diastolic parameters are consistent with Grade I diastolic dysfunction (impaired relaxation).  2. Right ventricular systolic function is normal. The right ventricular size is normal.  3. The mitral valve is normal in structure. No evidence of mitral valve regurgitation. No evidence of mitral stenosis.  4. The aortic valve is tricuspid. There is mild calcification of the aortic valve. Aortic valve regurgitation is not visualized. Aortic valve sclerosis/calcification is present, without any evidence of aortic stenosis.  5. The inferior vena cava is normal in size with greater than 50% respiratory variability, suggesting right atrial pressure of 3 mmHg. FINDINGS  Left Ventricle: Left ventricular ejection fraction, by estimation, is 70 to 75%. The left ventricle has hyperdynamic function. The left ventricle has no regional wall motion abnormalities. The left ventricular internal cavity size was normal in size. There is no left ventricular hypertrophy. Left ventricular diastolic parameters are consistent with Grade I diastolic dysfunction (impaired relaxation). Right Ventricle: The right ventricular size is normal. No increase in right ventricular wall thickness. Right ventricular systolic function is normal. Left Atrium: Left atrial size was normal in size. Right Atrium: Right atrial size was normal in size. Pericardium: There is no evidence of pericardial  effusion. Mitral Valve: The mitral valve is normal in structure. No evidence of mitral valve regurgitation. No evidence of mitral valve stenosis. Tricuspid Valve: The tricuspid valve is normal in structure. Tricuspid valve regurgitation is trivial. No evidence of tricuspid stenosis. Aortic Valve: The aortic valve is tricuspid. There is mild calcification of the aortic valve. Aortic valve regurgitation is not visualized. Aortic valve sclerosis/calcification is present, without any evidence of aortic stenosis. Aortic valve mean gradient measures 4.0  and nontender.  No evidence of intra-abdominal masses.  Positive bowel sounds noted in all quadrants.   Musculoskeletal: No joint deformity upper and lower extremities. Good ROM, no contractures. Normal muscle tone.     Condition at discharge: fair  The results of significant diagnostics from this hospitalization (including imaging, microbiology, ancillary and laboratory) are listed below for reference.   Imaging Studies: ECHOCARDIOGRAM COMPLETE  Result Date: 10/28/2022    ECHOCARDIOGRAM REPORT   Patient Name:   CHEKESHA BEHLKE Date of Exam: 10/28/2022 Medical Rec #:  [id number]    Height:       65.0 in Accession #:    [id number]   Weight:       130.7 lb Date of Birth:  October 24, 1929    BSA:          1.651 m Patient Age:    89 years     BP:           119/44 mmHg Patient Gender: F            HR:           70 bpm. Exam Location:  Inpatient  Procedure: 2D Echo, Color Doppler and Cardiac Doppler Indications:    ; R60.0 Lower extremity edema; I50.31 Acute diastolic                 (congestive) heart failure  History:        Patient has prior history of Echocardiogram examinations. CHF;                 Signs/Symptoms:Fatigue and Edema.  Sonographer:    Darlys Gales Referring Phys: [id number] SYLVESTER I OGBATA IMPRESSIONS  1. Left ventricular ejection fraction, by estimation, is 70 to 75%. The left ventricle has hyperdynamic function. The left ventricle has no regional wall motion abnormalities. Left ventricular diastolic parameters are consistent with Grade I diastolic dysfunction (impaired relaxation).  2. Right ventricular systolic function is normal. The right ventricular size is normal.  3. The mitral valve is normal in structure. No evidence of mitral valve regurgitation. No evidence of mitral stenosis.  4. The aortic valve is tricuspid. There is mild calcification of the aortic valve. Aortic valve regurgitation is not visualized. Aortic valve sclerosis/calcification is present, without any evidence of aortic stenosis.  5. The inferior vena cava is normal in size with greater than 50% respiratory variability, suggesting right atrial pressure of 3 mmHg. FINDINGS  Left Ventricle: Left ventricular ejection fraction, by estimation, is 70 to 75%. The left ventricle has hyperdynamic function. The left ventricle has no regional wall motion abnormalities. The left ventricular internal cavity size was normal in size. There is no left ventricular hypertrophy. Left ventricular diastolic parameters are consistent with Grade I diastolic dysfunction (impaired relaxation). Right Ventricle: The right ventricular size is normal. No increase in right ventricular wall thickness. Right ventricular systolic function is normal. Left Atrium: Left atrial size was normal in size. Right Atrium: Right atrial size was normal in size. Pericardium: There is no evidence of pericardial  effusion. Mitral Valve: The mitral valve is normal in structure. No evidence of mitral valve regurgitation. No evidence of mitral valve stenosis. Tricuspid Valve: The tricuspid valve is normal in structure. Tricuspid valve regurgitation is trivial. No evidence of tricuspid stenosis. Aortic Valve: The aortic valve is tricuspid. There is mild calcification of the aortic valve. Aortic valve regurgitation is not visualized. Aortic valve sclerosis/calcification is present, without any evidence of aortic stenosis. Aortic valve mean gradient measures 4.0

## 2022-10-30 NOTE — Plan of Care (Addendum)
Report called to nurse Mayford Knife at [facility]. Patient aware of discharge and daughter Larita Fife notified, at the bedside. PIV removed, patient in stable condition. 1730: [facility] arrived to pick up patient. Patient safely transported out, VSS.  Problem: Education: Goal: Ability to demonstrate management of disease process will improve Outcome: Progressing Goal: Ability to verbalize understanding of medication therapies will improve Outcome: Progressing Goal: Individualized Educational Video(s) Outcome: Progressing   Problem: Activity: Goal: Capacity to carry out activities will improve Outcome: Progressing   Problem: Cardiac: Goal: Ability to achieve and maintain adequate cardiopulmonary perfusion will improve Outcome: Progressing   Problem: Education: Goal: Knowledge of General Education information will improve Description: Including pain rating scale, medication(s)/side effects and non-pharmacologic comfort measures Outcome: Progressing   Problem: Health Behavior/Discharge Planning: Goal: Ability to manage health-related needs will improve Outcome: Progressing   Problem: Clinical Measurements: Goal: Ability to maintain clinical measurements within normal limits will improve Outcome: Progressing Goal: Will remain free from infection Outcome: Progressing Goal: Diagnostic test results will improve Outcome: Progressing Goal: Respiratory complications will improve Outcome: Progressing Goal: Cardiovascular complication will be avoided Outcome: Progressing   Problem: Activity: Goal: Risk for activity intolerance will decrease Outcome: Progressing   Problem: Nutrition: Goal: Adequate nutrition will be maintained Outcome: Progressing   Problem: Coping: Goal: Level of anxiety will decrease Outcome: Progressing   Problem: Elimination: Goal: Will not experience complications related to bowel motility Outcome: Progressing Goal: Will not experience complications related to  urinary retention Outcome: Progressing   Problem: Pain Managment: Goal: General experience of comfort will improve Outcome: Progressing   Problem: Safety: Goal: Ability to remain free from injury will improve Outcome: Progressing   Problem: Skin Integrity: Goal: Risk for impaired skin integrity will decrease Outcome: Progressing

## 2022-10-30 NOTE — Progress Notes (Signed)
Physical Therapy Treatment Patient Details Name: Bawi Bishara MRN: [id number] DOB: Jun 09, 1929 Today's Date: 10/30/2022   History of Present Illness Patient is a 89 year old female who was admitted with BLE edema, increased lethargy and fatigue. Patient was admitted with acute metabolic encephalopathy, AKI, acute on chronic diastolic CHF, PMH: anxiety, depression, chronic pain, TIA, back precautions.    PT Comments   Pt admitted with above diagnosis.  Pt currently with functional limitations due to the deficits listed below (see PT Problem List). Pt in bed when PT arrived. Pt lethargic initially and as tx progressed becoming more alert and engaged with therapeutic activity. Pt required max A x 2 for bed mobility, pt demonstrated strong posterior and R lateral lean with poor cervical positioning and pt unable to attend to midline or perform cervical extension. Pt required min A x 2 for sit to stand  from EOB, mod A for eccentric control to commode, max A for sit to stand  from commode. Gait training to and from bathroom 15 feet x 2 with RW min A x 1 to commode and min A x 2 from commode to recliner due to fatigue. PT assessed o2 saturation throughout intervention: 96% on 2 L/min at rest, 98% on RA at rest, 88% on RA seated EOB with ADL tasks, pt unable to recover to 90% with cues for pursed lip breathing, 1 l/min donned and pt quickly recovered to 90%, gait tasks on RA with first amb bout 90%, 92% on RA seated on commode and 89% s/p second amb bout. Pt left seated in recliner, all needs in place and on 2L/min and O2 saturation 96%.  Pt will benefit from acute skilled PT to increase their independence and safety with mobility to allow discharge.    If plan is discharge home, recommend the following: Two people to help with walking and/or transfers;A lot of help with bathing/dressing/bathroom;Assistance with cooking/housework;Assist for transportation   Can travel by private vehicle     No  Equipment  Recommendations  None recommended by PT at this time TBD in next venue   Recommendations for Other Services       Precautions / Restrictions Precautions Precautions: Back Precaution Comments: h/o vertebral fxs Restrictions Weight Bearing Restrictions: No     Mobility  Bed Mobility Overal bed mobility: Needs Assistance Bed Mobility: Supine to Sit     Supine to sit: Max assist, +2 for physical assistance, +2 for safety/equipment     General bed mobility comments: max A x 2 increaed time, cues and use of bed pad, pt demonstrated strong posterior and R lateral lean, constant cues to maintain B LEs on floor with absent abiltiy to obtain or maintain head at midline with R lateral side bend and excessive cervical flexion    Transfers Overall transfer level: Needs assistance Equipment used: Rolling walker (2 wheels) Transfers: Sit to/from Stand Sit to Stand: +2 physical assistance, +2 safety/equipment, From elevated surface, Min assist           General transfer comment: pt required min A x 2 for sit to stand from EOB to RW and max A for sit to stand from standard commode seat and mod A to control decent to commode and min A and strong cues for UE placement and eccentric contol to recliner    Ambulation/Gait Ambulation/Gait assistance: Min assist, +2 physical assistance, +2 safety/equipment Gait Distance (Feet): 15 Feet Assistive device: Rolling walker (2 wheels) Gait Pattern/deviations: Step-to pattern, Shuffle, Trunk flexed, Narrow base of  support Gait velocity: decreased     General Gait Details: gait from EOB to commode with min A x 1 assist for balance and RW management and strong cues for posture and cervical extension for improved visual scanning with pt colliding wtih objects L and R, commode to recliner pt fatigued and required min A x 2 for safety   Stairs             Wheelchair Mobility     Tilt Bed    Modified Rankin (Stroke Patients Only)        Balance Overall balance assessment: Needs assistance Sitting-balance support: Feet supported, Bilateral upper extremity supported Sitting balance-Leahy Scale: Fair Sitting balance - Comments: posterior and R side bend with poor cervical positioning with excessive cervical felxion and R side bend pt unable to perform cervical extension or obtain midline. PT able to place head at midline and pt unable to maintain with cues. pt required strong cues and facilitation to maintain B LE on floor. pt able to participate with BADLs seated EOB and noted LOB requiring min A to recover   Standing balance support: Reliant on assistive device for balance, Bilateral upper extremity supported Standing balance-Leahy Scale: Poor                              Cognition Arousal: Lethargic Behavior During Therapy: Flat affect Overall Cognitive Status: Difficult to assess                                 General Comments: pt initally a little lethargic and as tx session progressed able to demonstrate improved alertness, communication and ability to engage with therapy session today        Exercises      General Comments        Pertinent Vitals/Pain      Home Living Family/patient expects to be discharged to:: Assisted living                 Home Equipment: Rollator (4 wheels) Additional Comments: patient lives at [facility]    Prior Function            PT Goals (current goals can now be found in the care plan section) Acute Rehab PT Goals Patient Stated Goal: states to care for self PT Goal Formulation: With patient Time For Goal Achievement: 11/11/22 Potential to Achieve Goals: Fair Progress towards PT goals: Progressing toward goals    Frequency    Min 1X/week      PT Plan      Co-evaluation              AM-PAC PT "6 Clicks" Mobility   Outcome Measure  Help needed turning from your back to your side while in a flat bed without  using bedrails?: A Lot Help needed moving from lying on your back to sitting on the side of a flat bed without using bedrails?: A Lot Help needed moving to and from a bed to a chair (including a wheelchair)?: A Lot Help needed standing up from a chair using your arms (e.g., wheelchair or bedside chair)?: A Lot Help needed to walk in hospital room?: A Lot Help needed climbing 3-5 steps with a railing? : Total 6 Click Score: 11    End of Session Equipment Utilized During Treatment: Gait belt Activity Tolerance: Patient limited by fatigue  Patient left: with call bell/phone within reach;in chair;with chair alarm set Nurse Communication: Mobility status PT Visit Diagnosis: Unsteadiness on feet (R26.81);Muscle weakness (generalized) (M62.81);Difficulty in walking, not elsewhere classified (R26.2)     Time: 8413-2440 PT Time Calculation (min) (ACUTE ONLY): 36 min  Charges:    $Gait Training: 8-22 mins $Therapeutic Activity: 8-22 mins PT General Charges $$ ACUTE PT VISIT: 1 Visit                     Johnny Bridge, PT Acute Rehab    Jacqualyn Posey 10/30/2022, 2:12 PM

## 2022-10-30 NOTE — Hospital Course (Addendum)
89 year old female with past medical history of diastolic congestive heart failure (G1DD with EF 70-75%), prior TIA, depression, recurrent urinary tract infections on trimethoprim suppression therapy, chronic back pain presenting to [facility] emergency department with worsening lethargy from her assisted living facility.    Upon evaluation in the emergency department patient was felt to be suffering from acute metabolic encephalopathy multifactorial due to volume depletion and polypharmacy with mild associated acute kidney injury.  Line depletion was felt to be associated with recent diuresis.  The hospitalist group was called and the patient was admitted to the hospital service.  Home regimen of diuretics were held.  Additionally, a number of other medication changes were made due to patients polypharmacy including including switching patients daily antibiotic from Trimethoprim (due to recurrent hyperkalemia) to Keflex, reducing Sertraline from 150mg  daily to 50mg  daily and gabapentin was reduced to 200mg  twice daily.   Hospital course was compilcated by bouts of mild hyperkalemia which resolved with gentle PO/IV hydration and doses of Lokelma.  Renal function improved throughout the hosptialization.  Patient clinically improve and was eventually discharged back to her skilled nursing facility in improved and stable condition.

## 2022-10-30 NOTE — TOC Transition Note (Signed)
Transition of Care [facility]) - CM/SW Discharge Note   Patient Details  Name: Samantha Crane MRN: [id number] Date of Birth: May 05, 1929  Transition of Care [facility]) CM/SW Contact:  Beckie Busing, RN Phone Number:[phone]  10/30/2022, 2:24 PM   Clinical Narrative:    CM followed up with [facility] spoke with Minna Antis  to update that d/c summary and SNF transport form have been faxed via the [facility]. Daughter Larita Fife has been updated. Discharge packet is at nurses station. Transportation has been arranged via [facility].   Nursing please call report to [facility] [phone] [facility] 1 Rm[address][id number]          Patient Goals and CMS Choice      Discharge Placement                         Discharge Plan and Services Additional resources added to the After Visit Summary for                                       Social Determinants of Health (SDOH) Interventions SDOH Screenings   Food Insecurity: No Food Insecurity (10/26/2022)  Housing: Low Risk  (10/26/2022)  Transportation Needs: No Transportation Needs (10/26/2022)  Utilities: Not At Risk (10/26/2022)  Tobacco Use: Medium Risk (10/26/2022)     Readmission Risk Interventions    08/22/2020    8:38 AM  Readmission Risk Prevention Plan  HRI or Home Care Consult Complete  Social Work Consult for Recovery Care Planning/Counseling Complete  Palliative Care Screening Not Applicable

## 2022-10-30 NOTE — Discharge Instructions (Addendum)
Patient is DNR Patient may get out of bed with two-person assistance Patient may consume a low sodium diet Please ensure patient follows up with facility provider per protocol Please ensure patient maintains all outpatient follow up appointments. A number of medication changes have been made . Please refer to the attached Med reconciliation for details. Please weigh patient daily. Patient may take a dose of 20mg  of Lasix only if weight increases by more than 2 pounds in one day or 5 pounds in one week.

## 2022-10-30 NOTE — Plan of Care (Signed)

## 2022-10-30 NOTE — [place] FL2 (Signed)
[place] MEDICAID FL2 LEVEL OF CARE FORM     IDENTIFICATION  Patient Name: Samantha Crane Birthdate: 01-03-1930 Sex: female Admission Date (Current Location): 10/26/2022  [facility] and [place] Number:  Producer, television/film/video and Address:  [facility],  [address] [place], [place] [address]      Provider Number: [id number]  Attending Physician Name and Address:  Marinda Elk, MD  Relative Name and Phone Number:  Derry Lory [phone]    Current Level of Care: SNF Recommended Level of Care: Skilled Nursing Facility Prior Approval Number:    Date Approved/Denied:   PASRR Number: [id number]  Discharge Plan: SNF    Current Diagnoses: Patient Active Problem List   Diagnosis Date Noted   AKI (acute kidney injury) (HCC) 10/26/2022   Acute encephalopathy 10/26/2022   Acute metabolic encephalopathy 08/26/2021   Depression 08/26/2021   Multiple rib fractures 08/26/2021   Closed fracture of multiple thoracic vertebrae (HCC) 08/26/2021   Fracture of one rib, left side, initial encounter for closed fracture 01/23/2021   Weakness 09/22/2020   TIA (transient ischemic attack)    Frailty 09/13/2020   Acute on chronic respiratory failure with hypoxia (HCC) 08/20/2020   Acute on chronic diastolic CHF (congestive heart failure) (HCC) 08/20/2020   Sepsis (HCC) 08/19/2020   Cellulitis of left leg 08/18/2020   COVID-19 01/13/2019   Hyponatremia 12/18/2016   Incomplete emptying of bladder 11/15/2016   Prolapse of female genital organs 10/21/2016   Acute lower UTI 10/21/2016   Vaginal atrophy 10/21/2016   Right sciatic nerve pain 07/08/2016   Chronic nonseasonal allergic rhinitis due to pollen 02/09/2016   Environmental allergies 02/09/2016   Allergic rhinitis 11/29/2015   Cough 11/29/2015   History of food allergy 11/29/2015   Primary insomnia 06/05/2015   Subacute maxillary sinusitis 01/06/2015   Chronic diarrhea 06/20/2014   Chronic kidney disease (CKD) stage  G3a/A1, moderately decreased glomerular filtration rate (GFR) between 45-59 mL/min/1.73 square meter and albuminuria creatinine ratio less than 30 mg/g (HCC) 11/30/2013   Alopecia 03/17/2013   Dermatitis 12/04/2012   B-complex deficiency 11/19/2012   Essential tremor 11/19/2012   Duodenal ulcer with hemorrhage 05/20/2012   Orthostatic tremor 02/19/2012   Carotid stenosis, bilateral 09/17/2011   Stenosis of carotid artery 09/17/2011   Hyperlipidemia 04/16/2011   Personal history of transient ischemic attack (TIA), and cerebral infarction without residual deficits 03/04/2011   Vitamin D deficiency 09/20/2010   Anxiety 08/08/2010   Essential hypertension 08/08/2010   Irritable bowel syndrome without diarrhea 08/08/2010   Major depressive disorder, single episode 08/08/2010   Spinal stenosis 08/08/2007    Orientation RESPIRATION BLADDER Height & Weight     Self, Time, Situation, Place  Normal Incontinent Weight: 59.8 kg Height:  5\' 5"  (165.1 cm)  BEHAVIORAL SYMPTOMS/MOOD NEUROLOGICAL BOWEL NUTRITION STATUS     (n/a) Continent    AMBULATORY STATUS COMMUNICATION OF NEEDS Skin   Total Care Verbally Other (Comment) (redness noted to bilateral arms, legs and bottom)                       Personal Care Assistance Level of Assistance  Bathing, Feeding, Dressing Bathing Assistance: Maximum assistance Feeding assistance: Limited assistance Dressing Assistance: Maximum assistance     Functional Limitations Info  Sight, Hearing, Speech Sight Info: Adequate Hearing Info: Adequate Speech Info: Adequate    SPECIAL CARE FACTORS FREQUENCY  PT (By licensed PT), OT (By licensed OT)     PT Frequency: 5X/wk OT Frequency:  5X/wk            Contractures Contractures Info: Not present    Additional Factors Info  Code Status, Allergies, Psychotropic, Insulin Sliding Scale, Isolation Precautions, Suctioning Needs Code Status Info: DNR Allergies Info: Prednisone, Amlodipine,  Amoxicillin, Amoxicillin-pot Clavulanate, Lactose Intolerance (Gi), Limonene, Sulfa Antibiotics Psychotropic Info: n/a Insulin Sliding Scale Info: see discharge summary Isolation Precautions Info: n/a Suctioning Needs: n/a   Current Medications (10/30/2022):  This is the current hospital active medication list Current Facility-Administered Medications  Medication Dose Route Frequency Provider Last Rate Last Admin   acetaminophen (TYLENOL) tablet 650 mg  650 mg Oral Q6H PRN Opyd, Lavone Neri, MD   650 mg at 10/28/22 1756   Or   acetaminophen (TYLENOL) suppository 650 mg  650 mg Rectal Q6H PRN Opyd, Lavone Neri, MD       aspirin tablet 325 mg  325 mg Oral Daily Opyd, Lavone Neri, MD   325 mg at 10/30/22 0828   atorvastatin (LIPITOR) tablet 40 mg  40 mg Oral QPM Opyd, Lavone Neri, MD   40 mg at 10/29/22 1752   clonazePAM (KLONOPIN) tablet 0.5 mg  0.5 mg Oral BID Steffanie Rainwater, MD   0.5 mg at 10/30/22 7829   gabapentin (NEURONTIN) capsule 200 mg  200 mg Oral BID Dorcas Carrow, MD   200 mg at 10/30/22 0828   heparin injection 5,000 Units  5,000 Units Subcutaneous Q8H Opyd, Lavone Neri, MD   5,000 Units at 10/30/22 0602   lidocaine (LIDODERM) 5 % 1 patch  1 patch Transdermal Q24H Opyd, Lavone Neri, MD   1 patch at 10/30/22 0828   pantoprazole (PROTONIX) EC tablet 40 mg  40 mg Oral Daily Opyd, Lavone Neri, MD   40 mg at 10/30/22 5621   sertraline (ZOLOFT) tablet 50 mg  50 mg Oral Daily Dorcas Carrow, MD   50 mg at 10/30/22 3086   sodium chloride flush (NS) 0.9 % injection 3 mL  3 mL Intravenous Q12H Opyd, Lavone Neri, MD   3 mL at 10/30/22 5784     Discharge Medications: Please see discharge summary for a list of discharge medications.  Relevant Imaging Results:  Relevant Lab Results:   Additional Information SS# [id number]  Beckie Busing, RN

## 2022-12-15 DEATH — deceased
